# Patient Record
Sex: Male | Born: 1960 | Race: White | Hispanic: No | Smoking: Current every day smoker
Health system: Southern US, Community
[De-identification: ages and names within clinical notes are randomized; demographics above are authoritative.]

## PROBLEM LIST (undated history)

## (undated) DIAGNOSIS — L409 Psoriasis, unspecified: Secondary | ICD-10-CM

## (undated) DIAGNOSIS — F32A Depression, unspecified: Secondary | ICD-10-CM

## (undated) DIAGNOSIS — I509 Heart failure, unspecified: Secondary | ICD-10-CM

## (undated) DIAGNOSIS — Q2112 Patent foramen ovale: Secondary | ICD-10-CM

## (undated) DIAGNOSIS — Q211 Atrial septal defect: Secondary | ICD-10-CM

## (undated) DIAGNOSIS — I4892 Unspecified atrial flutter: Secondary | ICD-10-CM

## (undated) DIAGNOSIS — I499 Cardiac arrhythmia, unspecified: Secondary | ICD-10-CM

## (undated) DIAGNOSIS — J449 Chronic obstructive pulmonary disease, unspecified: Secondary | ICD-10-CM

## (undated) HISTORY — DX: Chronic obstructive pulmonary disease, unspecified: J44.9

## (undated) HISTORY — DX: Unspecified atrial flutter: I48.92

## (undated) HISTORY — DX: Depression, unspecified: F32.A

## (undated) HISTORY — DX: Atrial septal defect: Q21.1

## (undated) HISTORY — DX: Patent foramen ovale: Q21.12

## (undated) HISTORY — PX: HERNIA REPAIR: SHX51

## (undated) HISTORY — DX: Psoriasis, unspecified: L40.9

---

## 2006-03-31 ENCOUNTER — Ambulatory Visit (HOSPITAL_COMMUNITY): Admission: RE | Admit: 2006-03-31 | Discharge: 2006-03-31 | Payer: Self-pay | Admitting: General Surgery

## 2019-01-08 ENCOUNTER — Other Ambulatory Visit: Payer: Self-pay

## 2019-01-08 ENCOUNTER — Emergency Department (HOSPITAL_COMMUNITY): Payer: Self-pay

## 2019-01-08 ENCOUNTER — Ambulatory Visit
Admission: EM | Admit: 2019-01-08 | Discharge: 2019-01-08 | Disposition: A | Discharge status: Home / Self Care | Payer: Self-pay | Attending: Emergency Medicine | Admitting: Emergency Medicine

## 2019-01-08 ENCOUNTER — Observation Stay (HOSPITAL_COMMUNITY)
Admission: EM | Admit: 2019-01-08 | Discharge: 2019-01-10 | Disposition: A | Discharge status: Home / Self Care | Payer: Self-pay | Attending: Family Medicine | Admitting: Family Medicine

## 2019-01-08 ENCOUNTER — Encounter (HOSPITAL_COMMUNITY): Payer: Self-pay | Admitting: Emergency Medicine

## 2019-01-08 ENCOUNTER — Ambulatory Visit (INDEPENDENT_AMBULATORY_CARE_PROVIDER_SITE_OTHER): Payer: Self-pay

## 2019-01-08 DIAGNOSIS — J9601 Acute respiratory failure with hypoxia: Secondary | ICD-10-CM | POA: Insufficient documentation

## 2019-01-08 DIAGNOSIS — Z1159 Encounter for screening for other viral diseases: Secondary | ICD-10-CM | POA: Insufficient documentation

## 2019-01-08 DIAGNOSIS — J441 Chronic obstructive pulmonary disease with (acute) exacerbation: Secondary | ICD-10-CM | POA: Diagnosis present

## 2019-01-08 DIAGNOSIS — R05 Cough: Secondary | ICD-10-CM

## 2019-01-08 DIAGNOSIS — R0602 Shortness of breath: Secondary | ICD-10-CM

## 2019-01-08 DIAGNOSIS — F17210 Nicotine dependence, cigarettes, uncomplicated: Secondary | ICD-10-CM | POA: Insufficient documentation

## 2019-01-08 DIAGNOSIS — Z720 Tobacco use: Secondary | ICD-10-CM | POA: Diagnosis present

## 2019-01-08 DIAGNOSIS — W57XXXA Bitten or stung by nonvenomous insect and other nonvenomous arthropods, initial encounter: Secondary | ICD-10-CM

## 2019-01-08 DIAGNOSIS — R059 Cough, unspecified: Secondary | ICD-10-CM

## 2019-01-08 DIAGNOSIS — J209 Acute bronchitis, unspecified: Secondary | ICD-10-CM | POA: Diagnosis present

## 2019-01-08 DIAGNOSIS — R9389 Abnormal findings on diagnostic imaging of other specified body structures: Secondary | ICD-10-CM

## 2019-01-08 DIAGNOSIS — J9691 Respiratory failure, unspecified with hypoxia: Secondary | ICD-10-CM | POA: Diagnosis present

## 2019-01-08 DIAGNOSIS — F1010 Alcohol abuse, uncomplicated: Secondary | ICD-10-CM | POA: Diagnosis present

## 2019-01-08 DIAGNOSIS — R0902 Hypoxemia: Secondary | ICD-10-CM

## 2019-01-08 LAB — BASIC METABOLIC PANEL
Anion gap: 10 (ref 5–15)
BUN: 5 mg/dL — ABNORMAL LOW (ref 6–20)
CO2: 26 mmol/L (ref 22–32)
Calcium: 8.5 mg/dL — ABNORMAL LOW (ref 8.9–10.3)
Chloride: 94 mmol/L — ABNORMAL LOW (ref 98–111)
Creatinine, Ser: 0.47 mg/dL — ABNORMAL LOW (ref 0.61–1.24)
GFR calc Af Amer: >60 mL/min (ref >60)
GFR calc non Af Amer: >60 mL/min (ref >60)
Glucose, Bld: 111 mg/dL — ABNORMAL HIGH (ref 70–99)
Potassium: 3.6 mmol/L (ref 3.5–5.1)
Sodium: 130 mmol/L — ABNORMAL LOW (ref 135–145)

## 2019-01-08 LAB — CBC
HCT: 47.1 % (ref 39.0–52.0)
Hemoglobin: 16.5 g/dL (ref 13.0–17.0)
MCH: 36.1 pg — ABNORMAL HIGH (ref 26.0–34.0)
MCHC: 35 g/dL (ref 30.0–36.0)
MCV: 103.1 fL — ABNORMAL HIGH (ref 80.0–100.0)
Platelets: 278 10*3/uL (ref 150–400)
RBC: 4.57 MIL/uL (ref 4.22–5.81)
RDW: 12.5 % (ref 11.5–15.5)
WBC: 7.5 10*3/uL (ref 4.0–10.5)
nRBC: 0 % (ref 0.0–0.2)

## 2019-01-08 LAB — TROPONIN I: Troponin I: <0.03 ng/mL (ref <0.03)

## 2019-01-08 LAB — BRAIN NATRIURETIC PEPTIDE: B Natriuretic Peptide: 20 pg/mL (ref 0.0–100.0)

## 2019-01-08 LAB — SARS CORONAVIRUS 2 BY RT PCR (HOSPITAL ORDER, PERFORMED IN ~~LOC~~ HOSPITAL LAB): SARS Coronavirus 2: NEGATIVE

## 2019-01-08 MED ORDER — IPRATROPIUM-ALBUTEROL 0.5-2.5 (3) MG/3ML IN SOLN
3.0000 mL | Freq: Once | RESPIRATORY_TRACT | Status: AC
  Administered 2019-01-08: 3 mL via RESPIRATORY_TRACT
  Filled 2019-01-08: qty 3

## 2019-01-08 MED ORDER — IOHEXOL 350 MG/ML SOLN
100.0000 mL | Freq: Once | INTRAVENOUS | Status: AC | PRN
  Administered 2019-01-08: 100 mL via INTRAVENOUS

## 2019-01-08 MED ORDER — SODIUM CHLORIDE 0.9 % IV SOLN
500.0000 mg | Freq: Once | INTRAVENOUS | Status: AC
  Administered 2019-01-08: 23:00:00 500 mg via INTRAVENOUS
  Filled 2019-01-08: qty 500

## 2019-01-08 MED ORDER — ALBUTEROL SULFATE HFA 108 (90 BASE) MCG/ACT IN AERS
2.0000 | INHALATION_SPRAY | Freq: Once | RESPIRATORY_TRACT | Status: AC
  Administered 2019-01-08: 2 via RESPIRATORY_TRACT
  Filled 2019-01-08: qty 6.7

## 2019-01-08 MED ORDER — PREDNISONE 50 MG PO TABS
60.0000 mg | ORAL_TABLET | Freq: Once | ORAL | Status: AC
  Administered 2019-01-08: 60 mg via ORAL
  Filled 2019-01-08: qty 1

## 2019-01-08 MED ORDER — SODIUM CHLORIDE 0.9 % IV BOLUS
1000.0000 mL | Freq: Once | INTRAVENOUS | Status: AC
  Administered 2019-01-08: 1000 mL via INTRAVENOUS

## 2019-01-08 NOTE — ED Provider Notes (Signed)
[facility] Emergency Department Provider Note MRN:  [id number]  Arrival date & time: 01/08/19     Chief Complaint   Shortness of Breath   History of Present Illness   Jordan Kelly is a 58 y.o. year-old male with a history of tobacco abuse presenting to the ED with chief complaint of shortness of breath.  5 days of progressively worsening shortness of breath.  Worse at night, worse when waking up in the morning.  Worse with exertion.  Denies chest pain.  Endorsing sinus congestion and feeling like he has a sinus infection for the past few days.  Denies fever.  Mild cough, dry.  Denies abdominal pain, no leg pain or swelling.  Symptoms constant, moderate, no other exacerbating or relieving factors.  Chest x-ray performed at urgent care was abnormal, patient was reportedly hypoxic there, sent here for CT imaging.  Review of Systems  A complete 10 system review of systems was obtained and all systems are negative except as noted in the HPI and PMH.   Patient's Health History   History reviewed. No pertinent past medical history.  Past Surgical History:  Procedure Laterality Date  . HERNIA REPAIR      History reviewed. No pertinent family history.  Social History   Socioeconomic History  . Marital status: Divorced    Spouse name: Not on file  . Number of children: Not on file  . Years of education: Not on file  . Highest education level: Not on file  Occupational History  . Not on file  Social Needs  . Financial resource strain: Not on file  . Food insecurity:    Worry: Not on file    Inability: Not on file  . Transportation needs:    Medical: Not on file    Non-medical: Not on file  Tobacco Use  . Smoking status: Current Every Day Smoker    Packs/day: 1.00    Types: Cigarettes  . Smokeless tobacco: Never Used  Substance and Sexual Activity  . Alcohol use: Yes  . Drug use: Yes    Types: Marijuana  . Sexual activity: Not Currently  Lifestyle  .  Physical activity:    Days per week: Not on file    Minutes per session: Not on file  . Stress: Not on file  Relationships  . Social connections:    Talks on phone: Not on file    Gets together: Not on file    Attends religious service: Not on file    Active member of club or organization: Not on file    Attends meetings of clubs or organizations: Not on file    Relationship status: Not on file  . Intimate partner violence:    Fear of current or ex partner: Not on file    Emotionally abused: Not on file    Physically abused: Not on file    Forced sexual activity: Not on file  Other Topics Concern  . Not on file  Social History Narrative  . Not on file     Physical Exam  Vital Signs and Nursing Notes reviewed Vitals:   01/08/19 2118 01/08/19 2240  BP: 136/86 (!) 152/101  Pulse: 85 77  Resp: (!) 23 (!) 21  Temp:    SpO2: 92% 95%    CONSTITUTIONAL: Well-appearing, NAD NEURO:  Alert and oriented x 3, no focal deficits EYES:  eyes equal and reactive ENT/NECK:  no LAD, no JVD CARDIO: Regular rate, well-perfused, normal S1  and S2 PULM: Scattered wheezes, no increased work of breathing GI/GU:  normal bowel sounds, non-distended, non-tender MSK/SPINE:  No gross deformities, no edema SKIN:  no rash, atraumatic PSYCH:  Appropriate speech and behavior  Diagnostic and Interventional Summary    EKG Interpretation  Date/Time:  Friday Jan 08 2019 19:25:22 EDT Ventricular Rate:  84 PR Interval:    QRS Duration: 94 QT Interval:  372 QTC Calculation: 440 R Axis:   81 Text Interpretation:  Sinus rhythm Consider left atrial enlargement Minimal ST elevation, inferior leads Confirmed by Gerlene Fee [phone]) on 01/08/2019 9:15:03 PM      Labs Reviewed  CBC - Abnormal; Notable for the following components:      Result Value   MCV 103.1 (*)    MCH 36.1 (*)    All other components within normal limits  BASIC METABOLIC PANEL - Abnormal; Notable for the following components:    Sodium 130 (*)    Chloride 94 (*)    Glucose, Bld 111 (*)    BUN 5 (*)    Creatinine, Ser 0.47 (*)    Calcium 8.5 (*)    All other components within normal limits  SARS CORONAVIRUS 2 (HOSPITAL ORDER, [facility])  TROPONIN I  BRAIN NATRIURETIC PEPTIDE    CT ANGIO CHEST PE W OR WO CONTRAST  Final Result      Medications  ipratropium-albuterol (DUONEB) 0.5-2.5 (3) MG/3ML nebulizer solution 3 mL (has no administration in time range)  ipratropium-albuterol (DUONEB) 0.5-2.5 (3) MG/3ML nebulizer solution 3 mL (has no administration in time range)  azithromycin (ZITHROMAX) 500 mg in sodium chloride 0.9 % 250 mL IVPB (has no administration in time range)  albuterol (VENTOLIN HFA) 108 (90 Base) MCG/ACT inhaler 2 puff (2 puffs Inhalation Given 01/08/19 1936)  predniSONE (DELTASONE) tablet 60 mg (60 mg Oral Given 01/08/19 1936)  iohexol (OMNIPAQUE) 350 MG/ML injection 100 mL (100 mLs Intravenous Contrast Given 01/08/19 2132)     Procedures Critical Care Critical Care Documentation Critical care time provided by me (excluding procedures): 32 minutes  Condition necessitating critical care: COPD exacerbation with hypoxic respiratory failure  Components of critical care management: reviewing of prior records, laboratory and imaging interpretation, frequent re-examination and reassessment of vital signs, administration of DuoNeb nebulization therapy, IV azithromycin, IV fluids, supplemental oxygen, discussion with consulting services    ED Course and Medical Decision Making  I have reviewed the triage vital signs and the nursing notes.  Pertinent labs & imaging results that were available during my care of the patient were reviewed by me and considered in my medical decision making (see below for details).  Favoring COPD exacerbation in this 58 year old male with long-term smoking history, no formal diagnosis.  Also considering pneumonia, coronavirus, less likely  pulmonary embolism work-up pending.  Jordan Kelly was evaluated in Emergency Department on 01/08/2019 for the symptoms described in the history of present illness. He was evaluated in the context of the global COVID-19 pandemic, which necessitated consideration that the patient might be at risk for infection with the SARS-CoV-2 virus that causes COVID-19. Institutional protocols and algorithms that pertain to the evaluation of patients at risk for COVID-19 are in a state of rapid change based on information released by regulatory bodies including the CDC and federal and state organizations. These policies and algorithms were followed during the patient's care in the ED.  Work-up reveals hyponatremia with a sodium of 130, normal BNP, negative troponin, CTA without evidence of PE,  no clear evidence of pneumonia.  Suspect underlying and undiagnosed COPD with exacerbation.  Patient is now on 4 L nasal cannula to maintain saturations in the mid 90s.  Admitted to hospitalist service for further care.  Patient is COVID negative, can now provide DuoNeb nebulizer therapy.  Barth Kirks. [place]onia Small, [place] [email or web address]  Final Clinical Impressions(s) / ED Diagnoses     ICD-10-CM   1. COPD exacerbation ([place]) J44.1   2. SOB (shortness of breath) R06.02 CANCELED: DG Chest Port 1 View    CANCELED: DG Chest Port 1 View  3. Acute respiratory failure with hypoxia [facility]) J96.01     ED Discharge Orders    None         Maudie Flakes, MD 01/08/19 2250

## 2019-01-08 NOTE — ED Provider Notes (Signed)
[facility]   [id number] 01/08/19 Arrival Time: 1536   CC: nasal congestion, cough, SOB, and tick bite  SUBJECTIVE: History from: patient.  Jordan Kelly is a 58 y.o. male hx significant for tobacco use, 1 PPD x 20 years, who presents with nasal congestion x 2 weeks and nonproductive cough, and SOB x 3 days.  Denies sick exposure to COVID, flu or strep.  Denies recent travel.  Has tried OTC mucinex without relief.  Symptoms are made worse at night.  Reports previous symptoms in the past that improved with albuterol inhaler.   Complains of associated fatigue, rhinorrhea, sinus congestion/ pain, and wheezing.  Denies fever, chills, sore throat, chest pain, nausea, changes in bowel or bladder habits.    Removed tick from left lower extremity 2 days ago.  States it was not engorged, and was present for less than 72 hours.  Denies nausea, vomiting, dizziness, weakness, rash, or abdominal pain.    ROS: As per HPI.  History reviewed. No pertinent past medical history. Past Surgical History:  Procedure Laterality Date  . HERNIA REPAIR     No Known Allergies No current facility-administered medications on file prior to encounter.    No current outpatient medications on file prior to encounter.   Social History   Socioeconomic History  . Marital status: Divorced    Spouse name: Not on file  . Number of children: Not on file  . Years of education: Not on file  . Highest education level: Not on file  Occupational History  . Not on file  Social Needs  . Financial resource strain: Not on file  . Food insecurity:    Worry: Not on file    Inability: Not on file  . Transportation needs:    Medical: Not on file    Non-medical: Not on file  Tobacco Use  . Smoking status: Current Every Day Smoker    Packs/day: 1.00    Types: Cigarettes  . Smokeless tobacco: Never Used  Substance and Sexual Activity  . Alcohol use: Yes  . Drug use: Yes    Types: Marijuana  . Sexual  activity: Not Currently  Lifestyle  . Physical activity:    Days per week: Not on file    Minutes per session: Not on file  . Stress: Not on file  Relationships  . Social connections:    Talks on phone: Not on file    Gets together: Not on file    Attends religious service: Not on file    Active member of club or organization: Not on file    Attends meetings of clubs or organizations: Not on file    Relationship status: Not on file  . Intimate partner violence:    Fear of current or ex partner: Not on file    Emotionally abused: Not on file    Physically abused: Not on file    Forced sexual activity: Not on file  Other Topics Concern  . Not on file  Social History Narrative  . Not on file   History reviewed. No pertinent family history.  OBJECTIVE:  Vitals:   01/08/19 1553  BP: (!) 144/89  Pulse: 76  Resp: 20  Temp: 98.1 F (36.7 C)  SpO2: (!) 89%     General appearance: alert, appears older than stated age; fatigued, but nontoxic; speaking in full sentences and tolerating own secretions HEENT: NCAT; Ears: EACs clear, TMs pearly gray; Eyes: PERRL.  EOM grossly intact. Sinuses: NTTP; Nose:  nares patent with clear rhinorrhea, Throat: oropharynx clear, tonsils non erythematous or enlarged, uvula midline  Neck: supple without LAD Lungs: wheezes and rales heard throughout bilateral lungs; cough present Heart: regular rate and rhythm.  Radial pulses 2+ symmetrical bilaterally.  Cap refill <2 seconds Skin: warm and dry; small punctate lesion without erythema appreciated after removal.  Cleansed again with alcohol swab and band-aid placed. Psychological: alert and cooperative; normal mood and affect  DIAGNOSTIC STUDIES:  Dg Chest 2 View  Result Date: 01/08/2019 CLINICAL DATA:  Cough, fatigue, short of breath EXAM: CHEST - 2 VIEW COMPARISON:  None. FINDINGS: Normal cardiac silhouette. Intermediate density opacity projecting over the RIGHT costophrenic angle. Upper lungs are  clear. LEFT lung base clear. Lateral projection demonstrates no focal abnormality. Degenerate spurring of spine. IMPRESSION: RIGHT lateral lung base opacity represent scarring, pneumonia, or pleural lesion. Recommend clinical correlation and consider CT thorax. These results will be called to the ordering clinician or representative by the Radiologist Assistant, and communication documented in the PACS or zVision Dashboard. Electronically Signed   By: Suzy Bouchard M.D.   On: 01/08/2019 16:29     ASSESSMENT & PLAN:  1. Hypoxia   2. Cough   3. Shortness of breath   4. Tick bite, initial encounter   5. Abnormal chest x-ray    Recommending further evaluation and management in the ED for hypoxia and concerning findings on chest x-ray.  Radiologist is recommending CT scan.  Patient aware and in agreement with this plan.  Will go by private vehicle to [facility] ED.           Lestine Box, PA-C 01/08/19 1825

## 2019-01-08 NOTE — ED Notes (Signed)
Pt's O2 sat was 89% on RA. Placed pt on 2LPM via [place] with EDP approval at bedside. O2 sat % increased to 95%

## 2019-01-08 NOTE — ED Triage Notes (Signed)
Pt states that he has had nasal congestion for past 2 weeks and began having cough at night for past 3 days . States it is nonproductive

## 2019-01-08 NOTE — ED Notes (Signed)
Pt's O2 sat % was 88% when this RN walked into room. Increased O2 up to 4LPM, pt's O2 sat 95 %.

## 2019-01-08 NOTE — Discharge Instructions (Addendum)
Recommending further evaluation and management in the ED for hypoxia and concerning findings on chest x-ray.  Radiologist is recommending CT scan.  Patient aware and in agreement with this plan.  Will go by private vehicle to [facility] ED.

## 2019-01-08 NOTE — ED Triage Notes (Signed)
Patient was seen at the urgent care for headaches and shortness of breath. Urgent care sent patient here after a chest x-ray was done and recommended a CT scan. Urgent care stated that the patient may have possible pneumonia. Patient has low oxygen saturations at 91 percent on room air.

## 2019-01-08 NOTE — H&P (Addendum)
TRH H&P    Patient Demographics:    Jordan Kelly, is a 58 y.o. male  MRN: [id number]  DOB - August 06, 1961  Admit Date - 01/08/2019  Referring MD/NP/PA: Dr. Sedonia Small  Outpatient Primary MD for the patient is Patient, No Pcp Per  Patient coming from: Home  Chief complaint-shortness of breath   HPI:    Jordan Kelly  is a 58 y.o. male, with history of tobacco abuse, alcohol abuse came to ED with chief complaint of shortness of breath.  Patient states that for 5 days he has been having worsening shortness of breath also had nasal congestion.  Denies nausea vomiting or diarrhea.  Denies coughing up any phlegm.  Complains of mild dry cough.  Chest x-ray performed at urgent care was abnormal and was reportedly hypoxic so he was sent to the ED for CT imaging. Patient also complains of tick bite in left leg. In the ED CTA chest was negative for pulmonary embolism, showed small left pleural effusion with atelectasis and no consolidation. SARS-CoV-2 was negative Denies chest pain No previous history of stroke or seizures.    Review of systems:    In addition to the HPI above,    All other systems reviewed and are negative.    Past History of the following :    History reviewed. No pertinent past medical history.    Past Surgical History:  Procedure Laterality Date  . HERNIA REPAIR        Social History:      Social History   Tobacco Use  . Smoking status: Current Every Day Smoker    Packs/day: 1.00    Types: Cigarettes  . Smokeless tobacco: Never Used  Substance Use Topics  . Alcohol use: Yes       Family History :  Unremarkable   Home Medications:   Prior to Admission medications   Not on File     Allergies:    No Known Allergies   Physical Exam:   Vitals  Blood pressure (!) 152/101, pulse 77, temperature 98.2 F (36.8 C), temperature source Oral, resp. rate (!) 21, height 5\' 11"   (1.803 m), weight 81.6 kg, SpO2 92 %.  1.  General: Appears in no acute distress  2. Psychiatric: Alert, oriented x3, intact insight and judgment  3. Neurologic: Cranial nerves II through grossly intact, motor strength 5/5 in all extremities  4. HEENMT:  Atraumatic normocephalic, extraocular muscles are intact  5. Respiratory : Wheezing auscultated bilaterally  6. Cardiovascular : S1-S2, regular, no murmur auscultated  7. Gastrointestinal:  Abdomen is soft, nontender, no organomegaly  8. Skin:  Left lower extremity has small erythematous lesion      Data Review:    CBC Recent Labs  Lab 01/08/19 1941  WBC 7.5  HGB 16.5  HCT 47.1  PLT 278  MCV 103.1*  MCH 36.1*  MCHC 35.0  RDW 12.5   ------------------------------------------------------------------------------------------------------------------  Results for orders placed or performed during the hospital encounter of 01/08/19 (from the past 48 hour(s))  CBC  Status: Abnormal   Collection Time: 01/08/19  7:41 PM  Result Value Ref Range   WBC 7.5 4.0 - 10.5 K/uL   RBC 4.57 4.22 - 5.81 MIL/uL   Hemoglobin 16.5 13.0 - 17.0 g/dL   HCT 47.1 39.0 - 52.0 %   MCV 103.1 (H) 80.0 - 100.0 fL   MCH 36.1 (H) 26.0 - 34.0 pg   MCHC 35.0 30.0 - 36.0 g/dL   RDW 12.5 11.5 - 15.5 %   Platelets 278 150 - 400 K/uL   nRBC 0.0 0.0 - 0.2 %    Comment: Performed at [facility], [address], [place], [place] [address]  Basic metabolic panel     Status: Abnormal   Collection Time: 01/08/19  7:41 PM  Result Value Ref Range   Sodium 130 (L) 135 - 145 mmol/L   Potassium 3.6 3.5 - 5.1 mmol/L   Chloride 94 (L) 98 - 111 mmol/L   CO2 26 22 - 32 mmol/L   Glucose, Bld 111 (H) 70 - 99 mg/dL   BUN 5 (L) 6 - 20 mg/dL   Creatinine, Ser 0.47 (L) 0.61 - 1.24 mg/dL   Calcium 8.5 (L) 8.9 - 10.3 mg/dL   GFR calc non Af Amer >60 >60 mL/min   GFR calc Af Amer >60 >60 mL/min   Anion gap 10 5 - 15    Comment: Performed at [facility], [address], [place], [place] [address]  Troponin I - ONCE - STAT     Status: None   Collection Time: 01/08/19  7:41 PM  Result Value Ref Range   Troponin I <0.03 <0.03 ng/mL    Comment: Performed at [facility], [address] [place], [place] [address]  Brain natriuretic peptide     Status: None   Collection Time: 01/08/19  7:42 PM  Result Value Ref Range   B Natriuretic Peptide 20.0 0.0 - 100.0 pg/mL    Comment: Performed at [facility], [address], [place], [place] [address]  SARS Coronavirus 2 (CEPHEID- Performed in [facility] hospital lab), Hosp Order     Status: None   Collection Time: 01/08/19  7:44 PM  Result Value Ref Range   SARS Coronavirus 2 NEGATIVE NEGATIVE    Comment: (NOTE) If result is NEGATIVE SARS-CoV-2 target nucleic acids are NOT DETECTED. The SARS-CoV-2 RNA is generally detectable in upper and lower  respiratory specimens during the acute phase of infection. The lowest  concentration of SARS-CoV-2 viral copies this assay can detect is 250  copies / mL. A negative result does not preclude SARS-CoV-2 infection  and should not be used as the sole basis for treatment or other  patient management decisions.  A negative result may occur with  improper specimen collection / handling, submission of specimen other  than nasopharyngeal swab, presence of viral mutation(s) within the  areas targeted by this assay, and inadequate number of viral copies  (<250 copies / mL). A negative result must be combined with clinical  observations, patient history, and epidemiological information. If result is POSITIVE SARS-CoV-2 target nucleic acids are DETECTED. The SARS-CoV-2 RNA is generally detectable in upper and lower  respiratory specimens dur ing the acute phase of infection.  Positive  results are indicative of active infection with SARS-CoV-2.  Clinical  correlation with patient history and other diagnostic information is  necessary to determine patient  infection status.  Positive results do  not rule out bacterial infection or co-infection with other viruses. If  result is PRESUMPTIVE POSTIVE SARS-CoV-2 nucleic acids MAY BE PRESENT.   A presumptive positive result was obtained on the submitted specimen  and confirmed on repeat testing.  While 2019 novel coronavirus  (SARS-CoV-2) nucleic acids may be present in the submitted sample  additional confirmatory testing may be necessary for epidemiological  and / or clinical management purposes  to differentiate between  SARS-CoV-2 and other Sarbecovirus currently known to infect humans.  If clinically indicated additional testing with an alternate test  methodology [phone]) is advised. The SARS-CoV-2 RNA is generally  detectable in upper and lower respiratory sp ecimens during the acute  phase of infection. The expected result is Negative. Fact Sheet for Patients:  StrictlyIdeas.no Fact Sheet for Healthcare Providers: BankingDealers.co.za This test is not yet approved or cleared by the [place] FDA and has been authorized for detection and/or diagnosis of SARS-CoV-2 by FDA under an Emergency Use Authorization (EUA).  This EUA will remain in effect (meaning this test can be used) for the duration of the COVID-19 declaration under Section 564(b)(1) of the Act, 21 U.S.C. section 360bbb-3(b)(1), unless the authorization is terminated or revoked sooner. Performed at [facility], [address] [place], [place] [address]     Chemistries  Recent Labs  Lab 01/08/19 1941  NA 130*  K 3.6  CL 94*  CO2 26  GLUCOSE 111*  BUN 5*  CREATININE 0.47*  CALCIUM 8.5*   ------------------------------------------------------------------------------------------------------------------  ------------------------------------------------------------------------------------------------------------------ GFR: Estimated Creatinine Clearance: 111.1  mL/min (A) (by C-G formula based on SCr of 0.47 mg/dL (L)). Liver Function Tests: No results for input(s): AST, ALT, ALKPHOS, BILITOT, PROT, ALBUMIN in the last 168 hours. No results for input(s): LIPASE, AMYLASE in the last 168 hours. No results for input(s): AMMONIA in the last 168 hours. Coagulation Profile: No results for input(s): INR, PROTIME in the last 168 hours. Cardiac Enzymes: Recent Labs  Lab 01/08/19 1941  TROPONINI <0.03      Imaging Results:    Dg Chest 2 View  Result Date: 01/08/2019 CLINICAL DATA:  Cough, fatigue, short of breath EXAM: CHEST - 2 VIEW COMPARISON:  None. FINDINGS: Normal cardiac silhouette. Intermediate density opacity projecting over the RIGHT costophrenic angle. Upper lungs are clear. LEFT lung base clear. Lateral projection demonstrates no focal abnormality. Degenerate spurring of spine. IMPRESSION: RIGHT lateral lung base opacity represent scarring, pneumonia, or pleural lesion. Recommend clinical correlation and consider CT thorax. These results will be called to the ordering clinician or representative by the Radiologist Assistant, and communication documented in the PACS or zVision Dashboard. Electronically Signed   By: Suzy Bouchard M.D.   On: 01/08/2019 16:29   Ct Angio Chest Pe W Or Wo Contrast  Result Date: 01/08/2019 CLINICAL DATA:  Headaches and shortness of breath with hypoxia EXAM: CT ANGIOGRAPHY CHEST WITH CONTRAST TECHNIQUE: Multidetector CT imaging of the chest was performed using the standard protocol during bolus administration of intravenous contrast. Multiplanar CT image reconstructions and MIPs were obtained to evaluate the vascular anatomy. CONTRAST:  163mL OMNIPAQUE IOHEXOL 350 MG/ML SOLN COMPARISON:  None. FINDINGS: Cardiovascular: --Pulmonary arteries: Contrast injection is sufficient to demonstrate satisfactory opacification of the pulmonary arteries to the segmental level, with attenuation of 325 HU at the main pulmonary artery.  There is no pulmonary embolus. The main pulmonary artery is within normal limits for size. --Aorta: Satisfactory opacification of the thoracic aorta. No aortic dissection or other acute aortic syndrome. Conventional 3 vessel aortic branching pattern. There is no aortic atherosclerosis. --Heart: Normal size. No  pericardial effusion. Mediastinum/Nodes: No mediastinal, hilar or axillary lymphadenopathy. The visualized thyroid and thoracic esophageal course are unremarkable. Lungs/Pleura: Small right basilar pleural effusion. No focal consolidation. No pneumothorax. 4 mm left lung base nodule. Upper Abdomen: Contrast bolus timing is not optimized for evaluation of the abdominal organs. The visualized portions of the organs of the upper abdomen are normal. Musculoskeletal: No chest wall abnormality. No bony spinal canal stenosis. Review of the MIP images confirms the above findings. IMPRESSION: 1. No pulmonary embolus or acute aortic syndrome. 2. Small right pleural effusion with basilar atelectasis. 3. 3 mm left lung base pulmonary nodule. No follow-up needed if patient is low-risk. Non-contrast chest CT can be considered in 12 months if patient is high-risk. This recommendation follows the consensus statement: Guidelines for Management of Incidental Pulmonary Nodules Detected on CT Images: From the Fleischner Society 2017; Radiology 2017; 284:228-243. Electronically Signed   By: Ulyses Jarred M.D.   On: 01/08/2019 22:42    My personal review of EKG: Rhythm NSR, nonspecific ST changes in leads II, III and aVF   Assessment & Plan:    Active Problems:   Acute bronchitis   1. Acute bronchitis-patient presenting with symptoms of acute bronchitis, nasal congestion.  May have underlying COPD.  He has longstanding history of tobacco abuse. Will check respiratory virus panel. Will start Solumedrol 40 mg IV Q 6 hr, Duoneb q 6 hrs, will start Doxycycline 100 mg po bid.  2. Tick bite- has small erythematous lesion in  left leg.No signs and symptoms of RMSF, started on Doxycycline.  3. Alcohol abuse- will start CIWA protocol   DVT Prophylaxis-   Lovenox   AM Labs Ordered, also please review Full Orders  Family Communication: Admission, patients condition and plan of care including tests being ordered have been discussed with the patient  who indicate understanding and agree with the plan and Code Status.  Code Status: Full code  Admission status: Observation: Based on patients clinical presentation and evaluation of above clinical data, I have made determination that patient meets Inpatient criteria at this time.  Time spent in minutes : 60 minutes   Oswald Hillock M.D on 01/08/2019 at 11:32 PM         ;

## 2019-01-09 ENCOUNTER — Encounter (HOSPITAL_COMMUNITY): Payer: Self-pay | Admitting: *Deleted

## 2019-01-09 ENCOUNTER — Other Ambulatory Visit: Payer: Self-pay

## 2019-01-09 DIAGNOSIS — J441 Chronic obstructive pulmonary disease with (acute) exacerbation: Secondary | ICD-10-CM | POA: Diagnosis present

## 2019-01-09 DIAGNOSIS — F1010 Alcohol abuse, uncomplicated: Secondary | ICD-10-CM | POA: Diagnosis present

## 2019-01-09 DIAGNOSIS — J440 Chronic obstructive pulmonary disease with acute lower respiratory infection: Secondary | ICD-10-CM

## 2019-01-09 DIAGNOSIS — J209 Acute bronchitis, unspecified: Secondary | ICD-10-CM | POA: Diagnosis present

## 2019-01-09 DIAGNOSIS — J9691 Respiratory failure, unspecified with hypoxia: Secondary | ICD-10-CM | POA: Diagnosis present

## 2019-01-09 DIAGNOSIS — Z720 Tobacco use: Secondary | ICD-10-CM | POA: Diagnosis present

## 2019-01-09 LAB — RESPIRATORY PANEL BY PCR

## 2019-01-09 LAB — COMPREHENSIVE METABOLIC PANEL
ALT: 29 U/L (ref 0–44)
AST: 23 U/L (ref 15–41)
Albumin: 3.6 g/dL (ref 3.5–5.0)
Alkaline Phosphatase: 108 U/L (ref 38–126)
Anion gap: 14 (ref 5–15)
BUN: 6 mg/dL (ref 6–20)
CO2: 25 mmol/L (ref 22–32)
Calcium: 8.7 mg/dL — ABNORMAL LOW (ref 8.9–10.3)
Chloride: 100 mmol/L (ref 98–111)
Creatinine, Ser: 0.5 mg/dL — ABNORMAL LOW (ref 0.61–1.24)
GFR calc Af Amer: >60 mL/min (ref >60)
GFR calc non Af Amer: >60 mL/min (ref >60)
Glucose, Bld: 146 mg/dL — ABNORMAL HIGH (ref 70–99)
Potassium: 4.3 mmol/L (ref 3.5–5.1)
Sodium: 139 mmol/L (ref 135–145)
Total Bilirubin: 0.7 mg/dL (ref 0.3–1.2)
Total Protein: 7.4 g/dL (ref 6.5–8.1)

## 2019-01-09 LAB — CBC
HCT: 51 % (ref 39.0–52.0)
Hemoglobin: 16.8 g/dL (ref 13.0–17.0)
MCH: 34.3 pg — ABNORMAL HIGH (ref 26.0–34.0)
MCHC: 32.9 g/dL (ref 30.0–36.0)
MCV: 104.1 fL — ABNORMAL HIGH (ref 80.0–100.0)
Platelets: 269 10*3/uL (ref 150–400)
RBC: 4.9 MIL/uL (ref 4.22–5.81)
RDW: 12.6 % (ref 11.5–15.5)
WBC: 4.5 10*3/uL (ref 4.0–10.5)
nRBC: 0 % (ref 0.0–0.2)

## 2019-01-09 MED ORDER — LORAZEPAM 1 MG PO TABS: 1.0000 mg | ORAL_TABLET | Freq: Four times a day (QID) | ORAL | Status: DC | PRN

## 2019-01-09 MED ORDER — ALBUTEROL SULFATE (2.5 MG/3ML) 0.083% IN NEBU: 2.5000 mg | INHALATION_SOLUTION | RESPIRATORY_TRACT | Status: DC | PRN

## 2019-01-09 MED ORDER — LORAZEPAM 1 MG PO TABS
1.0000 mg | ORAL_TABLET | Freq: Once | ORAL | Status: AC
  Administered 2019-01-09: 1 mg via ORAL
  Filled 2019-01-09: qty 1

## 2019-01-09 MED ORDER — THIAMINE HCL 100 MG/ML IJ SOLN: 100.0000 mg | Freq: Every day | INTRAMUSCULAR | Status: DC

## 2019-01-09 MED ORDER — METHYLPREDNISOLONE SODIUM SUCC 40 MG IJ SOLR
40.0000 mg | Freq: Two times a day (BID) | INTRAMUSCULAR | Status: DC
  Administered 2019-01-09: 40 mg via INTRAVENOUS
  Filled 2019-01-09: qty 1

## 2019-01-09 MED ORDER — DOXYCYCLINE HYCLATE 100 MG PO TABS
100.0000 mg | ORAL_TABLET | Freq: Two times a day (BID) | ORAL | Status: DC
  Administered 2019-01-09 – 2019-01-10 (×4): 100 mg via ORAL
  Filled 2019-01-09 (×4): qty 1

## 2019-01-09 MED ORDER — METHYLPREDNISOLONE SODIUM SUCC 40 MG IJ SOLR
40.0000 mg | Freq: Four times a day (QID) | INTRAMUSCULAR | Status: DC
  Administered 2019-01-09 (×3): 40 mg via INTRAVENOUS
  Filled 2019-01-09 (×3): qty 1

## 2019-01-09 MED ORDER — FOLIC ACID 1 MG PO TABS
1.0000 mg | ORAL_TABLET | Freq: Every day | ORAL | Status: DC
  Administered 2019-01-09 – 2019-01-10 (×2): 1 mg via ORAL
  Filled 2019-01-09 (×2): qty 1

## 2019-01-09 MED ORDER — VITAMIN B-1 100 MG PO TABS
100.0000 mg | ORAL_TABLET | Freq: Every day | ORAL | Status: DC
  Administered 2019-01-09 – 2019-01-10 (×2): 100 mg via ORAL
  Filled 2019-01-09 (×2): qty 1

## 2019-01-09 MED ORDER — IPRATROPIUM BROMIDE 0.02 % IN SOLN
0.5000 mg | Freq: Four times a day (QID) | RESPIRATORY_TRACT | Status: DC
  Administered 2019-01-09: 0.5 mg via RESPIRATORY_TRACT
  Filled 2019-01-09: qty 2.5

## 2019-01-09 MED ORDER — IPRATROPIUM-ALBUTEROL 0.5-2.5 (3) MG/3ML IN SOLN
3.0000 mL | Freq: Four times a day (QID) | RESPIRATORY_TRACT | Status: DC
  Administered 2019-01-09 – 2019-01-10 (×5): 3 mL via RESPIRATORY_TRACT
  Filled 2019-01-09 (×5): qty 3

## 2019-01-09 MED ORDER — SODIUM CHLORIDE 0.9 % IV SOLN
INTRAVENOUS | Status: DC
  Administered 2019-01-09: 01:00:00 via INTRAVENOUS

## 2019-01-09 MED ORDER — ENOXAPARIN SODIUM 40 MG/0.4ML ~~LOC~~ SOLN
40.0000 mg | SUBCUTANEOUS | Status: DC
  Administered 2019-01-09 – 2019-01-10 (×2): 40 mg via SUBCUTANEOUS
  Filled 2019-01-09 (×3): qty 0.4

## 2019-01-09 MED ORDER — ADULT MULTIVITAMIN W/MINERALS CH
1.0000 | ORAL_TABLET | Freq: Every day | ORAL | Status: DC
  Administered 2019-01-09 – 2019-01-10 (×2): 1 via ORAL
  Filled 2019-01-09 (×2): qty 1

## 2019-01-09 MED ORDER — GUAIFENESIN ER 600 MG PO TB12
600.0000 mg | ORAL_TABLET | Freq: Two times a day (BID) | ORAL | Status: DC
  Administered 2019-01-09 – 2019-01-10 (×4): 600 mg via ORAL
  Filled 2019-01-09 (×4): qty 1

## 2019-01-09 MED ORDER — ALBUTEROL SULFATE (2.5 MG/3ML) 0.083% IN NEBU
2.5000 mg | INHALATION_SOLUTION | Freq: Four times a day (QID) | RESPIRATORY_TRACT | Status: DC
  Administered 2019-01-09: 2.5 mg via RESPIRATORY_TRACT
  Filled 2019-01-09: qty 3

## 2019-01-09 MED ORDER — LORAZEPAM 2 MG/ML IJ SOLN: 1.0000 mg | Freq: Four times a day (QID) | INTRAMUSCULAR | Status: DC | PRN

## 2019-01-09 NOTE — Progress Notes (Addendum)
MD took off patient's O2 and wanted nursing staff to check his sat's. Initially it was 87% and then came up to 90% on room air just at rest. MD notified and stated not to try and ambulate patient. O2 re-applied.

## 2019-01-09 NOTE — Progress Notes (Signed)
Sputum cup placed in room

## 2019-01-09 NOTE — Progress Notes (Signed)
Patient Demographics:    Jordan Kelly, is a 58 y.o. male, DOB - [birth year], UTM:[id number]  Admit date - 01/08/2019   Admitting Physician Oswald Hillock, MD  Outpatient Primary MD for the patient is Patient, No Pcp Per  LOS - 0   Chief Complaint  Patient presents with  . Shortness of Breath        Subjective:    Jordan Kelly today has no fevers, no emesis,  No chest pain, cough, congestion and shortness of breath persist, unable to wean patient off oxygen,  Assessment  & Plan :    Principal Problem:   COPD with acute bronchitis/rhinovirus Active Problems:   COPD with acute exacerbation (HCC)   Tobacco abuse   Alcohol abuse   Acute Respiratory failure with hypoxia [facility])  Brief Summary- 58 y.o. male, with history of tobacco abuse, alcohol abuse admitted with respiratory symptoms and hypoxia on 01/08/2019  A/p 1)Acute COPD Exacerbation- no definite pneumonia,  treat empirically with IV Solu-Medrol   give mucinex, c/n doxycycline and bronchodilators as ordered, supplemental oxygen as ordered.   2)Acute Hypoxic Resp Failure--- unable to wean off oxygen at this time, hypoxia secondary to #1 above  3)Alcohol abuse--- lorazepam per CIWA protocol, continue folic acid and multivitamin  4)Tobacco and THC abuse--- patient with almost 40 pack years, is not ready to quit smoking  Disposition/Need for in-Hospital Stay- patient unable to be discharged at this time due to persistent respiratory symptoms and hypoxia  Code Status : FULL  Family Communication:   na   Disposition Plan  : home  Consults  :  na   DVT Prophylaxis  :  Lovenox   Lab Results  Component Value Date   PLT 269 01/09/2019    Inpatient Medications  Scheduled Meds: . doxycycline  100 mg Oral Q12H  . enoxaparin (LOVENOX) injection  40 mg Subcutaneous Q24H  . folic acid  1 mg Oral Daily  . guaiFENesin  600 mg Oral BID  .  ipratropium-albuterol  3 mL Nebulization Q6H  . [START ON 01/10/2019] methylPREDNISolone (SOLU-MEDROL) injection  40 mg Intravenous Q12H  . multivitamin with minerals  1 tablet Oral Daily  . thiamine  100 mg Oral Daily   Or  . thiamine  100 mg Intravenous Daily   Continuous Infusions: . sodium chloride 10 mL/hr at 01/09/19 0300   PRN Meds:.albuterol, LORazepam **OR** LORazepam    Anti-infectives (From admission, onward)   Start     Dose/Rate Route Frequency Ordered Stop   01/09/19 0015  doxycycline (VIBRA-TABS) tablet 100 mg     100 mg Oral Every 12 hours 01/09/19 0008     01/08/19 2300  azithromycin (ZITHROMAX) 500 mg in sodium chloride 0.9 % 250 mL IVPB     500 mg 250 mL/hr over 60 Minutes Intravenous  Once 01/08/19 2246 01/09/19 0002        Objective:   Vitals:   01/09/19 1022 01/09/19 1200 01/09/19 1238 01/09/19 1402  BP:  127/68 121/69   Pulse:  (!) 102 (!) 102   Resp:  20 20   Temp:  98 F (36.7 C) 98 F (36.7 C)   TempSrc:  Oral Oral   SpO2: (!) 87% 90% (!) 87% 90%  Weight:  Height:        Wt Readings from Last 3 Encounters:  01/09/19 78.3 kg     Intake/Output Summary (Last 24 hours) at 01/09/2019 1516 Last data filed at 01/09/2019 1300 Gross per 24 hour  Intake 859.65 ml  Output 2500 ml  Net -1640.35 ml     Physical Exam Patient is examined daily including today on 01/09/19 , exams remain the same as of yesterday except that has changed   Gen:- Awake Alert,  In no apparent distress  HEENT:- [place].AT, No sclera icterus Nose- [place] 2 L/min Neck-Supple Neck,No JVD,.  Lungs-diminished breath sounds with scattered wheezes  CV- S1, S2 normal, regular  Abd-  +ve B.Sounds, Abd Soft, No tenderness,    Extremity/Skin:- No  edema, pedal pulses present  Psych-affect is appropriate, oriented x3 Neuro-no new focal deficits, no tremors   Data Review:   Micro Results Recent Results (from the past 240 hour(s))  SARS Coronavirus 2 (CEPHEID- Performed in [place] hospital lab), Hosp Order     Status: None   Collection Time: 01/08/19  7:44 PM  Result Value Ref Range Status   SARS Coronavirus 2 NEGATIVE NEGATIVE Final    Comment: (NOTE) If result is NEGATIVE SARS-CoV-2 target nucleic acids are NOT DETECTED. The SARS-CoV-2 RNA is generally detectable in upper and lower  respiratory specimens during the acute phase of infection. The lowest  concentration of SARS-CoV-2 viral copies this assay can detect is 250  copies / mL. A negative result does not preclude SARS-CoV-2 infection  and should not be used as the sole basis for treatment or other  patient management decisions.  A negative result may occur with  improper specimen collection / handling, submission of specimen other  than nasopharyngeal swab, presence of viral mutation(s) within the  areas targeted by this assay, and inadequate number of viral copies  (<250 copies / mL). A negative result must be combined with clinical  observations, patient history, and epidemiological information. If result is POSITIVE SARS-CoV-2 target nucleic acids are DETECTED. The SARS-CoV-2 RNA is generally detectable in upper and lower  respiratory specimens dur ing the acute phase of infection.  Positive  results are indicative of active infection with SARS-CoV-2.  Clinical  correlation with patient history and other diagnostic information is  necessary to determine patient infection status.  Positive results do  not rule out bacterial infection or co-infection with other viruses. If result is PRESUMPTIVE POSTIVE SARS-CoV-2 nucleic acids MAY BE PRESENT.   A presumptive positive result was obtained on the submitted specimen  and confirmed on repeat testing.  While 2019 novel coronavirus  (SARS-CoV-2) nucleic acids may be present in the submitted sample  additional confirmatory testing may be necessary for epidemiological  and / or clinical management purposes  to differentiate between  SARS-CoV-2 and  other Sarbecovirus currently known to infect humans.  If clinically indicated additional testing with an alternate test  methodology [phone]) is advised. The SARS-CoV-2 RNA is generally  detectable in upper and lower respiratory sp ecimens during the acute  phase of infection. The expected result is Negative. Fact Sheet for Patients:  StrictlyIdeas.no Fact Sheet for Healthcare Providers: BankingDealers.co.za This test is not yet approved or cleared by the [place] FDA and has been authorized for detection and/or diagnosis of SARS-CoV-2 by FDA under an Emergency Use Authorization (EUA).  This EUA will remain in effect (meaning this test can be used) for the duration of the COVID-19 declaration under Section 564(b)(1) of the  Act, 21 U.S.C. section 360bbb-3(b)(1), unless the authorization is terminated or revoked sooner. Performed at [facility], [address] [place], [place] [address]   Respiratory Panel by PCR     Status: Abnormal   Collection Time: 01/09/19 12:54 AM  Result Value Ref Range Status   Adenovirus NOT DETECTED NOT DETECTED Final   Coronavirus 229E NOT DETECTED NOT DETECTED Final    Comment: (NOTE) The Coronavirus on the Respiratory Panel, DOES NOT test for the novel  Coronavirus (2019 nCoV)    Coronavirus HKU1 NOT DETECTED NOT DETECTED Final   Coronavirus NL63 NOT DETECTED NOT DETECTED Final   Coronavirus OC43 NOT DETECTED NOT DETECTED Final   Metapneumovirus NOT DETECTED NOT DETECTED Final   Rhinovirus / Enterovirus DETECTED (A) NOT DETECTED Final   Influenza A NOT DETECTED NOT DETECTED Final   Influenza B NOT DETECTED NOT DETECTED Final   Parainfluenza Virus 1 NOT DETECTED NOT DETECTED Final   Parainfluenza Virus 2 NOT DETECTED NOT DETECTED Final   Parainfluenza Virus 3 NOT DETECTED NOT DETECTED Final   Parainfluenza Virus 4 NOT DETECTED NOT DETECTED Final   Respiratory Syncytial Virus NOT DETECTED NOT  DETECTED Final   Bordetella pertussis NOT DETECTED NOT DETECTED Final   Chlamydophila pneumoniae NOT DETECTED NOT DETECTED Final   Mycoplasma pneumoniae NOT DETECTED NOT DETECTED Final    Comment: Performed at [facility], [place]. [address] [place], [place] [address]    Radiology Reports Dg Chest 2 View  Result Date: 01/08/2019 CLINICAL DATA:  Cough, fatigue, short of breath EXAM: CHEST - 2 VIEW COMPARISON:  None. FINDINGS: Normal cardiac silhouette. Intermediate density opacity projecting over the RIGHT costophrenic angle. Upper lungs are clear. LEFT lung base clear. Lateral projection demonstrates no focal abnormality. Degenerate spurring of spine. IMPRESSION: RIGHT lateral lung base opacity represent scarring, pneumonia, or pleural lesion. Recommend clinical correlation and consider CT thorax. These results will be called to the ordering clinician or representative by the Radiologist Assistant, and communication documented in the PACS or zVision Dashboard. Electronically Signed   By: Suzy Bouchard M.D.   On: 01/08/2019 16:29   Ct Angio Chest Pe W Or Wo Contrast  Result Date: 01/08/2019 CLINICAL DATA:  Headaches and shortness of breath with hypoxia EXAM: CT ANGIOGRAPHY CHEST WITH CONTRAST TECHNIQUE: Multidetector CT imaging of the chest was performed using the standard protocol during bolus administration of intravenous contrast. Multiplanar CT image reconstructions and MIPs were obtained to evaluate the vascular anatomy. CONTRAST:  188mL OMNIPAQUE IOHEXOL 350 MG/ML SOLN COMPARISON:  None. FINDINGS: Cardiovascular: --Pulmonary arteries: Contrast injection is sufficient to demonstrate satisfactory opacification of the pulmonary arteries to the segmental level, with attenuation of 325 HU at the main pulmonary artery. There is no pulmonary embolus. The main pulmonary artery is within normal limits for size. --Aorta: Satisfactory opacification of the thoracic aorta. No aortic dissection or other  acute aortic syndrome. Conventional 3 vessel aortic branching pattern. There is no aortic atherosclerosis. --Heart: Normal size. No pericardial effusion. Mediastinum/Nodes: No mediastinal, hilar or axillary lymphadenopathy. The visualized thyroid and thoracic esophageal course are unremarkable. Lungs/Pleura: Small right basilar pleural effusion. No focal consolidation. No pneumothorax. 4 mm left lung base nodule. Upper Abdomen: Contrast bolus timing is not optimized for evaluation of the abdominal organs. The visualized portions of the organs of the upper abdomen are normal. Musculoskeletal: No chest wall abnormality. No bony spinal canal stenosis. Review of the MIP images confirms the above findings. IMPRESSION: 1. No pulmonary embolus or acute aortic  syndrome. 2. Small right pleural effusion with basilar atelectasis. 3. 3 mm left lung base pulmonary nodule. No follow-up needed if patient is low-risk. Non-contrast chest CT can be considered in 12 months if patient is high-risk. This recommendation follows the consensus statement: Guidelines for Management of Incidental Pulmonary Nodules Detected on CT Images: From the Fleischner Society 2017; Radiology 2017; 284:228-243. Electronically Signed   By: Ulyses Jarred M.D.   On: 01/08/2019 22:42     CBC Recent Labs  Lab 01/08/19 1941 01/09/19 0538  WBC 7.5 4.5  HGB 16.5 16.8  HCT 47.1 51.0  PLT 278 269  MCV 103.1* 104.1*  MCH 36.1* 34.3*  MCHC 35.0 32.9  RDW 12.5 12.6    Chemistries  Recent Labs  Lab 01/08/19 1941 01/09/19 0538  NA 130* 139  K 3.6 4.3  CL 94* 100  CO2 26 25  GLUCOSE 111* 146*  BUN 5* 6  CREATININE 0.47* 0.50*  CALCIUM 8.5* 8.7*  AST  --  23  ALT  --  29  ALKPHOS  --  108  BILITOT  --  0.7   ------------------------------------------------------------------------------------------------------------------ No results for input(s): CHOL, HDL, LDLCALC, TRIG, CHOLHDL, LDLDIRECT in the last 72 hours.  No results found for:  HGBA1C ------------------------------------------------------------------------------------------------------------------ No results for input(s): TSH, T4TOTAL, T3FREE, THYROIDAB in the last 72 hours.  Invalid input(s): FREET3 ------------------------------------------------------------------------------------------------------------------ No results for input(s): VITAMINB12, FOLATE, FERRITIN, TIBC, IRON, RETICCTPCT in the last 72 hours.  Coagulation profile No results for input(s): INR, PROTIME in the last 168 hours.  No results for input(s): DDIMER in the last 72 hours.  Cardiac Enzymes Recent Labs  Lab 01/08/19 1941  TROPONINI <0.03   ------------------------------------------------------------------------------------------------------------------    Component Value Date/Time   BNP 20.0 01/08/2019 1942     Jordan Kelly M.D on 01/09/2019 at 3:16 PM  Go to [email or web address] - for contact info  [facility] - Office  [phone]

## 2019-01-10 LAB — HIV ANTIBODY (ROUTINE TESTING W REFLEX): HIV Screen 4th Generation wRfx: NONREACTIVE

## 2019-01-10 MED ORDER — FOLIC ACID 1 MG PO TABS: 1.0000 mg | ORAL_TABLET | Freq: Every day | ORAL | 3 refills | Status: DC

## 2019-01-10 MED ORDER — GUAIFENESIN ER 600 MG PO TB12: 600.0000 mg | ORAL_TABLET | Freq: Two times a day (BID) | ORAL | 0 refills | Status: DC

## 2019-01-10 MED ORDER — PREDNISONE 20 MG PO TABS: 40.0000 mg | ORAL_TABLET | Freq: Every day | ORAL | 0 refills | Status: DC

## 2019-01-10 MED ORDER — PREDNISONE 20 MG PO TABS
50.0000 mg | ORAL_TABLET | Freq: Once | ORAL | Status: AC
  Administered 2019-01-10: 50 mg via ORAL
  Filled 2019-01-10: qty 2

## 2019-01-10 MED ORDER — TIOTROPIUM BROMIDE MONOHYDRATE 18 MCG IN CAPS: 18.0000 ug | ORAL_CAPSULE | Freq: Every day | RESPIRATORY_TRACT | 2 refills | Status: DC

## 2019-01-10 MED ORDER — ALBUTEROL SULFATE HFA 108 (90 BASE) MCG/ACT IN AERS: 2.0000 | INHALATION_SPRAY | Freq: Four times a day (QID) | RESPIRATORY_TRACT | 2 refills | Status: DC | PRN

## 2019-01-10 MED ORDER — ADULT MULTIVITAMIN W/MINERALS CH: 1.0000 | ORAL_TABLET | Freq: Every day | ORAL | 3 refills | Status: AC

## 2019-01-10 NOTE — Discharge Instructions (Signed)
1) smoking cessation strongly advised 2) significant reduction of alcohol intake advised 3) use inhalers as prescribed--- for COPD (chronic obstructive pulmonary disease) and breathing problems 4) please follow-up with your primary care physician in a week for recheck

## 2019-01-10 NOTE — Discharge Summary (Signed)
Jordan Kelly, is a 58 y.o. male  DOB [birth year]  MRN [id number].  Admission date:  01/08/2019  Admitting Physician  Oswald Hillock, MD  Discharge Date:  01/10/2019   Primary MD  Patient, No Pcp Per  Recommendations for primary care physician for things to follow:   1) smoking cessation strongly advised 2) significant reduction of alcohol intake advised 3) use inhalers as prescribed--- for COPD (chronic obstructive pulmonary disease) and breathing problems 4) please follow-up with your primary care physician in a week for recheck   Admission Diagnosis  SOB (shortness of breath) [R06.02] COPD exacerbation ([place]) [J44.1] Acute respiratory failure with hypoxia ([place]) [J96.01]   Discharge Diagnosis  SOB (shortness of breath) [R06.02] COPD exacerbation ([facility]) [J44.1] Acute respiratory failure with hypoxia ([place]) [J96.01]    Principal Problem:   COPD with acute bronchitis/rhinovirus Active Problems:   COPD with acute exacerbation ([place])   Tobacco abuse   Alcohol abuse   Acute Respiratory failure with hypoxia ([place])      History reviewed. No pertinent past medical history.  Past Surgical History:  Procedure Laterality Date   HERNIA REPAIR         HPI  from the history and physical done on the day of admission:    Jordan Kelly  is a 58 y.o. male, with history of tobacco abuse, alcohol abuse came to ED with chief complaint of shortness of breath.  Patient states that for 5 days he has been having worsening shortness of breath also had nasal congestion.  Denies nausea vomiting or diarrhea.  Denies coughing up any phlegm.  Complains of mild dry cough.  Chest x-ray performed at urgent care was abnormal and was reportedly hypoxic so he was sent to the ED for CT imaging. Patient also complains of tick bite in left leg. In the ED CTA chest was negative for pulmonary embolism, showed small left pleural effusion  with atelectasis and no consolidation. SARS-CoV-2 was negative Denies chest pain No previous history of stroke or seizures.    Hospital Course:       Brief Summary- 58 y.o.male,with history of tobacco abuse, alcohol abuse admitted with respiratory symptoms and hypoxia on 01/08/2019  A/p 1)Acute COPD Exacerbation- no definite pneumonia,   patient was treated with IV Solu-Medrol    mucinex,  doxycycline and bronchodilators as ordered, supplemental oxygen as ordered.  Hypoxia has resolved... O2 sats on room air at rest is 93 to 94%, O2 sats post ambulation this morning is 92 to 93% on room air.  Respiratory panel with rhinovirus.  Okay to discharge home on prednisone and bronchodilators  2)Acute Hypoxic Resp Failure---  hypoxia secondary to #1 above--- resolved hypoxia, see #1 above  3)Alcohol abuse--- no evidence of frank delirium tremens at this time ,continue folic acid and multivitamin, significant reduction in alcohol intake advised  4)Tobacco and THC abuse--- patient with almost 40 pack years, is not ready to quit smoking--- smoking cessation advised and encouraged  Discharge Condition: stable  Follow UP--- PCP within the  week  Diet and Activity recommendation:  As advised  Discharge Instructions    Discharge Instructions    Call MD for:  difficulty breathing, headache or visual disturbances   Complete by:  As directed    Call MD for:  persistant dizziness or light-headedness   Complete by:  As directed    Call MD for:  persistant nausea and vomiting   Complete by:  As directed    Call MD for:  severe uncontrolled pain   Complete by:  As directed    Call MD for:  temperature >100.4   Complete by:  As directed    Diet general   Complete by:  As directed    Discharge instructions   Complete by:  As directed    1) smoking cessation strongly advised 2) significant reduction of alcohol intake advised 3) use inhalers as prescribed--- for COPD (chronic obstructive  pulmonary disease) and breathing problems 4) please follow-up with your primary care physician in a week for recheck   Increase activity slowly   Complete by:  As directed        Discharge Medications     Allergies as of 01/10/2019   No Known Allergies     Medication List    TAKE these medications   albuterol 108 (90 Base) MCG/ACT inhaler Commonly known as:  VENTOLIN HFA Inhale 2 puffs into the lungs every 6 (six) hours as needed for wheezing or shortness of breath. Dx J44.1--- COPD   diazepam 5 MG tablet Commonly known as:  VALIUM Take 5 mg by mouth daily.   folic acid 1 MG tablet Commonly known as:  FOLVITE Take 1 tablet (1 mg total) by mouth daily. Start taking on:  January 11, 2019   guaiFENesin 600 MG 12 hr tablet Commonly known as:  MUCINEX Take 1 tablet (600 mg total) by mouth 2 (two) times daily. What changed:  how much to take   multivitamin with minerals Tabs tablet Take 1 tablet by mouth daily.   predniSONE 20 MG tablet Commonly known as:  Deltasone Take 2 tablets (40 mg total) by mouth daily with breakfast.   tiotropium 18 MCG inhalation capsule Commonly known as:  Spiriva HandiHaler Place 1 capsule (18 mcg total) into inhaler and inhale daily. Dx J44.1--- COPD       Major procedures and Radiology Reports - PLEASE review detailed and final reports for all details, in brief -   Dg Chest 2 View  Result Date: 01/08/2019 CLINICAL DATA:  Cough, fatigue, short of breath EXAM: CHEST - 2 VIEW COMPARISON:  None. FINDINGS: Normal cardiac silhouette. Intermediate density opacity projecting over the RIGHT costophrenic angle. Upper lungs are clear. LEFT lung base clear. Lateral projection demonstrates no focal abnormality. Degenerate spurring of spine. IMPRESSION: RIGHT lateral lung base opacity represent scarring, pneumonia, or pleural lesion. Recommend clinical correlation and consider CT thorax. These results will be called to the ordering clinician or representative  by the Radiologist Assistant, and communication documented in the PACS or zVision Dashboard. Electronically Signed   By: Suzy Bouchard M.D.   On: 01/08/2019 16:29   Ct Angio Chest Pe W Or Wo Contrast  Result Date: 01/08/2019 CLINICAL DATA:  Headaches and shortness of breath with hypoxia EXAM: CT ANGIOGRAPHY CHEST WITH CONTRAST TECHNIQUE: Multidetector CT imaging of the chest was performed using the standard protocol during bolus administration of intravenous contrast. Multiplanar CT image reconstructions and MIPs were obtained to evaluate the vascular anatomy. CONTRAST:  126mL OMNIPAQUE IOHEXOL 350  MG/ML SOLN COMPARISON:  None. FINDINGS: Cardiovascular: --Pulmonary arteries: Contrast injection is sufficient to demonstrate satisfactory opacification of the pulmonary arteries to the segmental level, with attenuation of 325 HU at the main pulmonary artery. There is no pulmonary embolus. The main pulmonary artery is within normal limits for size. --Aorta: Satisfactory opacification of the thoracic aorta. No aortic dissection or other acute aortic syndrome. Conventional 3 vessel aortic branching pattern. There is no aortic atherosclerosis. --Heart: Normal size. No pericardial effusion. Mediastinum/Nodes: No mediastinal, hilar or axillary lymphadenopathy. The visualized thyroid and thoracic esophageal course are unremarkable. Lungs/Pleura: Small right basilar pleural effusion. No focal consolidation. No pneumothorax. 4 mm left lung base nodule. Upper Abdomen: Contrast bolus timing is not optimized for evaluation of the abdominal organs. The visualized portions of the organs of the upper abdomen are normal. Musculoskeletal: No chest wall abnormality. No bony spinal canal stenosis. Review of the MIP images confirms the above findings. IMPRESSION: 1. No pulmonary embolus or acute aortic syndrome. 2. Small right pleural effusion with basilar atelectasis. 3. 3 mm left lung base pulmonary nodule. No follow-up needed if  patient is low-risk. Non-contrast chest CT can be considered in 12 months if patient is high-risk. This recommendation follows the consensus statement: Guidelines for Management of Incidental Pulmonary Nodules Detected on CT Images: From the Fleischner Society 2017; Radiology 2017; 284:228-243. Electronically Signed   By: Ulyses Jarred M.D.   On: 01/08/2019 22:42    Micro Results    Recent Results (from the past 240 hour(s))  SARS Coronavirus 2 (CEPHEID- Performed in [place] [facility] hospital lab), Hosp Order     Status: None   Collection Time: 01/08/19  7:44 PM  Result Value Ref Range Status   SARS Coronavirus 2 NEGATIVE NEGATIVE Final    Comment: (NOTE) If result is NEGATIVE SARS-CoV-2 target nucleic acids are NOT DETECTED. The SARS-CoV-2 RNA is generally detectable in upper and lower  respiratory specimens during the acute phase of infection. The lowest  concentration of SARS-CoV-2 viral copies this assay can detect is 250  copies / mL. A negative result does not preclude SARS-CoV-2 infection  and should not be used as the sole basis for treatment or other  patient management decisions.  A negative result may occur with  improper specimen collection / handling, submission of specimen other  than nasopharyngeal swab, presence of viral mutation(s) within the  areas targeted by this assay, and inadequate number of viral copies  (<250 copies / mL). A negative result must be combined with clinical  observations, patient history, and epidemiological information. If result is POSITIVE SARS-CoV-2 target nucleic acids are DETECTED. The SARS-CoV-2 RNA is generally detectable in upper and lower  respiratory specimens dur ing the acute phase of infection.  Positive  results are indicative of active infection with SARS-CoV-2.  Clinical  correlation with patient history and other diagnostic information is  necessary to determine patient infection status.  Positive results do  not rule out bacterial  infection or co-infection with other viruses. If result is PRESUMPTIVE POSTIVE SARS-CoV-2 nucleic acids MAY BE PRESENT.   A presumptive positive result was obtained on the submitted specimen  and confirmed on repeat testing.  While 2019 novel coronavirus  (SARS-CoV-2) nucleic acids may be present in the submitted sample  additional confirmatory testing may be necessary for epidemiological  and / or clinical management purposes  to differentiate between  SARS-CoV-2 and other Sarbecovirus currently known to infect humans.  If clinically indicated additional testing with an alternate test  methodology [phone]) is advised. The SARS-CoV-2 RNA is generally  detectable in upper and lower respiratory sp ecimens during the acute  phase of infection. The expected result is Negative. Fact Sheet for Patients:  StrictlyIdeas.no Fact Sheet for Healthcare Providers: BankingDealers.co.za This test is not yet approved or cleared by the [place] FDA and has been authorized for detection and/or diagnosis of SARS-CoV-2 by FDA under an Emergency Use Authorization (EUA).  This EUA will remain in effect (meaning this test can be used) for the duration of the COVID-19 declaration under Section 564(b)(1) of the Act, 21 U.S.C. section 360bbb-3(b)(1), unless the authorization is terminated or revoked sooner. Performed at [facility], [address], [place], [place] [address]   Respiratory Panel by PCR     Status: Abnormal   Collection Time: 01/09/19 12:54 AM  Result Value Ref Range Status   Adenovirus NOT DETECTED NOT DETECTED Final   Coronavirus 229E NOT DETECTED NOT DETECTED Final    Comment: (NOTE) The Coronavirus on the Respiratory Panel, DOES NOT test for the novel  Coronavirus (2019 nCoV)    Coronavirus HKU1 NOT DETECTED NOT DETECTED Final   Coronavirus NL63 NOT DETECTED NOT DETECTED Final   Coronavirus OC43 NOT DETECTED NOT DETECTED Final    Metapneumovirus NOT DETECTED NOT DETECTED Final   Rhinovirus / Enterovirus DETECTED (A) NOT DETECTED Final   Influenza A NOT DETECTED NOT DETECTED Final   Influenza B NOT DETECTED NOT DETECTED Final   Parainfluenza Virus 1 NOT DETECTED NOT DETECTED Final   Parainfluenza Virus 2 NOT DETECTED NOT DETECTED Final   Parainfluenza Virus 3 NOT DETECTED NOT DETECTED Final   Parainfluenza Virus 4 NOT DETECTED NOT DETECTED Final   Respiratory Syncytial Virus NOT DETECTED NOT DETECTED Final   Bordetella pertussis NOT DETECTED NOT DETECTED Final   Chlamydophila pneumoniae NOT DETECTED NOT DETECTED Final   Mycoplasma pneumoniae NOT DETECTED NOT DETECTED Final    Comment: Performed at [facility], [place]. [address], [place], [place] [address]       Today   Subjective    Jordan Kelly today has no NEW concerns, no fevers no chills, cough and shortness of breath improved, no wheezing,         O2 sats on room air at rest is 93 to 94%, O2 sats post ambulation this morning is 92 to 93% on room air.       Patient has been seen and examined prior to discharge   Objective   Blood pressure 129/80, pulse 98, temperature 98.6 F (37 C), temperature source Oral, resp. rate 18, height 5\' 11"  (1.803 m), weight 78.3 kg, SpO2 92 %.   Intake/Output Summary (Last 24 hours) at 01/10/2019 1038 Last data filed at 01/10/2019 0854 Gross per 24 hour  Intake 840 ml  Output 2300 ml  Net -1460 ml    Exam Gen:- Awake Alert, no acute distress , able to speak in complete sentences HEENT:- [place].AT, No sclera icterus Neck-Supple Neck,No JVD,.  Lungs-mostly clear bilaterally, fair air movement bilaterally  CV- S1, S2 normal, regular Abd-  +ve B.Sounds, Abd Soft, No tenderness,    Extremity/Skin:- No  edema,   good pulses Psych-affect is appropriate, oriented x3 Neuro-no new focal deficits, no tremors    Data Review   CBC w Diff:  Lab Results  Component Value Date   WBC 4.5 01/09/2019   HGB 16.8 01/09/2019    HCT 51.0 01/09/2019   PLT 269 01/09/2019    CMP:  Lab  Results  Component Value Date   NA 139 01/09/2019   K 4.3 01/09/2019   CL 100 01/09/2019   CO2 25 01/09/2019   BUN 6 01/09/2019   CREATININE 0.50 (L) 01/09/2019   PROT 7.4 01/09/2019   ALBUMIN 3.6 01/09/2019   BILITOT 0.7 01/09/2019   ALKPHOS 108 01/09/2019   AST 23 01/09/2019   ALT 29 01/09/2019  .   Total Discharge time is about 33 minutes  Roxan Hockey M.D on 01/10/2019 at 10:38 AM  Go to [email or web address] -  for contact info  [facility] - Office  [phone]

## 2019-01-10 NOTE — Progress Notes (Signed)
IV and tele removed, no complaints. Patient ambulated from unit to ER parking lot.

## 2020-10-30 ENCOUNTER — Other Ambulatory Visit: Payer: Self-pay

## 2020-10-30 ENCOUNTER — Ambulatory Visit (INDEPENDENT_AMBULATORY_CARE_PROVIDER_SITE_OTHER): Payer: 59 | Admitting: Nurse Practitioner

## 2020-10-30 ENCOUNTER — Encounter: Payer: Self-pay | Admitting: Nurse Practitioner

## 2020-10-30 VITALS — BP 135/92 | HR 72 | Temp 97.9°F | Resp 18 | Ht 70.0 in | Wt 192.0 lb

## 2020-10-30 DIAGNOSIS — J4489 Other specified chronic obstructive pulmonary disease: Secondary | ICD-10-CM

## 2020-10-30 DIAGNOSIS — M7989 Other specified soft tissue disorders: Secondary | ICD-10-CM | POA: Diagnosis not present

## 2020-10-30 DIAGNOSIS — R110 Nausea: Secondary | ICD-10-CM

## 2020-10-30 DIAGNOSIS — Z139 Encounter for screening, unspecified: Secondary | ICD-10-CM | POA: Diagnosis not present

## 2020-10-30 DIAGNOSIS — Z720 Tobacco use: Secondary | ICD-10-CM

## 2020-10-30 DIAGNOSIS — K5900 Constipation, unspecified: Secondary | ICD-10-CM | POA: Insufficient documentation

## 2020-10-30 DIAGNOSIS — J449 Chronic obstructive pulmonary disease, unspecified: Secondary | ICD-10-CM | POA: Diagnosis not present

## 2020-10-30 DIAGNOSIS — F1010 Alcohol abuse, uncomplicated: Secondary | ICD-10-CM

## 2020-10-30 DIAGNOSIS — Z7689 Persons encountering health services in other specified circumstances: Secondary | ICD-10-CM | POA: Diagnosis not present

## 2020-10-30 DIAGNOSIS — J439 Emphysema, unspecified: Secondary | ICD-10-CM | POA: Insufficient documentation

## 2020-10-30 MED ORDER — FUROSEMIDE 40 MG PO TABS: 40.0000 mg | ORAL_TABLET | Freq: Every day | ORAL | 0 refills | Status: DC

## 2020-10-30 MED ORDER — FLEET ENEMA 7-19 GM/118ML RE ENEM: 1.0000 | ENEMA | Freq: Every day | RECTAL | 0 refills | Status: DC | PRN

## 2020-10-30 MED ORDER — ONDANSETRON 4 MG PO TBDP: 4.0000 mg | ORAL_TABLET | Freq: Three times a day (TID) | ORAL | 0 refills | Status: DC | PRN

## 2020-10-30 MED ORDER — BISACODYL 10 MG RE SUPP: 10.0000 mg | RECTAL | 0 refills | Status: DC | PRN

## 2020-10-30 NOTE — Assessment & Plan Note (Addendum)
-  Rx. Lasix x 1 week -check labs prior to next visit

## 2020-10-30 NOTE — Assessment & Plan Note (Signed)
-  had exacerbation with hospitalization May 2021 -uses albuterol inhaler

## 2020-10-30 NOTE — Patient Instructions (Addendum)
I called in dulcolax suppository for you as well as fleets enema.  Try 1 of the dulcolax suppositories. If you have no BM in 4 hours, thyr the fleets enema.  If you have no BM, or if abdominal pain gets worse, go to the ED and they will be able to perform immediate imaging to determine if you have a bowel obstruction.  We will meet back up in 1 week for a physical. Please have fasting labs drawn 2-3 days prior to your appointment so we can review them during your office visit.

## 2020-10-30 NOTE — Assessment & Plan Note (Signed)
-  LBM was 5 days ago -will get x-ray today -has hx of hernia repair, so higher risk for obstruction -will try dulcolax suppository, if that fails fleets enema -if enema's fail, would have him go to ED for stat imaging

## 2020-10-30 NOTE — Assessment & Plan Note (Signed)
-  started with constipation -Rx. zofran -will treat constipation

## 2020-10-30 NOTE — Progress Notes (Signed)
New Patient Office Visit  Subjective:  Patient ID: Jordan Kelly, male    DOB: 1960/12/09  Age: 61 y.o. MRN: [id number]  CC:  Chief Complaint  Patient presents with  . New Patient (Initial Visit)    Pt complains of constipation x 5 days, having some swelling in feet x 5 days, and eyes.     HPI Jordan Kelly presents for new patient visit. Transferring care from Dr. Damien Fusi Last physical and labs were years ago with PCP. Last labs May 2021 in hospital; any more recent?  He states that he has not had a BM in 5 days. He has tried prunes and exlax, but that hasn't worked. He is having some right abdominal pain.  He states that he has some swelling in his feet.  He states that he has been getting valium from his sister, but he has been taking it for depression.      Past Medical History:  Diagnosis Date  . COPD (chronic obstructive pulmonary disease) ([place])   . Depression     Past Surgical History:  Procedure Laterality Date  . HERNIA REPAIR     right inguinal hernia    History reviewed. No pertinent family history.  Social History   Socioeconomic History  . Marital status: Divorced    Spouse name: Not on file  . Number of children: Not on file  . Years of education: Not on file  . Highest education level: Not on file  Occupational History  . 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 Smoking status: Current Every Day Smoker    Packs/day: 1.00    Types: Cigarettes  . Smokeless tobacco: Never Used  Vaping Use  . Vaping Use: Never used  Substance and Sexual Activity  . Alcohol use: Yes    Alcohol/week: 12.0 standard drinks    Types: 12 Cans of beer per week    Comment: 6 beers per day  . Drug use: Yes    Types: Marijuana, Benzodiazepines  . Sexual activity: Not Currently  Other Topics Concern  . Not on file  Social History Narrative  . Not on file   Social Determinants of Health   Financial Resource Strain: Not on file  Food  Insecurity: Not on file  Transportation Needs: Not on file  Physical Activity: Not on file  Stress: Not on file  Social Connections: Not on file  Intimate Partner Violence: Not on file    ROS Review of Systems  Constitutional: Negative.   Respiratory: Negative.   Cardiovascular: Positive for leg swelling. Negative for chest pain and palpitations.  Gastrointestinal: Positive for constipation and nausea.  Psychiatric/Behavioral: Negative for self-injury and suicidal ideas.       Depression    Objective:   Today's Vitals: BP (!) 135/92   Pulse 72   Temp 97.9 F (36.6 C)   Resp 18   Ht _0  (1.778 m)   Wt 192 lb (87.1 kg)   SpO2 92%   BMI 27.55 kg/m   Physical Exam Constitutional:      Appearance: Normal appearance.  Cardiovascular:     Rate and Rhythm: Normal rate and regular rhythm.     Pulses: Normal pulses.     Heart sounds: Normal heart sounds.     Comments: Bilateral 2+ pitting edema to lower legs; hemosiderin staining to lower legs with R>L Pulmonary:     Effort: Pulmonary effort is normal.  Breath sounds: Normal breath sounds.  Abdominal:     General: Bowel sounds are normal. There is distension.     Tenderness: There is abdominal tenderness.     Hernia: A hernia is present.  Musculoskeletal:        General: Normal range of motion.  Neurological:     Mental Status: He is alert.  Psychiatric:        Behavior: Behavior normal.        Thought Content: Thought content normal.        Judgment: Judgment normal.     Comments: Depressed mood     Assessment & Plan:   Problem List Items Addressed This Visit      Respiratory   Obstructive chronic bronchitis without exacerbation ([place])    -had exacerbation with hospitalization May 2021 -uses albuterol inhaler        Other   Tobacco abuse    Smokes 1 ppd; not interested in quitting today      Relevant Orders   CBC with Differential/Platelet   Alcohol abuse    -states he drinks 6 pack of beer per  day -will check B12 and folate with next set of labs -took folic acid in the past      Relevant Orders   Lipid Panel With LDL/HDL Ratio   B12 and Folate Panel   Encounter to establish care    -obtain records       Relevant Orders   Ambulatory referral to Cardiology   DG Abd 2 Views   CBC with Differential/Platelet   CMP14+EGFR   Lipid Panel With LDL/HDL Ratio   HCV Ab w/Rflx to Verification   B12 and Folate Panel   Leg swelling    -Rx. Lasix x 1 week -check labs prior to next visit      Relevant Orders   Ambulatory referral to Cardiology   CMP14+EGFR   Lipid Panel With LDL/HDL Ratio   Constipation    -LBM was 5 days ago -will get x-ray today -has hx of hernia repair, so higher risk for obstruction -will try dulcolax suppository, if that fails fleets enema -if enema's fail, would have him go to ED for stat imaging       Relevant Orders   DG Abd 2 Views   Nausea    -started with constipation -Rx. zofran -will treat constipation       Other Visit Diagnoses    Screening due    -  Primary   Relevant Orders   HCV Ab w/Rflx to Verification      Outpatient Encounter Medications as of 10/30/2020  Medication Sig  . albuterol (VENTOLIN HFA) 108 (90 Base) MCG/ACT inhaler Inhale 2 puffs into the lungs every 6 (six) hours as needed for wheezing or shortness of breath. Dx J44.1--- COPD  . bisacodyl (DULCOLAX) 10 MG suppository Place 1 suppository (10 mg total) rectally as needed for moderate constipation.  . furosemide (LASIX) 40 MG tablet Take 1 tablet (40 mg total) by mouth daily.  Marland Kitchen guaiFENesin (MUCINEX) 600 MG 12 hr tablet Take 1 tablet (600 mg total) by mouth 2 (two) times daily.  . Multiple Vitamin (MULTIVITAMIN WITH MINERALS) TABS tablet Take 1 tablet by mouth daily.  . ondansetron (ZOFRAN-ODT) 4 MG disintegrating tablet Take 1 tablet (4 mg total) by mouth every 8 (eight) hours as needed for nausea or vomiting.  . sodium phosphate (FLEET) 7-19 GM/118ML ENEM Place  133 mLs (1 enema total) rectally daily as needed for severe  constipation.  . [DISCONTINUED] diazepam (VALIUM) 5 MG tablet Take 5 mg by mouth daily.  . [DISCONTINUED] folic acid (FOLVITE) 1 MG tablet Take 1 tablet (1 mg total) by mouth daily. (Patient not taking: Reported on 10/30/2020)  . [DISCONTINUED] predniSONE (DELTASONE) 20 MG tablet Take 2 tablets (40 mg total) by mouth daily with breakfast.  . [DISCONTINUED] tiotropium (SPIRIVA HANDIHALER) 18 MCG inhalation capsule Place 1 capsule (18 mcg total) into inhaler and inhale daily. Dx J44.1--- COPD   No facility-administered encounter medications on file as of 10/30/2020.    Follow-up: Return in about 1 week (around 11/06/2020) for Physical Exam.   Noreene Larsson, NP

## 2020-10-30 NOTE — Assessment & Plan Note (Signed)
Smokes 1 ppd; not interested in quitting today

## 2020-10-30 NOTE — Assessment & Plan Note (Signed)
-  states he drinks 6 pack of beer per day -will check B12 and folate with next set of labs -took folic acid in the past

## 2020-10-30 NOTE — Assessment & Plan Note (Signed)
-  obtain records 

## 2020-11-03 NOTE — Progress Notes (Deleted)
Established Patient Office Visit  Subjective:  Patient ID: Jordan Kelly, male    DOB: [birth year]  Age: 60 y.o. MRN: [id number]  CC: No chief complaint on file.   HPI EZIAH NEGRO presents for ***   Right ear- needs derm consult if mass present  Past Medical History:  Diagnosis Date  . COPD (chronic obstructive pulmonary disease) ([place])   . Depression     Past Surgical History:  Procedure Laterality Date  . HERNIA REPAIR     right inguinal hernia    No family history on file.  Social History   Socioeconomic History  . Marital status: Divorced    Spouse name: Not on file  . Number of children: Not on file  . Years of education: Not on file  . Highest education level: Not on file  Occupational History  . 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 Smoking status: Current Every Day Smoker    Packs/day: 1.00    Types: Cigarettes  . Smokeless tobacco: Never Used  Vaping Use  . Vaping Use: Never used  Substance and Sexual Activity  . Alcohol use: Yes    Alcohol/week: 12.0 standard drinks    Types: 12 Cans of beer per week    Comment: 6 beers per day  . Drug use: Yes    Types: Marijuana, Benzodiazepines  . Sexual activity: Not Currently  Other Topics Concern  . Not on file  Social History Narrative  . Not on file   Social Determinants of Health   Financial Resource Strain: Not on file  Food Insecurity: Not on file  Transportation Needs: Not on file  Physical Activity: Not on file  Stress: Not on file  Social Connections: Not on file  Intimate Partner Violence: Not on file    Outpatient Medications Prior to Visit  Medication Sig Dispense Refill  . albuterol (VENTOLIN HFA) 108 (90 Base) MCG/ACT inhaler Inhale 2 puffs into the lungs every 6 (six) hours as needed for wheezing or shortness of breath. Dx J44.1--- COPD 1 Inhaler 2  . bisacodyl (DULCOLAX) 10 MG suppository Place 1 suppository (10 mg total) rectally as needed for  moderate constipation. 12 suppository 0  . furosemide (LASIX) 40 MG tablet Take 1 tablet (40 mg total) by mouth daily. 7 tablet 0  . guaiFENesin (MUCINEX) 600 MG 12 hr tablet Take 1 tablet (600 mg total) by mouth 2 (two) times daily. 20 tablet 0  . Multiple Vitamin (MULTIVITAMIN WITH MINERALS) TABS tablet Take 1 tablet by mouth daily. 30 tablet 3  . ondansetron (ZOFRAN-ODT) 4 MG disintegrating tablet Take 1 tablet (4 mg total) by mouth every 8 (eight) hours as needed for nausea or vomiting. 20 tablet 0  . sodium phosphate (FLEET) 7-19 GM/118ML ENEM Place 133 mLs (1 enema total) rectally daily as needed for severe constipation. 133 mL 0   No facility-administered medications prior to visit.    No Known Allergies  ROS Review of Systems    Objective:    Physical Exam  There were no vitals taken for this visit. Wt Readings from Last 3 Encounters:  10/30/20 192 lb (87.1 kg)  01/09/19 172 lb 9.9 oz (78.3 kg)     Health Maintenance Due  Topic Date Due  . Hepatitis C Screening  Never done  . COVID-19 Vaccine (1) Never done  . TETANUS/TDAP  Never done  . COLONOSCOPY (Pts 45-27yrs Insurance coverage will need to be  confirmed)  Never done    There are no preventive care reminders to display for this patient.  No results found for: TSH Lab Results  Component Value Date   WBC 4.5 01/09/2019   HGB 16.8 01/09/2019   HCT 51.0 01/09/2019   MCV 104.1 (H) 01/09/2019   PLT 269 01/09/2019   Lab Results  Component Value Date   NA 139 01/09/2019   K 4.3 01/09/2019   CO2 25 01/09/2019   GLUCOSE 146 (H) 01/09/2019   BUN 6 01/09/2019   CREATININE 0.50 (L) 01/09/2019   BILITOT 0.7 01/09/2019   ALKPHOS 108 01/09/2019   AST 23 01/09/2019   ALT 29 01/09/2019   PROT 7.4 01/09/2019   ALBUMIN 3.6 01/09/2019   CALCIUM 8.7 (L) 01/09/2019   ANIONGAP 14 01/09/2019   No results found for: CHOL No results found for: HDL No results found for: LDLCALC No results found for: TRIG No results  found for: CHOLHDL No results found for: HGBA1C    Assessment & Plan:   Problem List Items Addressed This Visit   None     No orders of the defined types were placed in this encounter.   Follow-up: No follow-ups on file.    Noreene Larsson, NP

## 2020-11-04 ENCOUNTER — Emergency Department (HOSPITAL_COMMUNITY): Payer: 59

## 2020-11-04 ENCOUNTER — Inpatient Hospital Stay (HOSPITAL_COMMUNITY)
Admission: EM | Admit: 2020-11-04 | Discharge: 2020-11-14 | DRG: 308 | Disposition: A | Discharge status: Home / Self Care | Payer: 59 | Attending: Internal Medicine | Admitting: Internal Medicine

## 2020-11-04 ENCOUNTER — Encounter (HOSPITAL_COMMUNITY): Payer: Self-pay | Admitting: Emergency Medicine

## 2020-11-04 ENCOUNTER — Other Ambulatory Visit: Payer: Self-pay

## 2020-11-04 DIAGNOSIS — E876 Hypokalemia: Secondary | ICD-10-CM | POA: Diagnosis not present

## 2020-11-04 DIAGNOSIS — R0902 Hypoxemia: Secondary | ICD-10-CM

## 2020-11-04 DIAGNOSIS — F1020 Alcohol dependence, uncomplicated: Secondary | ICD-10-CM | POA: Diagnosis present

## 2020-11-04 DIAGNOSIS — I5082 Biventricular heart failure: Secondary | ICD-10-CM | POA: Diagnosis not present

## 2020-11-04 DIAGNOSIS — Z720 Tobacco use: Secondary | ICD-10-CM | POA: Diagnosis present

## 2020-11-04 DIAGNOSIS — L259 Unspecified contact dermatitis, unspecified cause: Secondary | ICD-10-CM | POA: Diagnosis present

## 2020-11-04 DIAGNOSIS — K7040 Alcoholic hepatic failure without coma: Secondary | ICD-10-CM | POA: Diagnosis present

## 2020-11-04 DIAGNOSIS — I4892 Unspecified atrial flutter: Secondary | ICD-10-CM | POA: Diagnosis not present

## 2020-11-04 DIAGNOSIS — I959 Hypotension, unspecified: Secondary | ICD-10-CM | POA: Diagnosis not present

## 2020-11-04 DIAGNOSIS — J9601 Acute respiratory failure with hypoxia: Secondary | ICD-10-CM

## 2020-11-04 DIAGNOSIS — K7460 Unspecified cirrhosis of liver: Secondary | ICD-10-CM

## 2020-11-04 DIAGNOSIS — J441 Chronic obstructive pulmonary disease with (acute) exacerbation: Secondary | ICD-10-CM | POA: Diagnosis not present

## 2020-11-04 DIAGNOSIS — I519 Heart disease, unspecified: Secondary | ICD-10-CM | POA: Diagnosis not present

## 2020-11-04 DIAGNOSIS — J449 Chronic obstructive pulmonary disease, unspecified: Secondary | ICD-10-CM | POA: Diagnosis present

## 2020-11-04 DIAGNOSIS — I50810 Right heart failure, unspecified: Secondary | ICD-10-CM | POA: Diagnosis not present

## 2020-11-04 DIAGNOSIS — F32A Depression, unspecified: Secondary | ICD-10-CM | POA: Diagnosis present

## 2020-11-04 DIAGNOSIS — I5021 Acute systolic (congestive) heart failure: Secondary | ICD-10-CM | POA: Diagnosis not present

## 2020-11-04 DIAGNOSIS — I2729 Other secondary pulmonary hypertension: Secondary | ICD-10-CM | POA: Diagnosis present

## 2020-11-04 DIAGNOSIS — Q211 Atrial septal defect: Secondary | ICD-10-CM | POA: Diagnosis not present

## 2020-11-04 DIAGNOSIS — S0990XA Unspecified injury of head, initial encounter: Secondary | ICD-10-CM

## 2020-11-04 DIAGNOSIS — N179 Acute kidney failure, unspecified: Secondary | ICD-10-CM | POA: Diagnosis present

## 2020-11-04 DIAGNOSIS — K7030 Alcoholic cirrhosis of liver without ascites: Secondary | ICD-10-CM | POA: Diagnosis present

## 2020-11-04 DIAGNOSIS — F419 Anxiety disorder, unspecified: Secondary | ICD-10-CM | POA: Diagnosis not present

## 2020-11-04 DIAGNOSIS — I2609 Other pulmonary embolism with acute cor pulmonale: Secondary | ICD-10-CM

## 2020-11-04 DIAGNOSIS — F17210 Nicotine dependence, cigarettes, uncomplicated: Secondary | ICD-10-CM | POA: Diagnosis present

## 2020-11-04 DIAGNOSIS — Z79899 Other long term (current) drug therapy: Secondary | ICD-10-CM

## 2020-11-04 DIAGNOSIS — K429 Umbilical hernia without obstruction or gangrene: Secondary | ICD-10-CM | POA: Diagnosis present

## 2020-11-04 DIAGNOSIS — I484 Atypical atrial flutter: Secondary | ICD-10-CM

## 2020-11-04 DIAGNOSIS — F1010 Alcohol abuse, uncomplicated: Secondary | ICD-10-CM | POA: Diagnosis not present

## 2020-11-04 DIAGNOSIS — I50813 Acute on chronic right heart failure: Secondary | ICD-10-CM | POA: Diagnosis not present

## 2020-11-04 DIAGNOSIS — I483 Typical atrial flutter: Principal | ICD-10-CM | POA: Diagnosis present

## 2020-11-04 DIAGNOSIS — E7220 Disorder of urea cycle metabolism, unspecified: Secondary | ICD-10-CM | POA: Diagnosis not present

## 2020-11-04 DIAGNOSIS — L409 Psoriasis, unspecified: Secondary | ICD-10-CM | POA: Diagnosis present

## 2020-11-04 DIAGNOSIS — B372 Candidiasis of skin and nail: Secondary | ICD-10-CM | POA: Diagnosis present

## 2020-11-04 DIAGNOSIS — W07XXXA Fall from chair, initial encounter: Secondary | ICD-10-CM | POA: Diagnosis not present

## 2020-11-04 DIAGNOSIS — Z20822 Contact with and (suspected) exposure to covid-19: Secondary | ICD-10-CM | POA: Diagnosis present

## 2020-11-04 DIAGNOSIS — I509 Heart failure, unspecified: Secondary | ICD-10-CM

## 2020-11-04 DIAGNOSIS — Q2112 Patent foramen ovale: Secondary | ICD-10-CM

## 2020-11-04 DIAGNOSIS — R21 Rash and other nonspecific skin eruption: Secondary | ICD-10-CM | POA: Diagnosis not present

## 2020-11-04 DIAGNOSIS — J439 Emphysema, unspecified: Secondary | ICD-10-CM | POA: Diagnosis present

## 2020-11-04 DIAGNOSIS — K5900 Constipation, unspecified: Secondary | ICD-10-CM | POA: Diagnosis present

## 2020-11-04 DIAGNOSIS — E46 Unspecified protein-calorie malnutrition: Secondary | ICD-10-CM | POA: Diagnosis not present

## 2020-11-04 LAB — COMPREHENSIVE METABOLIC PANEL
ALT: 99 U/L — ABNORMAL HIGH (ref 0–44)
AST: 70 U/L — ABNORMAL HIGH (ref 15–41)
Albumin: 2.9 g/dL — ABNORMAL LOW (ref 3.5–5.0)
Alkaline Phosphatase: 108 U/L (ref 38–126)
Anion gap: 10 (ref 5–15)
BUN: 46 mg/dL — ABNORMAL HIGH (ref 6–20)
CO2: 30 mmol/L (ref 22–32)
Calcium: 8.5 mg/dL — ABNORMAL LOW (ref 8.9–10.3)
Chloride: 97 mmol/L — ABNORMAL LOW (ref 98–111)
Creatinine, Ser: 1.28 mg/dL — ABNORMAL HIGH (ref 0.61–1.24)
GFR, Estimated: >60 mL/min (ref >60)
Glucose, Bld: 111 mg/dL — ABNORMAL HIGH (ref 70–99)
Potassium: 4.3 mmol/L (ref 3.5–5.1)
Sodium: 137 mmol/L (ref 135–145)
Total Bilirubin: 0.9 mg/dL (ref 0.3–1.2)
Total Protein: 7 g/dL (ref 6.5–8.1)

## 2020-11-04 LAB — I-STAT CHEM 8, ED
BUN: 59 mg/dL — ABNORMAL HIGH (ref 6–20)
Calcium, Ion: 0.77 mmol/L — CL (ref 1.15–1.40)
Chloride: 102 mmol/L (ref 98–111)
Creatinine, Ser: 1.2 mg/dL (ref 0.61–1.24)
Glucose, Bld: 106 mg/dL — ABNORMAL HIGH (ref 70–99)
HCT: 49 % (ref 39.0–52.0)
Hemoglobin: 16.7 g/dL (ref 13.0–17.0)
Potassium: 5.2 mmol/L — ABNORMAL HIGH (ref 3.5–5.1)
Sodium: 133 mmol/L — ABNORMAL LOW (ref 135–145)
TCO2: 27 mmol/L (ref 22–32)

## 2020-11-04 LAB — URINALYSIS, ROUTINE W REFLEX MICROSCOPIC
Bilirubin Urine: NEGATIVE
Glucose, UA: NEGATIVE mg/dL
Hgb urine dipstick: NEGATIVE
Ketones, ur: NEGATIVE mg/dL
Leukocytes,Ua: NEGATIVE
Nitrite: NEGATIVE
Protein, ur: NEGATIVE mg/dL
Specific Gravity, Urine: 1.009 (ref 1.005–1.030)
pH: 5 (ref 5.0–8.0)

## 2020-11-04 LAB — CBC WITH DIFFERENTIAL/PLATELET
Abs Immature Granulocytes: 0.05 10*3/uL (ref 0.00–0.07)
Basophils Absolute: 0.1 10*3/uL (ref 0.0–0.1)
Basophils Relative: 1 %
Eosinophils Absolute: 0 10*3/uL (ref 0.0–0.5)
Eosinophils Relative: 1 %
HCT: 49.2 % (ref 39.0–52.0)
Hemoglobin: 15.3 g/dL (ref 13.0–17.0)
Immature Granulocytes: 1 %
Lymphocytes Relative: 17 %
Lymphs Abs: 1.5 10*3/uL (ref 0.7–4.0)
MCH: 32.8 pg (ref 26.0–34.0)
MCHC: 31.1 g/dL (ref 30.0–36.0)
MCV: 105.6 fL — ABNORMAL HIGH (ref 80.0–100.0)
Monocytes Absolute: 1.3 10*3/uL — ABNORMAL HIGH (ref 0.1–1.0)
Monocytes Relative: 15 %
Neutro Abs: 5.6 10*3/uL (ref 1.7–7.7)
Neutrophils Relative %: 65 %
Platelets: 306 10*3/uL (ref 150–400)
RBC: 4.66 MIL/uL (ref 4.22–5.81)
RDW: 13.4 % (ref 11.5–15.5)
WBC: 8.5 10*3/uL (ref 4.0–10.5)
nRBC: 1.1 % — ABNORMAL HIGH (ref 0.0–0.2)

## 2020-11-04 LAB — LACTIC ACID, PLASMA: Lactic Acid, Venous: 1.2 mmol/L (ref 0.5–1.9)

## 2020-11-04 LAB — AMMONIA: Ammonia: 35 umol/L (ref 9–35)

## 2020-11-04 LAB — BRAIN NATRIURETIC PEPTIDE: B Natriuretic Peptide: 1118 pg/mL — ABNORMAL HIGH (ref 0.0–100.0)

## 2020-11-04 MED ORDER — INFLUENZA VAC SPLIT QUAD 0.5 ML IM SUSY: 0.5000 mL | PREFILLED_SYRINGE | INTRAMUSCULAR | Status: DC

## 2020-11-04 MED ORDER — CHLORHEXIDINE GLUCONATE CLOTH 2 % EX PADS: 6.0000 | MEDICATED_PAD | Freq: Every day | CUTANEOUS | Status: DC

## 2020-11-04 MED ORDER — CHLORHEXIDINE GLUCONATE CLOTH 2 % EX PADS
6.0000 | MEDICATED_PAD | Freq: Every day | CUTANEOUS | Status: DC
  Administered 2020-11-04 – 2020-11-11 (×6): 6 via TOPICAL

## 2020-11-04 MED ORDER — ORAL CARE MOUTH RINSE
15.0000 mL | Freq: Two times a day (BID) | OROMUCOSAL | Status: DC
  Administered 2020-11-05 – 2020-11-14 (×19): 15 mL via OROMUCOSAL

## 2020-11-04 MED ORDER — FUROSEMIDE 10 MG/ML IJ SOLN
40.0000 mg | Freq: Once | INTRAMUSCULAR | Status: AC
  Administered 2020-11-04: 40 mg via INTRAVENOUS
  Filled 2020-11-04: qty 4

## 2020-11-04 MED ORDER — DILTIAZEM HCL 25 MG/5ML IV SOLN: 20.0000 mg | Freq: Once | INTRAVENOUS | Status: DC

## 2020-11-04 MED ORDER — PNEUMOCOCCAL VAC POLYVALENT 25 MCG/0.5ML IJ INJ: 0.5000 mL | INJECTION | INTRAMUSCULAR | Status: DC

## 2020-11-04 MED ORDER — DILTIAZEM HCL 25 MG/5ML IV SOLN
20.0000 mg | Freq: Once | INTRAVENOUS | Status: AC
  Administered 2020-11-04: 20 mg via INTRAVENOUS
  Filled 2020-11-04: qty 5

## 2020-11-04 MED ORDER — LORAZEPAM 2 MG/ML IJ SOLN
2.0000 mg | Freq: Once | INTRAMUSCULAR | Status: AC
  Administered 2020-11-04: 2 mg via INTRAVENOUS
  Filled 2020-11-04: qty 1

## 2020-11-04 NOTE — ED Triage Notes (Addendum)
Pt to the ED complaining of constipation x 1 week. Pt states he has had some mild confusion, trouble with balance, and severe fatigue.  Pt's oxygen saturation in Triage was 77 percent, nailbeds slightly blue.  Pt place on 2 liters of O2NC and rose to 88. Pt's sats rose to 95 on 4 L.

## 2020-11-04 NOTE — H&P (Signed)
CC: Atrial Flutter with CHF  HPI: 60 yr old gentleman was transferred as a case of atrial flutter with CHF.  Patient is an alcoholic and smoker and has taken alcohol as late as last afternoon.  Patient states that for the last 1 week he has felt tiredness and decrease in his urine output.  In the ER, he did not have any residual urine in bladder and after admission here, he passed 300 mL of urine.  His EGFR is more than 60 at admission.  Patient denies any palpitations or chest pain.  He has swelling of his legs.  Denies orthopnea.  He is a chronic smoker and has COPD.  His albumin is low and his liver enzymes are high and his edema and fluid overload may be secondary to liver problems.  His ammonia is at the higher limit of normal but his BNP is elevated as well.    Review of Systems:     Cardiac Review of Systems: {Y] = yes '[ ]'  = no  Chest Pain [    ]  Resting SOB [  y ] Exertional SOB  y[  ]  Orthopnea [  ]   Pedal Edema Blue.Reese   ]    Palpitations [  ] Syncope  [  ]   Presyncope [   ]  General Review of Systems: [Y] = yes [  ]=no Constitional: recent weight change [  ]; anorexia [  ]; fatigue [ y ]; nausea Blue.Reese  ]; night sweats [  ]; fever [  ]; or chills [  ];                                                                     Dental: poor dentition[  ];   Eye : blurred vision [  ]; diplopia [   ]; vision changes [  ];  Amaurosis fugax[  ]; Resp: cough [  ];  wheezing[y  ];  hemoptysis[  ]; shortness of breath[ y ]; paroxysmal nocturnal dyspnea[  ]; dyspnea on exertion[  ]; or orthopnea[  ];  GI:  gallstones[  ], vomiting[  ];  dysphagia[  ]; melena[  ];  hematochezia [  ]; heartburn[  ];   GU: kidney stones [  ]; hematuria[  ];   dysuria [  ];  nocturia[  ];               Skin: rash [  ], swelling[  ];, hair loss[  ];  peripheral edema[ y ];  or itching[  ]; Musculosketetal: myalgias[  ];  joint swelling[  ];  joint erythema[  ];  joint pain[  ];  back pain[  ];  Heme/Lymph: bruising[  ];   bleeding[  ];  anemia[  ];  Neuro: TIA[  ];  headaches[  ];  stroke[  ];  vertigo[  ];  seizures[  ];   paresthesias[  ];  difficulty walking[  ];  Psych:depression[ y ]; [place]  ];  Endocrine: diabetes[  ];  thyroid dysfunction[  ];  Other: Decreased urine output and no bowel movement.  Past Medical History:  Diagnosis Date  . COPD (chronic obstructive pulmonary disease) ([place])   . Depression  No current facility-administered medications on file prior to encounter.   Current Outpatient Medications on File Prior to Encounter  Medication Sig Dispense Refill  . albuterol (VENTOLIN HFA) 108 (90 Base) MCG/ACT inhaler Inhale 2 puffs into the lungs every 6 (six) hours as needed for wheezing or shortness of breath. Dx J44.1--- COPD 1 Inhaler 2  . bisacodyl (DULCOLAX) 10 MG suppository Place 1 suppository (10 mg total) rectally as needed for moderate constipation. 12 suppository 0  . furosemide (LASIX) 40 MG tablet Take 1 tablet (40 mg total) by mouth daily. 7 tablet 0  . guaiFENesin (MUCINEX) 600 MG 12 hr tablet Take 1 tablet (600 mg total) by mouth 2 (two) times daily. 20 tablet 0  . Multiple Vitamin (MULTIVITAMIN WITH MINERALS) TABS tablet Take 1 tablet by mouth daily. 30 tablet 3  . ondansetron (ZOFRAN-ODT) 4 MG disintegrating tablet Take 1 tablet (4 mg total) by mouth every 8 (eight) hours as needed for nausea or vomiting. 20 tablet 0  . sodium phosphate (FLEET) 7-19 GM/118ML ENEM Place 133 mLs (1 enema total) rectally daily as needed for severe constipation. 133 mL 0     No Known Allergies  Social History   Socioeconomic History  . Marital status: Divorced    Spouse name: Not on file  . Number of children: Not on file  . Years of education: Not on file  . Highest education level: Not on file  Occupational History  . 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 Smoking status: Current Every Day Smoker    Packs/day: 1.00    Types: Cigarettes  .  Smokeless tobacco: Never Used  Vaping Use  . Vaping Use: Never used  Substance and Sexual Activity  . Alcohol use: Yes    Alcohol/week: 42.0 standard drinks    Types: 42 Cans of beer per week    Comment: 6 beers per day  . Drug use: Yes    Frequency: 5.0 times per week    Types: Marijuana, Benzodiazepines    Comment: last smoked 11/03/20  . Sexual activity: Not Currently  Other Topics Concern  . Not on file  Social History Narrative  . Not on file   Social Determinants of Health   Financial Resource Strain: Not on file  Food Insecurity: Not on file  Transportation Needs: Not on file  Physical Activity: Not on file  Stress: Not on file  Social Connections: Not on file  Intimate Partner Violence: Not on file    History reviewed. No pertinent family history.  Lives alone.  Scheduled Meds: . Chlorhexidine Gluconate Cloth  6 each Topical Q0600  . influenza vac split quadrivalent PF  0.5 mL Intramuscular Tomorrow-1000  . mouth rinse  15 mL Mouth Rinse BID  . pneumococcal 23 valent vaccine  0.5 mL Intramuscular Tomorrow-1000   Continuous Infusions: PRN Meds:.  PHYSICAL EXAM: Vitals:   11/04/20 2200 11/04/20 2300  BP: 119/78 105/77  Pulse:  79  Resp: 20 19  Temp: 98.1 F (36.7 C)   SpO2: 92% 94%   General:  Well appearing. No respiratory difficulty HEENT: normal Neck: supple. no JVD. Carotids 2+ bilat; no bruits. No lymphadenopathy or thryomegaly appreciated. Cor: PMI nondisplaced. Irregular rate & rhythm. No rubs, gallops or murmurs. Lungs: Bilateral wheezes heard. Abdomen: soft, nontender, nondistended. No hepatosplenomegaly. No bruits or masses. Good bowel sounds. Extremities: 3+ edema Neuro: alert & oriented x 3, cranial nerves grossly intact. moves all 4 extremities  w/o difficulty.   ECG: Atrial flutter with variable block and rapid ventricular rate  Results for orders placed or performed during the hospital encounter of 11/04/20 (from the past 24 hour(s))   CBC with Differential/Platelet     Status: Abnormal   Collection Time: 11/04/20  3:14 PM  Result Value Ref Range   WBC 8.5 4.0 - 10.5 K/uL   RBC 4.66 4.22 - 5.81 MIL/uL   Hemoglobin 15.3 13.0 - 17.0 g/dL   HCT 49.2 39.0 - 52.0 %   MCV 105.6 (H) 80.0 - 100.0 fL   MCH 32.8 26.0 - 34.0 pg   MCHC 31.1 30.0 - 36.0 g/dL   RDW 13.4 11.5 - 15.5 %   Platelets 306 150 - 400 K/uL   nRBC 1.1 (H) 0.0 - 0.2 %   Neutrophils Relative % 65 %   Neutro Abs 5.6 1.7 - 7.7 K/uL   Lymphocytes Relative 17 %   Lymphs Abs 1.5 0.7 - 4.0 K/uL   Monocytes Relative 15 %   Monocytes Absolute 1.3 (H) 0.1 - 1.0 K/uL   Eosinophils Relative 1 %   Eosinophils Absolute 0.0 0.0 - 0.5 K/uL   Basophils Relative 1 %   Basophils Absolute 0.1 0.0 - 0.1 K/uL   Immature Granulocytes 1 %   Abs Immature Granulocytes 0.05 0.00 - 0.07 K/uL  Comprehensive metabolic panel     Status: Abnormal   Collection Time: 11/04/20  3:14 PM  Result Value Ref Range   Sodium 137 135 - 145 mmol/L   Potassium 4.3 3.5 - 5.1 mmol/L   Chloride 97 (L) 98 - 111 mmol/L   CO2 30 22 - 32 mmol/L   Glucose, Bld 111 (H) 70 - 99 mg/dL   BUN 46 (H) 6 - 20 mg/dL   Creatinine, Ser 1.28 (H) 0.61 - 1.24 mg/dL   Calcium 8.5 (L) 8.9 - 10.3 mg/dL   Total Protein 7.0 6.5 - 8.1 g/dL   Albumin 2.9 (L) 3.5 - 5.0 g/dL   AST 70 (H) 15 - 41 U/L   ALT 99 (H) 0 - 44 U/L   Alkaline Phosphatase 108 38 - 126 U/L   Total Bilirubin 0.9 0.3 - 1.2 mg/dL   GFR, Estimated >60 >60 mL/min   Anion gap 10 5 - 15  Brain natriuretic peptide     Status: Abnormal   Collection Time: 11/04/20  3:14 PM  Result Value Ref Range   B Natriuretic Peptide 1,118.0 (H) 0.0 - 100.0 pg/mL  Lactic acid, plasma     Status: None   Collection Time: 11/04/20  3:14 PM  Result Value Ref Range   Lactic Acid, Venous 1.2 0.5 - 1.9 mmol/L  Ammonia     Status: None   Collection Time: 11/04/20  3:38 PM  Result Value Ref Range   Ammonia 35 9 - 35 umol/L  I-stat chem 8, ED (not at [facility] or [facility])      Status: Abnormal   Collection Time: 11/04/20  3:40 PM  Result Value Ref Range   Sodium 133 (L) 135 - 145 mmol/L   Potassium 5.2 (H) 3.5 - 5.1 mmol/L   Chloride 102 98 - 111 mmol/L   BUN 59 (H) 6 - 20 mg/dL   Creatinine, Ser 1.20 0.61 - 1.24 mg/dL   Glucose, Bld 106 (H) 70 - 99 mg/dL   Calcium, Ion 0.77 (LL) 1.15 - 1.40 mmol/L   TCO2 27 22 - 32 mmol/L   Hemoglobin 16.7 13.0 - 17.0 g/dL  HCT 49.0 39.0 - 52.0 %  Blood culture (routine x 2)     Status: None (Preliminary result)   Collection Time: 11/04/20  3:44 PM   Specimen: Left Antecubital; Blood  Result Value Ref Range   Specimen Description      LEFT ANTECUBITAL BOTTLES DRAWN AEROBIC AND ANAEROBIC   Special Requests      Blood Culture adequate volume Performed at [facility], [address], [place], [place] [address]    Culture PENDING    Report Status PENDING   Blood culture (routine x 2)     Status: None (Preliminary result)   Collection Time: 11/04/20  3:46 PM   Specimen: BLOOD RIGHT ARM  Result Value Ref Range   Specimen Description      BLOOD RIGHT ARM BOTTLES DRAWN AEROBIC AND ANAEROBIC   Special Requests      Blood Culture adequate volume Performed at [facility], [address] [place], [place] [address]    Culture PENDING    Report Status PENDING   Urinalysis, Routine w reflex microscopic Urine, Random     Status: None   Collection Time: 11/04/20  6:00 PM  Result Value Ref Range   Color, Urine YELLOW YELLOW   APPearance CLEAR CLEAR   Specific Gravity, Urine 1.009 1.005 - 1.030   pH 5.0 5.0 - 8.0   Glucose, UA NEGATIVE NEGATIVE mg/dL   Hgb urine dipstick NEGATIVE NEGATIVE   Bilirubin Urine NEGATIVE NEGATIVE   Ketones, ur NEGATIVE NEGATIVE mg/dL   Protein, ur NEGATIVE NEGATIVE mg/dL   Nitrite NEGATIVE NEGATIVE   Leukocytes,Ua NEGATIVE NEGATIVE     ASSESSMENT and PLAN/DISCUSSION: Atrial Flutter with variable block: Patient has atrial flutter with variable block.  We will start the patient on  anticoagulation and initiate diltiazem drip.  He may need a TEE cardioversion if rate is not well controlled.  His blood pressure is low normal and we may be restricted with the amount of AV nodal blockers that we can give to control the heart rate.  Edema and elevated liver enzymes: Given the fact that he is an alcoholic, he needs to be further evaluated for liver disease.  Need to watch signs of alcohol withdrawal.  COPD: Patient has bilateral wheezes and will continue the home medications.  Discussed the findings and plan with the patient and he agrees.

## 2020-11-04 NOTE — ED Notes (Signed)
Pt verbalized last drink was today at noon.

## 2020-11-04 NOTE — ED Notes (Signed)
Pt is sleeping at this time. No signs of distress. Will continue to monitor.

## 2020-11-04 NOTE — ED Notes (Signed)
Pt being transported by [facility] at this time.

## 2020-11-04 NOTE — ED Notes (Signed)
Patient is resting comfortably. 

## 2020-11-04 NOTE — ED Provider Notes (Signed)
Medical screening examination/treatment/procedure(s) were conducted as a shared visit with non-physician practitioner(s) and myself.  I personally evaluated the patient during the encounter.  Clinical Impression:   Final diagnoses:  Acute congestive heart failure, unspecified heart failure type (HCC)  Hypoxic  Atrial flutter with rapid ventricular response ([place])   This patient is a critically ill-appearing 60 year old male, he states that he has a history of fairly heavy alcohol use currently drinking about 6 beers per day, reports that he has no prior history of cardiac disease, over the last couple of weeks he has felt some increasing confusion and possibly some weakness and presents today feeling like he cannot have a bowel movement or have any urinary flow in fact he states the last time he urinated was last night before going to bed and it was just a dribble.  He has not reduced his fluid intake despite that.  The patient denies any significant abdominal pain and has not noticed that he was particularly short of breath however when he showed up he was at least 77% on room air and a bit cyanotic appearing.  He required oxygen by nasal cannula the upright position and on exam it is clear that he has JVD in the upright position bilateral lower extremity edema he is tachypneic, his lung sounds show subtle rales at the bases however the right side has abnormal lung sounds compared to the left.  There is decreased movement through the right midlung.  Cardiac exam is remarkable for tachycardia which appears to be a regular narrow complex tachycardia with a rate of about 155 suggestive of atrial flutter with rapid ventricular rate.  This could be a complication of multiple things including alcohol use, kidney failure, heart failure, sepsis, we have reviewed his prior imaging studies and he had a slight effusion seen on his last x-ray from a couple years ago, no cancer was seen at that time.  Patient will  need to be admitted, he will need to have some rate control, a source of the patient's apparent CHF will need to be sought including renal liver and heart sources.  Will need CT scans of the chest abdomen and pelvis to make sure he does not have an obstructive uropathy, formal ultrasounds not available at this facility today.  Anticipate the need for admission to the hospital.  We will also obtain cultures given his hypotension and tachycardia despite the fact that he has not had fever or particular infectious symptoms  .Critical Care Performed by: Noemi Chapel, MD Authorized by: Noemi Chapel, MD   Critical care provider statement:    Critical care time (minutes):  35   Critical care time was exclusive of:  Separately billable procedures and treating other patients and teaching time   Critical care was necessary to treat or prevent imminent or life-threatening deterioration of the following conditions:  Respiratory failure and cardiac failure   Critical care was time spent personally by me on the following activities:  Blood draw for specimens, development of treatment plan with patient or surrogate, discussions with consultants, evaluation of patient's response to treatment, examination of patient, obtaining history from patient or surrogate, ordering and performing treatments and interventions, ordering and review of laboratory studies, ordering and review of radiographic studies, pulse oximetry, re-evaluation of patient's condition and review of old charts Comments:         The patient was given Cardizem, he had improvement in his heart rate, he is now in a variable block at 4-1 rate  controlled, blood pressure seems to be slightly better.  Thankfully renal and liver function appears relatively unaffected, he most certainly will need to be admitted and cardiology has graciously accepted this patient onto their service at [facility].  I have performed a bedside ultrasound under which  the patient's right-sided pleural effusion was confirmed, there is no ascites in the abdomen of any appreciable amount, very little urine in the bladder and his visual cardiac output seems to be extremely depressed with what appears to be global hypokinesis on my brief bedside cardiac ultrasound.    Noemi Chapel, MD 11/04/20 [phone]

## 2020-11-04 NOTE — ED Notes (Addendum)
Report given to Rebecca,RN at [facility].

## 2020-11-04 NOTE — ED Provider Notes (Addendum)
[place] Provider Note   CSN: [id number] Arrival date & time: 11/04/20  1412     History Chief Complaint  Patient presents with  . Constipation    STAFFORD RIVIERA is a 60 y.o. male with past medical history significant for COPD who presents to the ED with complaints of constipation.  While in triage, patient was noted to be hypoxic to 77% on room air with bluish discoloration and nailbeds.  He was subsequently placed on supplemental oxygen.  On my examination, patient states that he has been feeling particularly weak for the past couple weeks and has also been feeling very fatigued and having disequilibrium.  Patient also notes that he is becoming increasingly confused.  He states that he was seen by his primary care provider a little over a week ago and was prescribed suppositories and laxative medications.  He states that while he is in here for constipation today, he also has been experiencing urinary disturbances and has not been able to void well.  He has noticed a 15 pound weight gain in recent weeks and is endorsing significant edema involving his face and lower extremities.  Patient endorses heavy alcohol use and smokes tobacco.    He feels mildly short of breath, but denies any overt orthopnea.  No prior oxygen requirements.  He also denies any recent fevers or chills, hemoptysis, history of clot or clotting disorder, chest pain, pleuritic symptoms, abdominal pain, nausea or vomiting, or other symptoms.  HPI     Past Medical History:  Diagnosis Date  . COPD (chronic obstructive pulmonary disease) ([place])   . Depression     Patient Active Problem List   Diagnosis Date Noted  . Congestive heart failure (CHF) ([place]) 11/04/2020  . Encounter to establish care 10/30/2020  . Obstructive chronic bronchitis without exacerbation ([place]) 10/30/2020  . Leg swelling 10/30/2020  . Constipation 10/30/2020  . Nausea 10/30/2020  . Tobacco abuse 01/09/2019  . Alcohol  abuse 01/09/2019    Past Surgical History:  Procedure Laterality Date  . HERNIA REPAIR     right inguinal hernia       History reviewed. No pertinent family history.  Social History   Tobacco Use  . Smoking status: Current Every Day Smoker    Packs/day: 1.00    Types: Cigarettes  . Smokeless tobacco: Never Used  Vaping Use  . Vaping Use: Never used  Substance Use Topics  . Alcohol use: Yes    Alcohol/week: 42.0 standard drinks    Types: 42 Cans of beer per week    Comment: 6 beers per day  . Drug use: Yes    Frequency: 5.0 times per week    Types: Marijuana, Benzodiazepines    Comment: last smoked 11/03/20    Home Medications Prior to Admission medications   Medication Sig Start Date End Date Taking? Authorizing Provider  albuterol (VENTOLIN HFA) 108 (90 Base) MCG/ACT inhaler Inhale 2 puffs into the lungs every 6 (six) hours as needed for wheezing or shortness of breath. Dx J44.1--- COPD 01/10/19  Yes Emokpae, Courage, MD  bisacodyl (DULCOLAX) 10 MG suppository Place 1 suppository (10 mg total) rectally as needed for moderate constipation. 10/30/20  Yes Noreene Larsson, NP  furosemide (LASIX) 40 MG tablet Take 1 tablet (40 mg total) by mouth daily. 10/30/20  Yes Noreene Larsson, NP  guaiFENesin (MUCINEX) 600 MG 12 hr tablet Take 1 tablet (600 mg total) by mouth 2 (two) times daily. 01/10/19  Yes Emokpae,  Courage, MD  Multiple Vitamin (MULTIVITAMIN WITH MINERALS) TABS tablet Take 1 tablet by mouth daily. 01/10/19  Yes Emokpae, Courage, MD  ondansetron (ZOFRAN-ODT) 4 MG disintegrating tablet Take 1 tablet (4 mg total) by mouth every 8 (eight) hours as needed for nausea or vomiting. 10/30/20  Yes Noreene Larsson, NP  sodium phosphate (FLEET) 7-19 GM/118ML ENEM Place 133 mLs (1 enema total) rectally daily as needed for severe constipation. 10/30/20  Yes Noreene Larsson, NP    Allergies    Patient has no known allergies.  Review of Systems   Review of Systems  Physical Exam Updated  Vital Signs BP 91/75   Pulse 78   Temp 97.7 F (36.5 C) (Oral)   Resp 17   Ht 5\' 10"  (1.778 m)   Wt 87.1 kg   SpO2 91%   BMI 27.55 kg/m   Physical Exam Vitals and nursing note reviewed. Exam conducted with a chaperone present.  Constitutional:      General: He is in acute distress.     Appearance: He is not toxic-appearing.  HENT:     Head: Normocephalic and atraumatic.  Eyes:     General: No scleral icterus.    Conjunctiva/sclera: Conjunctivae normal.  Cardiovascular:     Rate and Rhythm: Tachycardia present. Rhythm irregular.     Pulses: Normal pulses.  Pulmonary:     Effort: Pulmonary effort is normal.     Breath sounds: Wheezing present.     Comments: Mild expiratory wheezing bilaterally, but no significant increased work of breathing.  Requiring supplemental oxygen to maintain SPO2 90s. Abdominal:     General: Abdomen is flat.     Palpations: Abdomen is soft. There is no mass.     Tenderness: There is no abdominal tenderness. There is no guarding.     Comments: Soft, distended, but no tenderness or guarding.  Musculoskeletal:     Right lower leg: Edema present.     Left lower leg: Edema present.     Comments: Significant 3+ pitting edema bilaterally.  Right-sided venous stasis dermatitis.  No cellulitic changes or asymmetries.  Skin:    General: Skin is dry.  Neurological:     General: No focal deficit present.     Mental Status: He is alert.     GCS: GCS eye subscore is 4. GCS verbal subscore is 5. GCS motor subscore is 6.  Psychiatric:        Mood and Affect: Mood normal.        Behavior: Behavior normal.     Comments: Mildly confused.     ED Results / Procedures / Treatments   Labs (all labs ordered are listed, but only abnormal results are displayed) Labs Reviewed  CBC WITH DIFFERENTIAL/PLATELET - Abnormal; Notable for the following components:      Result Value   MCV 105.6 (*)    nRBC 1.1 (*)    Monocytes Absolute 1.3 (*)    All other components  within normal limits  COMPREHENSIVE METABOLIC PANEL - Abnormal; Notable for the following components:   Chloride 97 (*)    Glucose, Bld 111 (*)    BUN 46 (*)    Creatinine, Ser 1.28 (*)    Calcium 8.5 (*)    Albumin 2.9 (*)    AST 70 (*)    ALT 99 (*)    All other components within normal limits  BRAIN NATRIURETIC PEPTIDE - Abnormal; Notable for the following components:   B Natriuretic Peptide 1,118.0 (*)  All other components within normal limits  I-STAT CHEM 8, ED - Abnormal; Notable for the following components:   Sodium 133 (*)    Potassium 5.2 (*)    BUN 59 (*)    Glucose, Bld 106 (*)    Calcium, Ion 0.77 (*)    All other components within normal limits  CULTURE, BLOOD (ROUTINE X 2)  CULTURE, BLOOD (ROUTINE X 2)  SARS CORONAVIRUS 2 (TAT 6-24 HRS)  LACTIC ACID, PLASMA  AMMONIA  URINALYSIS, ROUTINE W REFLEX MICROSCOPIC    EKG EKG Interpretation  Date/Time:  Saturday November 04 2020 15:07:15 EDT Ventricular Rate:  158 PR Interval:    QRS Duration: 102 QT Interval:  321 QTC Calculation: 521 R Axis:   100 Text Interpretation: Ectopic atrial tachycardia, unifocal Right axis deviation Nonspecific T abnormalities, inferior leads Minimal ST elevation, inferior leads Prolonged QT interval Since last tracing rate faster c/w aflutter wiht 2:1 block likely Confirmed by Noemi Chapel [phone]) on 11/04/2020 3:18:19 PM   EKG Interpretation  Date/Time:  Saturday November 04 2020 16:32:15 EDT Ventricular Rate:  78 PR Interval:    QRS Duration: 158 QT Interval:  428 QTC Calculation: 488 R Axis:   97 Text Interpretation: Atrial flutter with predominant 4:1 AV block RBBB and LPFB Confirmed by Noemi Chapel [phone]) on 11/04/2020 4:51:59 PM        Radiology DG Chest Port 1 View  Result Date: 11/04/2020 CLINICAL DATA:  Shortness of breath. EXAM: PORTABLE CHEST 1 VIEW COMPARISON:  Radiograph and CT 01/08/2019 FINDINGS: Progressive right pleural effusion and pleuroparenchymal opacity  at the right lung base from prior exam. The heart is mildly enlarged. There is mild left paratracheal soft tissue thickening. Bronchial thickening appears chronic. Possible scattered pulmonary nodules in both lungs. No pneumothorax. Remote appearing right rib fractures. IMPRESSION: 1. Progressive right pleural effusion and pleuroparenchymal opacity at the right lung base from 2020 exam. 2. Mild left paratracheal soft tissue thickening, may be vascular structures or adenopathy. 3. Possible scattered pulmonary nodules in both lungs. 4. Chest CT (with IV contrast) is recommended for further evaluation. Electronically Signed   By: Keith Rake M.D.   On: 11/04/2020 15:19    Procedures .Critical Care Performed by: Corena Herter, PA-C Authorized by: Corena Herter, PA-C   Critical care provider statement:    Critical care time (minutes):  60   Critical care was necessary to treat or prevent imminent or life-threatening deterioration of the following conditions:  Cardiac failure and respiratory failure   Critical care was time spent personally by me on the following activities:  Discussions with consultants, evaluation of patient's response to treatment, examination of patient, ordering and performing treatments and interventions, ordering and review of laboratory studies, ordering and review of radiographic studies, pulse oximetry, re-evaluation of patient's condition, obtaining history from patient or surrogate and review of old charts Comments:     Hypoxia, new onset CHF, concern for cardiogenic shock given RVR to 150s and hypotension     Medications Ordered in ED Medications  diltiazem (CARDIZEM) injection 20 mg (20 mg Intravenous Given 11/04/20 1549)  furosemide (LASIX) injection 40 mg (40 mg Intravenous Given 11/04/20 1551)    ED Course  I have reviewed the triage vital signs and the nursing notes.  Pertinent labs & imaging results that were available during my care of the patient  were reviewed by me and considered in my medical decision making (see chart for details).    MDM Rules/Calculators/A&P  NYZIER BOIVIN was evaluated in Emergency Department on 11/04/2020 for the symptoms described in the history of present illness. He was evaluated in the context of the global COVID-19 pandemic, which necessitated consideration that the patient might be at risk for infection with the SARS-CoV-2 virus that causes COVID-19. Institutional protocols and algorithms that pertain to the evaluation of patients at risk for COVID-19 are in a state of rapid change based on information released by regulatory bodies including the CDC and federal and state organizations. These policies and algorithms were followed during the patient's care in the ED.  I personally reviewed patient's medical chart and all notes from triage and staff during today's encounter. I have also ordered and reviewed all labs and imaging that I felt to be medically necessary in the evaluation of this patient's complaints and with consideration of their physical exam. If needed, translation services were available and utilized.   While patient was initially in here for ongoing constipation issues, he noted that he had been feeling significantly weak and fatigued that has been progressive x2 weeks.  He is significantly fluid overloaded on exam and endorses weight gain and fatigue.  He has a tachyarrhythmia with heart rate elevated 150s.  Denies any associated chest pain.  Chest x-ray with progressive right-sided pleural effusion and pleural parenchymal opacity at the right lung base when compared to prior imaging in 2020.  There are also possible scattered pulmonary nodules in both lungs.    Patient with BNP elevated to 1118 here in the ED.  Mild AKI with creatinine elevated 1.28, up from normal limits and prior labs.  Blood cultures were obtained given patient's tachycardia and hypertension, however low  suspicion for infection as etiology for his vital sign abnormalities.    After Cardizem and Lasix was initiated, patient's tachyarrhythmia improved and he is now rate controlled.  Will obtain repeat EKG for better clarification of rhythm.  His pressures are persistently around 100/70.  He is still requiring 3 L supplemental oxygen via [place] to maintain saturation at 90%.  Spoke with Dr. Stanford Breed who will admit patient at [facility] to CCU bed.   Final Clinical Impression(s) / ED Diagnoses Final diagnoses:  Acute congestive heart failure, unspecified heart failure type [facility])  Hypoxic    Rx / DC Orders ED Discharge Orders    None       Corena Herter, PA-C 11/04/20 1619    Corena Herter, PA-C 11/04/20 1653    Noemi Chapel, MD 11/04/20 [phone]

## 2020-11-04 NOTE — Progress Notes (Signed)
Pt arrived to [facility] via [place]. Received by primary RN Wells Guiles.

## 2020-11-05 ENCOUNTER — Inpatient Hospital Stay (HOSPITAL_COMMUNITY): Payer: 59

## 2020-11-05 DIAGNOSIS — J449 Chronic obstructive pulmonary disease, unspecified: Secondary | ICD-10-CM

## 2020-11-05 DIAGNOSIS — I483 Typical atrial flutter: Secondary | ICD-10-CM | POA: Diagnosis not present

## 2020-11-05 DIAGNOSIS — F1010 Alcohol abuse, uncomplicated: Secondary | ICD-10-CM

## 2020-11-05 DIAGNOSIS — I5082 Biventricular heart failure: Secondary | ICD-10-CM

## 2020-11-05 DIAGNOSIS — I4892 Unspecified atrial flutter: Secondary | ICD-10-CM | POA: Diagnosis not present

## 2020-11-05 DIAGNOSIS — I509 Heart failure, unspecified: Secondary | ICD-10-CM

## 2020-11-05 DIAGNOSIS — Z720 Tobacco use: Secondary | ICD-10-CM

## 2020-11-05 LAB — BASIC METABOLIC PANEL
Anion gap: 8 (ref 5–15)
BUN: 36 mg/dL — ABNORMAL HIGH (ref 6–20)
CO2: 33 mmol/L — ABNORMAL HIGH (ref 22–32)
Calcium: 8.5 mg/dL — ABNORMAL LOW (ref 8.9–10.3)
Chloride: 96 mmol/L — ABNORMAL LOW (ref 98–111)
Creatinine, Ser: 1.01 mg/dL (ref 0.61–1.24)
GFR, Estimated: >60 mL/min (ref >60)
Glucose, Bld: 133 mg/dL — ABNORMAL HIGH (ref 70–99)
Potassium: 4.5 mmol/L (ref 3.5–5.1)
Sodium: 137 mmol/L (ref 135–145)

## 2020-11-05 LAB — CBC
HCT: 50 % (ref 39.0–52.0)
Hemoglobin: 15.7 g/dL (ref 13.0–17.0)
MCH: 32.9 pg (ref 26.0–34.0)
MCHC: 31.4 g/dL (ref 30.0–36.0)
MCV: 104.8 fL — ABNORMAL HIGH (ref 80.0–100.0)
Platelets: 276 10*3/uL (ref 150–400)
RBC: 4.77 MIL/uL (ref 4.22–5.81)
RDW: 13.5 % (ref 11.5–15.5)
WBC: 9.3 10*3/uL (ref 4.0–10.5)
nRBC: 1 % — ABNORMAL HIGH (ref 0.0–0.2)

## 2020-11-05 LAB — TSH: TSH: 1.843 u[IU]/mL (ref 0.350–4.500)

## 2020-11-05 LAB — MRSA PCR SCREENING: MRSA by PCR: NEGATIVE

## 2020-11-05 LAB — ECHOCARDIOGRAM COMPLETE BUBBLE STUDY
Height: 70 in
S' Lateral: 4.4 cm
Weight: 2952.4 oz

## 2020-11-05 LAB — PROTIME-INR
INR: 1.8 — ABNORMAL HIGH (ref 0.8–1.2)
Prothrombin Time: 19.8 seconds — ABNORMAL HIGH (ref 11.4–15.2)

## 2020-11-05 LAB — HIV ANTIBODY (ROUTINE TESTING W REFLEX): HIV Screen 4th Generation wRfx: NONREACTIVE

## 2020-11-05 LAB — SARS CORONAVIRUS 2 (TAT 6-24 HRS): SARS Coronavirus 2: NEGATIVE

## 2020-11-05 MED ORDER — ONDANSETRON HCL 4 MG/2ML IJ SOLN: 4.0000 mg | Freq: Four times a day (QID) | INTRAMUSCULAR | Status: DC | PRN

## 2020-11-05 MED ORDER — SODIUM CHLORIDE 0.9% FLUSH
10.0000 mL | Freq: Once | INTRAVENOUS | Status: AC
  Administered 2020-11-05: 10 mL via INTRAVENOUS

## 2020-11-05 MED ORDER — MAGNESIUM HYDROXIDE 400 MG/5ML PO SUSP
30.0000 mL | Freq: Every day | ORAL | Status: DC | PRN
  Administered 2020-11-05: 30 mL via ORAL
  Filled 2020-11-05: qty 30

## 2020-11-05 MED ORDER — ACETAMINOPHEN 325 MG PO TABS
650.0000 mg | ORAL_TABLET | ORAL | Status: DC | PRN
  Administered 2020-11-05 – 2020-11-07 (×3): 650 mg via ORAL
  Filled 2020-11-05 (×3): qty 2

## 2020-11-05 MED ORDER — DILTIAZEM HCL-DEXTROSE 125-5 MG/125ML-% IV SOLN (PREMIX)
5.0000 mg/h | INTRAVENOUS | Status: DC
  Administered 2020-11-05: 5 mg/h via INTRAVENOUS
  Filled 2020-11-05: qty 125

## 2020-11-05 MED ORDER — LORAZEPAM 2 MG/ML IJ SOLN
1.0000 mg | INTRAMUSCULAR | Status: DC | PRN
Start: 2020-11-05 — End: 2020-11-07
  Administered 2020-11-05 – 2020-11-06 (×4): 1 mg via INTRAVENOUS
  Filled 2020-11-05 (×4): qty 1

## 2020-11-05 MED ORDER — BISOPROLOL FUMARATE 5 MG PO TABS
5.0000 mg | ORAL_TABLET | Freq: Every day | ORAL | Status: DC
  Administered 2020-11-05 – 2020-11-14 (×10): 5 mg via ORAL
  Filled 2020-11-05 (×10): qty 1

## 2020-11-05 MED ORDER — APIXABAN 5 MG PO TABS
5.0000 mg | ORAL_TABLET | Freq: Two times a day (BID) | ORAL | Status: DC
  Administered 2020-11-05 – 2020-11-14 (×19): 5 mg via ORAL
  Filled 2020-11-05 (×19): qty 1

## 2020-11-05 MED ORDER — NICOTINE 21 MG/24HR TD PT24
21.0000 mg | MEDICATED_PATCH | Freq: Every day | TRANSDERMAL | Status: DC
  Administered 2020-11-05 – 2020-11-13 (×9): 21 mg via TRANSDERMAL
  Filled 2020-11-05 (×9): qty 1

## 2020-11-05 NOTE — Progress Notes (Addendum)
Progress Note  Patient Name: Jordan Kelly Date of Encounter: 11/05/2020  [facility] Cardiologist: No primary care provider on file. new  Subjective   Calm. Relaxed. O2 sat normal while awake on O2, drops with O2 off or when mouth breathing while asleep. No snoring while I observed him sleep. Denies angina. Not aware of palpitations. Fatigue was dominant complaint, rather than dyspnea. History of treatment for varicose veins, but no known DVT.  Inpatient Medications    Scheduled Meds: . apixaban  5 mg Oral BID  . Chlorhexidine Gluconate Cloth  6 each Topical Q0600  . influenza vac split quadrivalent PF  0.5 mL Intramuscular Tomorrow-1000  . mouth rinse  15 mL Mouth Rinse BID  . pneumococcal 23 valent vaccine  0.5 mL Intramuscular Tomorrow-1000   Continuous Infusions: . diltiazem (CARDIZEM) infusion 5 mg/hr (11/05/20 0800)   PRN Meds: acetaminophen, LORazepam, ondansetron (ZOFRAN) IV   Vital Signs    Vitals:   11/05/20 0600 11/05/20 0700 11/05/20 0745 11/05/20 0800  BP: 102/75 102/71  100/73  Pulse: 76 76  76  Resp: 19 18  20   Temp:   97.9 F (36.6 C)   TempSrc:      SpO2: 96% 96%  96%  Weight:      Height:        Intake/Output Summary (Last 24 hours) at 11/05/2020 0848 Last data filed at 11/05/2020 0800 Gross per 24 hour  Intake 33.69 ml  Output 900 ml  Net -866.31 ml   Last 3 Weights 11/04/2020 11/04/2020 10/30/2020  Weight (lbs) 184 lb 8.4 oz 192 lb 192 lb  Weight (kg) 83.7 kg 87.091 kg 87.091 kg      Telemetry    AFlutter, rate controlled - Personally Reviewed  ECG    AFlutter w variable AV block, mostly 4:1 - Personally Reviewed  Physical Exam   GEN: No acute distress.   Neck: No JVD Cardiac: irregular, no murmurs, rubs, or gallops.  Respiratory: Clear to auscultation bilaterally. GI: Soft, nontender, non-distended  MS: 2-3+ soft pitting pretibial edema; No deformity. Neuro:  Nonfocal  Psych: Normal affect   Labs    High Sensitivity  Troponin:  No results for input(s): TROPONINIHS in the last 720 hours.    Chemistry Recent Labs  Lab 11/04/20 1514 11/04/20 1540  NA 137 133*  K 4.3 5.2*  CL 97* 102  CO2 30  --   GLUCOSE 111* 106*  BUN 46* 59*  CREATININE 1.28* 1.20  CALCIUM 8.5*  --   PROT 7.0  --   ALBUMIN 2.9*  --   AST 70*  --   ALT 99*  --   ALKPHOS 108  --   BILITOT 0.9  --   GFRNONAA >60  --   ANIONGAP 10  --      Hematology Recent Labs  Lab 11/04/20 1514 11/04/20 1540  WBC 8.5  --   RBC 4.66  --   HGB 15.3 16.7  HCT 49.2 49.0  MCV 105.6*  --   MCH 32.8  --   MCHC 31.1  --   RDW 13.4  --   PLT 306  --     BNP Recent Labs  Lab 11/04/20 1514  BNP 1,118.0*     DDimer No results for input(s): DDIMER in the last 168 hours.   Radiology    DG Chest Port 1 View  Result Date: 11/04/2020 CLINICAL DATA:  Shortness of breath. EXAM: PORTABLE CHEST 1 VIEW COMPARISON:  Radiograph  and CT 01/08/2019 FINDINGS: Progressive right pleural effusion and pleuroparenchymal opacity at the right lung base from prior exam. The heart is mildly enlarged. There is mild left paratracheal soft tissue thickening. Bronchial thickening appears chronic. Possible scattered pulmonary nodules in both lungs. No pneumothorax. Remote appearing right rib fractures. IMPRESSION: 1. Progressive right pleural effusion and pleuroparenchymal opacity at the right lung base from 2020 exam. 2. Mild left paratracheal soft tissue thickening, may be vascular structures or adenopathy. 3. Possible scattered pulmonary nodules in both lungs. 4. Chest CT (with IV contrast) is recommended for further evaluation. Electronically Signed   By: Keith Rake M.D.   On: 11/04/2020 15:19    Cardiac Studies   Echo pending  Patient Profile     60 y.o. male smoker with a history of heavy alcohol use presents with edema, fatigue, found to have atrial flutter, R pleural effusion, cardiomegaly, prerenal azotemia, elevated BNP and mildly elevated  LFTs  Assessment & Plan    1. Suspected CHF: consider tachycardia cardiomyopathy or alcoholic cardiomyopathy. Note absence of angina and absence or aortic atherosclerosis on CT chest 2020 (although on my review I believe there is some LAD artery calcified plaque). Echo pending, expect will see biventricular global hypokinesis. Diuretics, rate control for now. Further assessment and therapy based on echo. 2. AFlutter: well rate controlled now on diltiazem, surprisingly low dose. Check TSH. Transition to oral beta blockers. Try to avoid amio with history of heavy alcohol use and elevated LFTs. Consider early TEE-DCCV. Apixaban started early this AM. 3. Alcoholism: watch carefully for withdrawal. Last ETOH intake now >24 hours. Keep in ICU. 4. Abnormal LFTs:  Due to alcohol versus passive congestion from CHF. 5. AKI: prerenal due to low CO versus liver disease. 6. COPD: heavy smoker. No wheezing currently. Bronchodilators. Use beta-1-selective beta blockers.  For questions or updates, please contact [place] Please consult [email or web address] for contact info under        Signed, Sanda Klein, MD  11/05/2020, 8:48 AM     ADDENDUM: bedside review of the echo shows disproportionate enlargement of the right heart chambers, more significant RV dysfunction (moderate) than LV dysfunction (mild) and RV volume/pressure overload. There is saline contrast evidence of an atrial septal defect with bidirectional shunting. Small-medium size ASD may be the initial abnormality leading to right atrial dilation and secondary atrial flutter, with subsequent mild tachycardia-related LV cardiomyopathy. Will need a TEE for full evaluation. Will make NPO after midnight in case there is a cancellation on TEE schedule tomorrow (appears to be currently full).

## 2020-11-05 NOTE — Progress Notes (Signed)
  Echocardiogram 2D Echocardiogram with bubble study and 3D has been performed.  Darlina Sicilian M 11/05/2020, 10:25 AM

## 2020-11-06 DIAGNOSIS — Z720 Tobacco use: Secondary | ICD-10-CM | POA: Diagnosis not present

## 2020-11-06 DIAGNOSIS — F1010 Alcohol abuse, uncomplicated: Secondary | ICD-10-CM | POA: Diagnosis not present

## 2020-11-06 DIAGNOSIS — I483 Typical atrial flutter: Secondary | ICD-10-CM | POA: Diagnosis not present

## 2020-11-06 DIAGNOSIS — L259 Unspecified contact dermatitis, unspecified cause: Secondary | ICD-10-CM

## 2020-11-06 DIAGNOSIS — I5082 Biventricular heart failure: Secondary | ICD-10-CM | POA: Diagnosis not present

## 2020-11-06 DIAGNOSIS — I519 Heart disease, unspecified: Secondary | ICD-10-CM | POA: Diagnosis not present

## 2020-11-06 DIAGNOSIS — E46 Unspecified protein-calorie malnutrition: Secondary | ICD-10-CM

## 2020-11-06 DIAGNOSIS — J449 Chronic obstructive pulmonary disease, unspecified: Secondary | ICD-10-CM | POA: Diagnosis not present

## 2020-11-06 DIAGNOSIS — E7220 Disorder of urea cycle metabolism, unspecified: Secondary | ICD-10-CM

## 2020-11-06 LAB — BASIC METABOLIC PANEL
Anion gap: 5 (ref 5–15)
BUN: 40 mg/dL — ABNORMAL HIGH (ref 6–20)
CO2: 34 mmol/L — ABNORMAL HIGH (ref 22–32)
Calcium: 8.4 mg/dL — ABNORMAL LOW (ref 8.9–10.3)
Chloride: 97 mmol/L — ABNORMAL LOW (ref 98–111)
Creatinine, Ser: 1.26 mg/dL — ABNORMAL HIGH (ref 0.61–1.24)
GFR, Estimated: >60 mL/min (ref >60)
Glucose, Bld: 121 mg/dL — ABNORMAL HIGH (ref 70–99)
Potassium: 4.5 mmol/L (ref 3.5–5.1)
Sodium: 136 mmol/L (ref 135–145)

## 2020-11-06 MED ORDER — FUROSEMIDE 10 MG/ML IJ SOLN: 20.0000 mg | Freq: Every day | INTRAMUSCULAR | Status: DC

## 2020-11-06 MED ORDER — INFLUENZA VAC SPLIT QUAD 0.5 ML IM SUSY: 0.5000 mL | PREFILLED_SYRINGE | INTRAMUSCULAR | Status: DC

## 2020-11-06 MED ORDER — SENNOSIDES-DOCUSATE SODIUM 8.6-50 MG PO TABS
1.0000 | ORAL_TABLET | Freq: Two times a day (BID) | ORAL | Status: DC
  Administered 2020-11-06: 1 via ORAL
  Filled 2020-11-06: qty 1

## 2020-11-06 MED ORDER — HYDROCORTISONE 1 % EX CREA
1.0000 "application " | TOPICAL_CREAM | Freq: Two times a day (BID) | CUTANEOUS | Status: DC
  Administered 2020-11-06 – 2020-11-12 (×13): 1 via TOPICAL
  Filled 2020-11-06 (×3): qty 28

## 2020-11-06 MED ORDER — PNEUMOCOCCAL VAC POLYVALENT 25 MCG/0.5ML IJ INJ: 0.5000 mL | INJECTION | INTRAMUSCULAR | Status: DC

## 2020-11-06 MED ORDER — ALBUTEROL SULFATE (2.5 MG/3ML) 0.083% IN NEBU: 2.5000 mg | INHALATION_SOLUTION | Freq: Four times a day (QID) | RESPIRATORY_TRACT | Status: DC | PRN

## 2020-11-06 MED ORDER — LORAZEPAM 1 MG PO TABS: 0.0000 mg | ORAL_TABLET | Freq: Two times a day (BID) | ORAL | Status: DC

## 2020-11-06 MED ORDER — LACTULOSE 10 GM/15ML PO SOLN
10.0000 g | Freq: Once | ORAL | Status: AC
  Administered 2020-11-06: 10 g via ORAL
  Filled 2020-11-06: qty 15

## 2020-11-06 MED ORDER — KETOCONAZOLE 2 % EX CREA
TOPICAL_CREAM | Freq: Every day | CUTANEOUS | Status: DC
  Filled 2020-11-06: qty 15

## 2020-11-06 MED ORDER — SODIUM CHLORIDE 0.9 % IV SOLN: INTRAVENOUS | Status: DC

## 2020-11-06 MED ORDER — LORAZEPAM 1 MG PO TABS
0.0000 mg | ORAL_TABLET | Freq: Four times a day (QID) | ORAL | Status: DC
  Administered 2020-11-06 – 2020-11-07 (×2): 2 mg via ORAL
  Filled 2020-11-06 (×2): qty 2

## 2020-11-06 MED ORDER — NYSTATIN 100000 UNIT/GM EX CREA
TOPICAL_CREAM | Freq: Two times a day (BID) | CUTANEOUS | Status: DC
  Administered 2020-11-06 – 2020-11-12 (×4): 1 via TOPICAL
  Filled 2020-11-06 (×2): qty 15

## 2020-11-06 MED FILL — Diltiazem HCl 100 MG/100ML in Dextrose 5% IV Soln (1 MG/ML): INTRAVENOUS | Qty: 100 | Status: AC

## 2020-11-06 NOTE — Consult Note (Addendum)
[facility] Medical Consultation  Jordan Kelly DXI:[id number] DOB: 1960/11/23 DOA: 11/04/2020 PCP: Noreene Larsson, NP   Requesting physician: Renella Cunas, MD Date of consultation: 11/06/2020 Reason for consultation: Medical management rash and  Impression/Recommendations   1. Atrial flutter: Patient appears relatively rate controlled on bisprolol.  He was started on anticoagulation of Eliquis with plans for TEE tomorrow a.m. -Per cardiology  2. Systolic CHF: Patient still with some lower extremity edema.  BNP was initially 1119 echocardiogram revealed EF of 45 to 50%. Currently being diuresed with IV Lasix. -Per cardiology  3. COPD exacerbation  tobacco abuse -Continue nicotine patch -Continue albuterol nebs as needed for shortness of breath/wheezing  -Consider short course of prednisone if patient appreciated to wean  4. Contact dermatitis cutaneous candidiasis: Patient complains of having itchy rash for quite some time.  Rash of the inguinal region is more concerning for cutaneous candidiasis.  Rash of the hip and back appears to possibly more so a contact dermatitis.  Unclear the possible trigger.  No central clearing appreciated which would give more concerned for tenia corporis -Continue nystatin cream for the inguinal region -Try hydrocortisone  cream for left hip and back if rash seems to worsen consider yeast or fungal cause  5. Alcohol abuse transaminitis: Patient reports drinking a sixpack of beer per day on average.  On admission AST was 70 and ALT 99.  Symptoms thought possibly related with alcohol abuse and/or passive hepatic congestion.  CIWA scores have been elevated. -Continue CIWA protocols and added on scheduled oral Ativan in addition to as needed IV  6. Acute kidney injury: Creatinine 1.26 with BUN 40.  Symptoms appear to be prerenal in nature, but suspect secondary to hypoperfusion from heart failure. -Continue to monitor kidney function with  diarrhea  7. Constipation: Patient was given lactulose 10 g orally and subsequently had a bowel movement today.  Suspect functional component patient's symptoms. -Senokot-S daily        I will followup again tomorrow. Please contact me if I can be of assistance in the meanwhile. Thank you for this consultation.  Chief Complaint: Constipation  HPI:  Jordan Kelly is a 60 y.o. male with medical history significant of tobacco and alcohol abuse who initially presented with complaints of constipation, fatigue, and edema on 3/26.  Patient was initially found to be in atrial flutter.  Labs were significant for BUN 46, creatinine 1.28, AST 70, ALT 99, and BNP 1119.  Patient was initially started on Cardizem drip, but was able to be switched over to oral fistful.  Patient admits to drinking a sixpack of beer on average per day.  Patient still reportedly complaints of constipation.  Also reports having an itchy rash that comes and goes and has been present for quite some time of his side, back, and inguinal region.  Rash gets worse with heat.  Currently denies any chest pain and was able to have a bowel movement after being given lactulose.  Review of Systems: Review of Systems  Cardiovascular: Negative for palpitations.  Gastrointestinal: Negative for abdominal pain.  Skin: Positive for itching and rash.  Psychiatric/Behavioral: Positive for substance abuse.   Past Medical History:  Diagnosis Date  . COPD (chronic obstructive pulmonary disease) ([place])   . Depression    Past Surgical History:  Procedure Laterality Date  . HERNIA REPAIR     right inguinal hernia   Social History:  reports that he has been smoking cigarettes. He has been smoking about 1.00  pack per day. He has never used smokeless tobacco. He reports current alcohol use of about 42.0 standard drinks of alcohol per week. He reports current drug use. Frequency: 5.00 times per week. Drugs: Marijuana and Benzodiazepines.  No Known  Allergies History reviewed. No pertinent family history.  Prior to Admission medications   Medication Sig Start Date End Date Taking? Authorizing Provider  albuterol (VENTOLIN HFA) 108 (90 Base) MCG/ACT inhaler Inhale 2 puffs into the lungs every 6 (six) hours as needed for wheezing or shortness of breath. Dx J44.1--- COPD 01/10/19  Yes Emokpae, Courage, MD  bisacodyl (DULCOLAX) 10 MG suppository Place 1 suppository (10 mg total) rectally as needed for moderate constipation. 10/30/20  Yes Noreene Larsson, NP  furosemide (LASIX) 40 MG tablet Take 1 tablet (40 mg total) by mouth daily. 10/30/20  Yes Noreene Larsson, NP  guaiFENesin (MUCINEX) 600 MG 12 hr tablet Take 1 tablet (600 mg total) by mouth 2 (two) times daily. 01/10/19  Yes Emokpae, Courage, MD  Multiple Vitamin (MULTIVITAMIN WITH MINERALS) TABS tablet Take 1 tablet by mouth daily. 01/10/19  Yes Emokpae, Courage, MD  ondansetron (ZOFRAN-ODT) 4 MG disintegrating tablet Take 1 tablet (4 mg total) by mouth every 8 (eight) hours as needed for nausea or vomiting. 10/30/20  Yes Noreene Larsson, NP  sodium phosphate (FLEET) 7-19 GM/118ML ENEM Place 133 mLs (1 enema total) rectally daily as needed for severe constipation. 10/30/20  Yes Noreene Larsson, NP   Physical Exam:  Constitutional: Middle-age male who appears to be in no acute distress Vitals:   11/06/20 0754 11/06/20 0800 11/06/20 0900 11/06/20 1129  BP:  103/82 103/82   Pulse:  73 90   Resp:  20 19   Temp: 98.3 F (36.8 C)   98.2 F (36.8 C)  TempSrc: Oral     SpO2:  96% 92%   Weight:      Height:       Eyes: PERRL, lids and conjunctivae normal ENMT: Mucous membranes are moist. Posterior pharynx clear of any exudate or lesions. .  Neck: normal, supple, no masses, no thyromegaly Respiratory: Normal respiratory effort no expiratory wheezes appreciated at this time. Cardiovascular: Irregularly irregular, no murmurs / rubs / gallops. 1-2 pitting lower extremity edema. 2+ pedal pulses. No  carotid bruits.  Abdomen: no tenderness, no masses palpated. No hepatosplenomegaly. Bowel sounds positive.  Musculoskeletal: no clubbing / cyanosis. No joint deformity upper and lower extremities. Good ROM, no contractures. Normal muscle tone.  Skin: Erythematous and moist appearing rash noted of the inguinal region.  Rash appears to wrap around to the right thigh and back as seen below.    Neurologic: CN 2-12 grossly intact. Sensation intact, DTR normal. Strength 5/5 in all 4.  Psychiatric: Normal judgment and insight. Alert and oriented x 3. Normal mood.   Labs on Admission:  Basic Metabolic Panel: Recent Labs  Lab 11/04/20 1514 11/04/20 1540 11/05/20 1041 11/06/20 0046  NA 137 133* 137 136  K 4.3 5.2* 4.5 4.5  CL 97* 102 96* 97*  CO2 30  --  33* 34*  GLUCOSE 111* 106* 133* 121*  BUN 46* 59* 36* 40*  CREATININE 1.28* 1.20 1.01 1.26*  CALCIUM 8.5*  --  8.5* 8.4*   Liver Function Tests: Recent Labs  Lab 11/04/20 1514  AST 70*  ALT 99*  ALKPHOS 108  BILITOT 0.9  PROT 7.0  ALBUMIN 2.9*   No results for input(s): LIPASE, AMYLASE in the last 168 hours. Recent  Labs  Lab 11/04/20 1538  AMMONIA 35   CBC: Recent Labs  Lab 11/04/20 1514 11/04/20 1540 11/05/20 1041  WBC 8.5  --  9.3  NEUTROABS 5.6  --   --   HGB 15.3 16.7 15.7  HCT 49.2 49.0 50.0  MCV 105.6*  --  104.8*  PLT 306  --  276   Cardiac Enzymes: No results for input(s): CKTOTAL, CKMB, CKMBINDEX, TROPONINI in the last 168 hours. BNP: Invalid input(s): POCBNP CBG: No results for input(s): GLUCAP in the last 168 hours.  Radiological Exams on Admission: DG Chest Port 1 View  Result Date: 11/04/2020 CLINICAL DATA:  Shortness of breath. EXAM: PORTABLE CHEST 1 VIEW COMPARISON:  Radiograph and CT 01/08/2019 FINDINGS: Progressive right pleural effusion and pleuroparenchymal opacity at the right lung base from prior exam. The heart is mildly enlarged. There is mild left paratracheal soft tissue thickening.  Bronchial thickening appears chronic. Possible scattered pulmonary nodules in both lungs. No pneumothorax. Remote appearing right rib fractures. IMPRESSION: 1. Progressive right pleural effusion and pleuroparenchymal opacity at the right lung base from 2020 exam. 2. Mild left paratracheal soft tissue thickening, may be vascular structures or adenopathy. 3. Possible scattered pulmonary nodules in both lungs. 4. Chest CT (with IV contrast) is recommended for further evaluation. Electronically Signed   By: Keith Rake M.D.   On: 11/04/2020 15:19   ECHOCARDIOGRAM COMPLETE BUBBLE STUDY  Result Date: 11/05/2020    ECHOCARDIOGRAM REPORT   Patient Name:   Jordan Kelly Date of Exam: 11/05/2020 Medical Rec #:  [id number]     Height:       70.0 in Accession #:    [id number]    Weight:       184.5 lb Date of Birth:  03-09-1961      BSA:          2.017 m Patient Age:    24 years      BP:           105/80 mmHg Patient Gender: M             HR:           76 bpm. Exam Location:  Inpatient Procedure: 2D Echo, Cardiac Doppler, Color Doppler, 3D Echo and Saline Contrast            Bubble Study Indications:    Atrial Flutter I48.92  History:        Patient has no prior history of Echocardiogram examinations.                 COPD; Risk Factors:Current Smoker.  Sonographer:    Darlina Sicilian RDCS Referring Phys: [phone] MIHAI CROITORU  Sonographer Comments: Global longitudinal strain was attempted. IMPRESSIONS  1. Left ventricular ejection fraction, by estimation, is 45 to 50%. The left ventricle has mildly decreased function. The left ventricle demonstrates global hypokinesis. Left ventricular diastolic function could not be evaluated. There is the interventricular septum is flattened in diastole ('D' shaped left ventricle), consistent with right ventricular volume overload. The average left ventricular global longitudinal strain is -12.0 %. The global longitudinal strain is abnormal.  2. Right ventricular systolic function is  moderately reduced. The right ventricular size is moderately enlarged. There is mildly elevated pulmonary artery systolic pressure.  3. Right atrial size was severely dilated.  4. The mitral valve is normal in structure. No evidence of mitral valve regurgitation.  5. The aortic valve is tricuspid. Aortic valve regurgitation is not visualized. No aortic  stenosis is present.  6. The inferior vena cava is dilated in size with <50% respiratory variability, suggesting right atrial pressure of 15 mmHg.  7. Evidence of atrial level shunting detected by color flow Doppler. There is a small secundum atrial septal defect with bidirectional shunting across the atrial septum. Comparison(s): No prior Echocardiogram. Conclusion(s)/Recommendation(s): Transesophageal echocardiogram to assess the atrial septal defect. FINDINGS  Left Ventricle: Left ventricular ejection fraction, by estimation, is 45 to 50%. The left ventricle has mildly decreased function. The left ventricle demonstrates global hypokinesis. The average left ventricular global longitudinal strain is -12.0 %. The global longitudinal strain is abnormal. The left ventricular internal cavity size was normal in size. There is no left ventricular hypertrophy. The interventricular septum is flattened in diastole ('D' shaped left ventricle), consistent with right ventricular volume overload. Left ventricular diastolic function could not be evaluated due to atrial fibrillation. Left ventricular diastolic function could not be evaluated. Right Ventricle: The right ventricular size is moderately enlarged. No increase in right ventricular wall thickness. Right ventricular systolic function is moderately reduced. There is mildly elevated pulmonary artery systolic pressure. The tricuspid regurgitant velocity is 2.58 m/s, and with an assumed right atrial pressure of 15 mmHg, the estimated right ventricular systolic pressure is 18.8 mmHg. Left Atrium: Left atrial size was normal in  size. Right Atrium: Right atrial size was severely dilated. Pericardium: There is no evidence of pericardial effusion. Mitral Valve: The mitral valve is normal in structure. No evidence of mitral valve regurgitation. Tricuspid Valve: The tricuspid valve is normal in structure. Tricuspid valve regurgitation is mild. Aortic Valve: The aortic valve is tricuspid. Aortic valve regurgitation is not visualized. No aortic stenosis is present. Pulmonic Valve: The pulmonic valve was normal in structure. Pulmonic valve regurgitation is trivial. Aorta: The aortic root and ascending aorta are structurally normal, with no evidence of dilitation. Venous: The inferior vena cava is dilated in size with less than 50% respiratory variability, suggesting right atrial pressure of 15 mmHg. IAS/Shunts: Evidence of atrial level shunting detected by color flow Doppler. Agitated saline contrast was given intravenously to evaluate for intracardiac shunting. There is a small secundum atrial septal defect with bidirectional shunting across the atrial septum.  LEFT VENTRICLE PLAX 2D LVIDd:         5.30 cm LVIDs:         4.40 cm  2D Longitudinal Strain LV PW:         0.70 cm  2D Strain GLS Avg:     -12.0 % LV IVS:        0.70 cm LVOT diam:     2.00 cm LV SV:         47 LV SV Index:   24 LVOT Area:     3.14 cm  RIGHT VENTRICLE RV S prime:     7.40 cm/s RVOT diam:      2.54 cm TAPSE (M-mode): 1.1 cm LEFT ATRIUM             Index       RIGHT ATRIUM           Index LA diam:        3.60 cm 1.78 cm/m  RA Area:     28.56 cm LA Vol (A2C):   38.1 ml 18.89 ml/m RA Volume:   52.60 ml  26.08 ml/m LA Vol (A4C):   46.4 ml 23.00 ml/m LA Biplane Vol: 43.3 ml 21.47 ml/m  AORTIC VALVE  PULMONIC VALVE LVOT Vmax:   97.90 cm/s  RVOT Peak grad: 3 mmHg LVOT Vmean:  67.000 cm/s LVOT VTI:    0.151 m  AORTA Ao Root diam: 3.70 cm Ao Asc diam:  3.20 cm TRICUSPID VALVE TV VTI:         0.19 msec TR Peak grad:   26.6 mmHg TR Vmax:        258.00 cm/s  SHUNTS  Systemic VTI:  0.15 m Systemic Diam: 2.00 cm Pulmonic VTI:  0.132 m Pulmonic Diam: 2.54 cm Qp/Qs:         1.41 Mihai Croitoru MD Electronically signed by Sanda Klein MD Signature Date/Time: 11/05/2020/11:57:24 AM    Final     EKG: Independently reviewed.  Atrial flutter 4:1 at 78 bpm  Time spent: >45 minutes  [facility]   If 7PM-7AM, please contact night-coverage

## 2020-11-06 NOTE — Progress Notes (Signed)
Progress Note  Patient Name: Jordan Kelly Date of Encounter: 11/06/2020  Primary Cardiologist: No primary care provider on file.   Subjective   Felt fine overnight.  Notes constipation that was present on admission.  Notes a skin rash, worse with heat, that has lasted for years.  No SOB.  No palpitaitons.  Inpatient Medications    Scheduled Meds: . apixaban  5 mg Oral BID  . bisoprolol  5 mg Oral Daily  . Chlorhexidine Gluconate Cloth  6 each Topical Q0600  . influenza vac split quadrivalent PF  0.5 mL Intramuscular Tomorrow-1000  . lactulose  10 g Oral Once  . mouth rinse  15 mL Mouth Rinse BID  . nicotine  21 mg Transdermal Daily  . pneumococcal 23 valent vaccine  0.5 mL Intramuscular Tomorrow-1000   Continuous Infusions: . sodium chloride 20 mL/hr at 11/06/20 0700  . diltiazem (CARDIZEM) infusion Stopped (11/05/20 1456)   PRN Meds: acetaminophen, LORazepam, magnesium hydroxide, ondansetron (ZOFRAN) IV   Vital Signs    Vitals:   11/06/20 0550 11/06/20 0600 11/06/20 0700 11/06/20 0754  BP:  113/75 100/74   Pulse:   72   Resp:  (!) 21 19   Temp:    98.3 F (36.8 C)  TempSrc:    Oral  SpO2:  (!) 88% 95%   Weight: 83.5 kg     Height:        Intake/Output Summary (Last 24 hours) at 11/06/2020 0810 Last data filed at 11/06/2020 0700 Gross per 24 hour  Intake 256.16 ml  Output 250 ml  Net 6.16 ml   Filed Weights   11/04/20 1437 11/04/20 2330 11/06/20 0550  Weight: 87.1 kg 83.7 kg 83.5 kg    Telemetry    Atrial Flutter with spikes in rate but < 110 bpm - Personally Reviewed  ECG    No new - Personally Reviewed  Physical Exam   GEN: No acute distress.   Neck: No JVD Cardiac: Irregularly irregular rhythm, no murmurs, rubs, or gallops.  Respiratory: Expiratory wheezes with no crackles; good air movement GI: Soft, with slight non-tender distention, umbilical hernia noted MS: +2 edema; No deformity. Neuro:  Nonfocal, no asterixis Psych: Normal affect   Skin:  Red macular rash in intertriginous zone that wraps over right thigh   Labs    Chemistry Recent Labs  Lab 11/04/20 1514 11/04/20 1540 11/05/20 1041 11/06/20 0046  NA 137 133* 137 136  K 4.3 5.2* 4.5 4.5  CL 97* 102 96* 97*  CO2 30  --  33* 34*  GLUCOSE 111* 106* 133* 121*  BUN 46* 59* 36* 40*  CREATININE 1.28* 1.20 1.01 1.26*  CALCIUM 8.5*  --  8.5* 8.4*  PROT 7.0  --   --   --   ALBUMIN 2.9*  --   --   --   AST 70*  --   --   --   ALT 99*  --   --   --   ALKPHOS 108  --   --   --   BILITOT 0.9  --   --   --   GFRNONAA >60  --  >60 >60  ANIONGAP 10  --  8 5     Hematology Recent Labs  Lab 11/04/20 1514 11/04/20 1540 11/05/20 1041  WBC 8.5  --  9.3  RBC 4.66  --  4.77  HGB 15.3 16.7 15.7  HCT 49.2 49.0 50.0  MCV 105.6*  --  104.8*  MCH 32.8  --  32.9  MCHC 31.1  --  31.4  RDW 13.4  --  13.5  PLT 306  --  276    Cardiac EnzymesNo results for input(s): TROPONINI in the last 168 hours. No results for input(s): TROPIPOC in the last 168 hours.   BNP Recent Labs  Lab 11/04/20 1514  BNP 1,118.0*     DDimer No results for input(s): DDIMER in the last 168 hours.   Radiology    DG Chest Port 1 View  Result Date: 11/04/2020 CLINICAL DATA:  Shortness of breath. EXAM: PORTABLE CHEST 1 VIEW COMPARISON:  Radiograph and CT 01/08/2019 FINDINGS: Progressive right pleural effusion and pleuroparenchymal opacity at the right lung base from prior exam. The heart is mildly enlarged. There is mild left paratracheal soft tissue thickening. Bronchial thickening appears chronic. Possible scattered pulmonary nodules in both lungs. No pneumothorax. Remote appearing right rib fractures. IMPRESSION: 1. Progressive right pleural effusion and pleuroparenchymal opacity at the right lung base from 2020 exam. 2. Mild left paratracheal soft tissue thickening, may be vascular structures or adenopathy. 3. Possible scattered pulmonary nodules in both lungs. 4. Chest CT (with IV contrast)  is recommended for further evaluation. Electronically Signed   By: Keith Rake M.D.   On: 11/04/2020 15:19   ECHOCARDIOGRAM COMPLETE BUBBLE STUDY  Result Date: 11/05/2020    ECHOCARDIOGRAM REPORT   Patient Name:   VINOD MIKESELL Date of Exam: 11/05/2020 Medical Rec #:  [id number]     Height:       70.0 in Accession #:    [id number]    Weight:       184.5 lb Date of Birth:  1960-10-18      BSA:          2.017 m Patient Age:    78 years      BP:           105/80 mmHg Patient Gender: M             HR:           76 bpm. Exam Location:  Inpatient Procedure: 2D Echo, Cardiac Doppler, Color Doppler, 3D Echo and Saline Contrast            Bubble Study Indications:    Atrial Flutter I48.92  History:        Patient has no prior history of Echocardiogram examinations.                 COPD; Risk Factors:Current Smoker.  Sonographer:    Darlina Sicilian RDCS Referring Phys: [id number] MIHAI CROITORU  Sonographer Comments: Global longitudinal strain was attempted. IMPRESSIONS  1. Left ventricular ejection fraction, by estimation, is 45 to 50%. The left ventricle has mildly decreased function. The left ventricle demonstrates global hypokinesis. Left ventricular diastolic function could not be evaluated. There is the interventricular septum is flattened in diastole ('D' shaped left ventricle), consistent with right ventricular volume overload. The average left ventricular global longitudinal strain is -12.0 %. The global longitudinal strain is abnormal.  2. Right ventricular systolic function is moderately reduced. The right ventricular size is moderately enlarged. There is mildly elevated pulmonary artery systolic pressure.  3. Right atrial size was severely dilated.  4. The mitral valve is normal in structure. No evidence of mitral valve regurgitation.  5. The aortic valve is tricuspid. Aortic valve regurgitation is not visualized. No aortic stenosis is present.  6. The inferior vena cava is dilated in size with <50%  respiratory  variability, suggesting right atrial pressure of 15 mmHg.  7. Evidence of atrial level shunting detected by color flow Doppler. There is a small secundum atrial septal defect with bidirectional shunting across the atrial septum. Comparison(s): No prior Echocardiogram. Conclusion(s)/Recommendation(s): Transesophageal echocardiogram to assess the atrial septal defect. FINDINGS  Left Ventricle: Left ventricular ejection fraction, by estimation, is 45 to 50%. The left ventricle has mildly decreased function. The left ventricle demonstrates global hypokinesis. The average left ventricular global longitudinal strain is -12.0 %. The global longitudinal strain is abnormal. The left ventricular internal cavity size was normal in size. There is no left ventricular hypertrophy. The interventricular septum is flattened in diastole ('D' shaped left ventricle), consistent with right ventricular volume overload. Left ventricular diastolic function could not be evaluated due to atrial fibrillation. Left ventricular diastolic function could not be evaluated. Right Ventricle: The right ventricular size is moderately enlarged. No increase in right ventricular wall thickness. Right ventricular systolic function is moderately reduced. There is mildly elevated pulmonary artery systolic pressure. The tricuspid regurgitant velocity is 2.58 m/s, and with an assumed right atrial pressure of 15 mmHg, the estimated right ventricular systolic pressure is 06.3 mmHg. Left Atrium: Left atrial size was normal in size. Right Atrium: Right atrial size was severely dilated. Pericardium: There is no evidence of pericardial effusion. Mitral Valve: The mitral valve is normal in structure. No evidence of mitral valve regurgitation. Tricuspid Valve: The tricuspid valve is normal in structure. Tricuspid valve regurgitation is mild. Aortic Valve: The aortic valve is tricuspid. Aortic valve regurgitation is not visualized. No aortic stenosis is present.  Pulmonic Valve: The pulmonic valve was normal in structure. Pulmonic valve regurgitation is trivial. Aorta: The aortic root and ascending aorta are structurally normal, with no evidence of dilitation. Venous: The inferior vena cava is dilated in size with less than 50% respiratory variability, suggesting right atrial pressure of 15 mmHg. IAS/Shunts: Evidence of atrial level shunting detected by color flow Doppler. Agitated saline contrast was given intravenously to evaluate for intracardiac shunting. There is a small secundum atrial septal defect with bidirectional shunting across the atrial septum.  LEFT VENTRICLE PLAX 2D LVIDd:         5.30 cm LVIDs:         4.40 cm  2D Longitudinal Strain LV PW:         0.70 cm  2D Strain GLS Avg:     -12.0 % LV IVS:        0.70 cm LVOT diam:     2.00 cm LV SV:         47 LV SV Index:   24 LVOT Area:     3.14 cm  RIGHT VENTRICLE RV S prime:     7.40 cm/s RVOT diam:      2.54 cm TAPSE (M-mode): 1.1 cm LEFT ATRIUM             Index       RIGHT ATRIUM           Index LA diam:        3.60 cm 1.78 cm/m  RA Area:     28.56 cm LA Vol (A2C):   38.1 ml 18.89 ml/m RA Volume:   52.60 ml  26.08 ml/m LA Vol (A4C):   46.4 ml 23.00 ml/m LA Biplane Vol: 43.3 ml 21.47 ml/m  AORTIC VALVE             PULMONIC VALVE LVOT Vmax:   97.90 cm/s  RVOT Peak grad: 3 mmHg LVOT Vmean:  67.000 cm/s LVOT VTI:    0.151 m  AORTA Ao Root diam: 3.70 cm Ao Asc diam:  3.20 cm TRICUSPID VALVE TV VTI:         0.19 msec TR Peak grad:   26.6 mmHg TR Vmax:        258.00 cm/s  SHUNTS Systemic VTI:  0.15 m Systemic Diam: 2.00 cm Pulmonic VTI:  0.132 m Pulmonic Diam: 2.54 cm Qp/Qs:         1.41 Mihai Croitoru MD Electronically signed by Sanda Klein MD Signature Date/Time: 11/05/2020/11:57:24 AM    Final     Cardiac Studies   Bidirectional shunting noted (subconstal Color)  Patient Profile     60 y.o. 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 Concern for ASD with associated RV dysfunction - without frank RV failure; if having a secundum defect would be appropriate for RHC and consideration of closure if other comorbidity could be safely addressed - planned for TEE (see below)  Typical Atrial Flutter - planned for TEE/DCCV awaiting schedule on 3/28; if unable to add will be on 3/29 schedule instead - continue new start DOAC - rate controlled on bisoprolol) - After careful review of history and examination, the risks and benefits of transesophageal echocardiogram have been explained including risks of esophageal damage, perforation (1:10,000 risk), bleeding, pharyngeal hematoma as well as other potential complications associated with conscious sedation including aspiration, arrhythmia, respiratory failure, stroke and death. Alternatives to treatment were discussed, questions were answered. Patient is willing to proceed.   COPD Tobacco Abuse - has Nicotine Patch - restarting his home albuterol PRN SOB - weaned O2 to 3 L through course  Skin Rash - will start nystatin cream  Alcohol abuse with prior withdrawal symptoms Tranaminitis Constipation Umbilical hernia with prior repair Hyperammonemia without asterixis Malnutrition with albumin 2.9 - NPO except medications for now - starting lactulose - BNP for 11/07/20 - low dose IV lasix ordered for 3/29 (20 IV- his home regimen is 40 mg PO) - may add spironolactone through course - lactulose started X1; may end up starting as long term medication -  Continue CIWA - will follow CMP, CBC with new DOAC  AKI vs new baseline creatinine - will follow  Low threshold to consult [facility] for significant non-cardiac sequalae  DVT Proph- DOAC Labs ordered Full Code Diet- NPO- > cardiac with protein supplements once procedure is finished Activity:  Still fall risk;  discussed with nursing about UOB to chair when low CIWA  CRITICAL CARE Performed by: Mahesh A Chandrasekhar  Total critical care time: 60 minutes. Critical care time was exclusive of separately billable procedures and treating other patients. Critical care was necessary to treat or prevent imminent or life-threatening deterioration. Critical care was time spent personally by me on the following activities: development of treatment plan with patient and/or surrogate as well as nursing, discussions with consultants, evaluation of patient's response to treatment, examination of patient, obtaining history from patient or surrogate, ordering and performing treatments and interventions, ordering and review of laboratory studies, ordering and review of radiographic studies, pulse oximetry and re-evaluation of patient's condition.   For questions or updates, please contact [place] Please consult [email or web address] for contact info under Cardiology/STEMI.      Signed, Mahesh  Ernst Spell, MD  11/06/2020, 8:10 AM

## 2020-11-06 NOTE — Plan of Care (Signed)
  Problem: Education: Goal: Knowledge of General Education information will improve Description: Including pain rating scale, medication(s)/side effects and non-pharmacologic comfort measures Outcome: Progressing   Problem: Health Behavior/Discharge Planning: Goal: Ability to manage health-related needs will improve Outcome: Progressing   Problem: Clinical Measurements: Goal: Ability to maintain clinical measurements within normal limits will improve Outcome: Progressing Goal: Will remain free from infection Outcome: Progressing Goal: Diagnostic test results will improve Outcome: Progressing Goal: Respiratory complications will improve Outcome: Progressing Goal: Cardiovascular complication will be avoided Outcome: Progressing   Problem: Activity: Goal: Risk for activity intolerance will decrease Outcome: Progressing   Problem: Nutrition: Goal: Adequate nutrition will be maintained Outcome: Progressing   Problem: Coping: Goal: Level of anxiety will decrease Outcome: Progressing   Problem: Elimination: Goal: Will not experience complications related to bowel motility Outcome: Progressing Goal: Will not experience complications related to urinary retention Outcome: Progressing   Problem: Pain Managment: Goal: General experience of comfort will improve Outcome: Progressing   Problem: Safety: Goal: Ability to remain free from injury will improve Outcome: Progressing   Problem: Skin Integrity: Goal: Risk for impaired skin integrity will decrease Outcome: Progressing   Problem: Activity: Goal: Capacity to carry out activities will improve Outcome: Progressing   Problem: Cardiac: Goal: Ability to achieve and maintain adequate cardiopulmonary perfusion will improve Outcome: Progressing   Problem: Activity: Goal: Ability to tolerate increased activity will improve Outcome: Progressing   Problem: Cardiac: Goal: Ability to achieve and maintain adequate  cardiopulmonary perfusion will improve Outcome: Progressing   Problem: Health Behavior/Discharge Planning: Goal: Ability to safely manage health-related needs after discharge will improve Outcome: Progressing

## 2020-11-06 NOTE — TOC Benefit Eligibility Note (Signed)
Patient Teacher, English as a foreign language completed.    The patient is currently admitted and upon discharge could be taking Eliquis 5 mg.  The current 30 day co-pay is, $513.21.   The patient is insured through [employer] [place] Patient Advocate Specialist [place] Team Direct Number: [phone]  Fax: [phone]

## 2020-11-07 ENCOUNTER — Encounter: Payer: 59 | Admitting: Nurse Practitioner

## 2020-11-07 ENCOUNTER — Inpatient Hospital Stay (HOSPITAL_COMMUNITY): Payer: 59

## 2020-11-07 DIAGNOSIS — I483 Typical atrial flutter: Secondary | ICD-10-CM | POA: Diagnosis not present

## 2020-11-07 DIAGNOSIS — I5082 Biventricular heart failure: Secondary | ICD-10-CM | POA: Diagnosis not present

## 2020-11-07 DIAGNOSIS — F1010 Alcohol abuse, uncomplicated: Secondary | ICD-10-CM | POA: Diagnosis not present

## 2020-11-07 DIAGNOSIS — Z720 Tobacco use: Secondary | ICD-10-CM | POA: Diagnosis not present

## 2020-11-07 DIAGNOSIS — S0990XA Unspecified injury of head, initial encounter: Secondary | ICD-10-CM

## 2020-11-07 LAB — CBC
HCT: 49.5 % (ref 39.0–52.0)
Hemoglobin: 15.1 g/dL (ref 13.0–17.0)
MCH: 32.3 pg (ref 26.0–34.0)
MCHC: 30.5 g/dL (ref 30.0–36.0)
MCV: 105.8 fL — ABNORMAL HIGH (ref 80.0–100.0)
Platelets: 295 10*3/uL (ref 150–400)
RBC: 4.68 MIL/uL (ref 4.22–5.81)
RDW: 13.6 % (ref 11.5–15.5)
WBC: 8.7 10*3/uL (ref 4.0–10.5)
nRBC: 0.3 % — ABNORMAL HIGH (ref 0.0–0.2)

## 2020-11-07 LAB — COMPREHENSIVE METABOLIC PANEL
ALT: 56 U/L — ABNORMAL HIGH (ref 0–44)
AST: 32 U/L (ref 15–41)
Albumin: 2.4 g/dL — ABNORMAL LOW (ref 3.5–5.0)
Alkaline Phosphatase: 92 U/L (ref 38–126)
Anion gap: 5 (ref 5–15)
BUN: 36 mg/dL — ABNORMAL HIGH (ref 6–20)
CO2: 36 mmol/L — ABNORMAL HIGH (ref 22–32)
Calcium: 8.6 mg/dL — ABNORMAL LOW (ref 8.9–10.3)
Chloride: 97 mmol/L — ABNORMAL LOW (ref 98–111)
Creatinine, Ser: 0.98 mg/dL (ref 0.61–1.24)
GFR, Estimated: >60 mL/min (ref >60)
Glucose, Bld: 105 mg/dL — ABNORMAL HIGH (ref 70–99)
Potassium: 4.4 mmol/L (ref 3.5–5.1)
Sodium: 138 mmol/L (ref 135–145)
Total Bilirubin: 0.9 mg/dL (ref 0.3–1.2)
Total Protein: 6.1 g/dL — ABNORMAL LOW (ref 6.5–8.1)

## 2020-11-07 LAB — FOLATE: Folate: 22.2 ng/mL (ref >5.9)

## 2020-11-07 LAB — HEPATITIS PANEL, ACUTE
HCV Ab: NONREACTIVE
Hep A IgM: NONREACTIVE
Hep B C IgM: NONREACTIVE
Hepatitis B Surface Ag: NONREACTIVE

## 2020-11-07 LAB — AMMONIA: Ammonia: 76 umol/L — ABNORMAL HIGH (ref 9–35)

## 2020-11-07 LAB — VITAMIN B12: Vitamin B-12: 1562 pg/mL — ABNORMAL HIGH (ref 180–914)

## 2020-11-07 LAB — BRAIN NATRIURETIC PEPTIDE: B Natriuretic Peptide: 1195.6 pg/mL — ABNORMAL HIGH (ref 0.0–100.0)

## 2020-11-07 MED ORDER — BOOST / RESOURCE BREEZE PO LIQD CUSTOM
1.0000 | Freq: Three times a day (TID) | ORAL | Status: DC
  Administered 2020-11-07 – 2020-11-14 (×18): 1 via ORAL

## 2020-11-07 MED ORDER — SENNOSIDES-DOCUSATE SODIUM 8.6-50 MG PO TABS
1.0000 | ORAL_TABLET | Freq: Every day | ORAL | Status: DC
  Administered 2020-11-07: 1 via ORAL
  Filled 2020-11-07: qty 1

## 2020-11-07 MED ORDER — ADULT MULTIVITAMIN W/MINERALS CH
1.0000 | ORAL_TABLET | Freq: Every day | ORAL | Status: DC
  Administered 2020-11-07 – 2020-11-14 (×8): 1 via ORAL
  Filled 2020-11-07 (×8): qty 1

## 2020-11-07 MED ORDER — LORAZEPAM 2 MG/ML IJ SOLN: 0.5000 mg | INTRAMUSCULAR | Status: DC | PRN

## 2020-11-07 MED ORDER — THIAMINE HCL 100 MG PO TABS
100.0000 mg | ORAL_TABLET | Freq: Every day | ORAL | Status: DC
  Administered 2020-11-07 – 2020-11-14 (×8): 100 mg via ORAL
  Filled 2020-11-07 (×8): qty 1

## 2020-11-07 MED ORDER — FUROSEMIDE 10 MG/ML IJ SOLN
40.0000 mg | Freq: Once | INTRAMUSCULAR | Status: AC
  Administered 2020-11-07: 40 mg via INTRAVENOUS
  Filled 2020-11-07: qty 4

## 2020-11-07 MED ORDER — FOLIC ACID 1 MG PO TABS
1.0000 mg | ORAL_TABLET | Freq: Every day | ORAL | Status: DC
  Administered 2020-11-07 – 2020-11-14 (×8): 1 mg via ORAL
  Filled 2020-11-07 (×8): qty 1

## 2020-11-07 NOTE — Plan of Care (Signed)
Critical Event note  Briefly- called to bedside:  Patient fall occurred.  Patient on CIWA with ativan and hit his head.  Evaluated by nursing and was on his arms and knees. Patient notes that he doesn't remember hitting his head;  Drowsy but rousable Exam notable for no focal neurologic deficits: no pupillary changes Heart rate 145 post fall Ordered Stat CT Chsest Personally reviewed relevant tests; no large obvious hematoma but read is pending -Would recommend bedrest or sitter based fall assessment until off of CIWA -Follow up CT scan head; if negative reasonable to resume AC -Low threshold to call neurology or neurosurgery as appropriate - RVR is well tolerated and reactive to fall; if not improved with recovery from this can give bisoprolol 5 mg X1 and increase daily ose to 10 mg; will need careful BP checks to add this  CRITICAL CARE Performed by: Abdishakur Gottschall A Jadalyn Oliveri  Total critical care time: 30 minutes. Critical care time was exclusive of separately billable procedures and treating other patients. Critical care was necessary to treat or prevent imminent or life-threatening deterioration. Critical care was time spent personally by me on the following activities: development of treatment plan with patient and/or surrogate as well as nursing, discussions with consultants, evaluation of patient's response to treatment, examination of patient, obtaining history from patient or surrogate, ordering and performing treatments and interventions, ordering and review of laboratory studies, ordering and review of radiographic studies, pulse oximetry and re-evaluation of patient's condition.    Signed, Rudean Haskell, [place]  11/07/2020 12:24 PM

## 2020-11-07 NOTE — Progress Notes (Signed)
Progress Note  Patient Name: Jordan Kelly Date of Encounter: 11/07/2020  Primary Cardiologist: No primary care provider on file.   Subjective   Felt fine overnight.  No SOB but unable to wean oxygen.  No change in rash.  Has needed more ativan to stay comfortable  Inpatient Medications    Scheduled Meds: . apixaban  5 mg Oral BID  . bisoprolol  5 mg Oral Daily  . Chlorhexidine Gluconate Cloth  6 each Topical Q0600  . folic acid  1 mg Oral Daily  . hydrocortisone cream  1 application Topical BID  . influenza vac split quadrivalent PF  0.5 mL Intramuscular Tomorrow-1000  . LORazepam  0-4 mg Oral Q6H   Followed by  . [START ON 11/08/2020] LORazepam  0-4 mg Oral Q12H  . mouth rinse  15 mL Mouth Rinse BID  . multivitamin with minerals  1 tablet Oral Daily  . nicotine  21 mg Transdermal Daily  . nystatin cream   Topical BID  . pneumococcal 23 valent vaccine  0.5 mL Intramuscular Tomorrow-1000  . senna-docusate  1 tablet Oral QHS  . thiamine  100 mg Oral Daily   Continuous Infusions: . sodium chloride Stopped (11/06/20 1415)   PRN Meds: acetaminophen, albuterol, LORazepam, magnesium hydroxide, ondansetron (ZOFRAN) IV   Vital Signs    Vitals:   11/07/20 0700 11/07/20 0800 11/07/20 0900 11/07/20 1000  BP: 103/74 108/76 122/88 111/79  Pulse: (!) 49 (!) 50 (!) 49 (!) 51  Resp: 17 18 (!) 23 19  Temp:  98 F (36.7 C)    TempSrc:  Oral    SpO2: 96% (!) 88% (!) 86% 97%  Weight:      Height:        Intake/Output Summary (Last 24 hours) at 11/07/2020 1022 Last data filed at 11/07/2020 0435 Gross per 24 hour  Intake 1239.88 ml  Output 1250 ml  Net -10.12 ml   Filed Weights   11/04/20 1437 11/04/20 2330 11/06/20 0550  Weight: 87.1 kg 83.7 kg 83.5 kg    Telemetry    Atrial Flutter predominantely - Personally Reviewed  ECG    No new - Personally Reviewed  Physical Exam   GEN: No acute distress.   Neck: No JVD Cardiac: Irregularly irregular rhythm, no murmurs,  rubs, or gallops.  Respiratory: Expiratory wheezes with no crackles; good air movement GI: Soft, with slight non-tender distention, umbilical hernia noted MS: +2 edema; No deformity. Neuro:  Nonfocal, no asterixis Psych: Normal affect  Skin:  Red macular rash in intertriginous zone- stable  Labs    Chemistry Recent Labs  Lab 11/04/20 1514 11/04/20 1540 11/05/20 1041 11/06/20 0046 11/07/20 0042  NA 137   < > 137 136 138  K 4.3   < > 4.5 4.5 4.4  CL 97*   < > 96* 97* 97*  CO2 30  --  33* 34* 36*  GLUCOSE 111*   < > 133* 121* 105*  BUN 46*   < > 36* 40* 36*  CREATININE 1.28*   < > 1.01 1.26* 0.98  CALCIUM 8.5*  --  8.5* 8.4* 8.6*  PROT 7.0  --   --   --  6.1*  ALBUMIN 2.9*  --   --   --  2.4*  AST 70*  --   --   --  32  ALT 99*  --   --   --  56*  ALKPHOS 108  --   --   --  92  BILITOT 0.9  --   --   --  0.9  GFRNONAA >60  --  >60 >60 >60  ANIONGAP 10  --  8 5 5    < > = values in this interval not displayed.     Hematology Recent Labs  Lab 11/04/20 1514 11/04/20 1540 11/05/20 1041 11/07/20 0042  WBC 8.5  --  9.3 8.7  RBC 4.66  --  4.77 4.68  HGB 15.3 16.7 15.7 15.1  HCT 49.2 49.0 50.0 49.5  MCV 105.6*  --  104.8* 105.8*  MCH 32.8  --  32.9 32.3  MCHC 31.1  --  31.4 30.5  RDW 13.4  --  13.5 13.6  PLT 306  --  276 295    Cardiac EnzymesNo results for input(s): TROPONINI in the last 168 hours. No results for input(s): TROPIPOC in the last 168 hours.   BNP Recent Labs  Lab 11/04/20 1514 11/07/20 0042  BNP 1,118.0* 1,195.6*     DDimer No results for input(s): DDIMER in the last 168 hours.   Radiology    ECHOCARDIOGRAM COMPLETE BUBBLE STUDY  Result Date: 11/05/2020    ECHOCARDIOGRAM REPORT   Patient Name:   Jordan Kelly Date of Exam: 11/05/2020 Medical Rec #:  [id number]     Height:       70.0 in Accession #:    [id number]    Weight:       184.5 lb Date of Birth:  22-Dec-1960      BSA:          2.017 m Patient Age:    60 years      BP:           105/80 mmHg  Patient Gender: M             HR:           76 bpm. Exam Location:  Inpatient Procedure: 2D Echo, Cardiac Doppler, Color Doppler, 3D Echo and Saline Contrast            Bubble Study Indications:    Atrial Flutter I48.92  History:        Patient has no prior history of Echocardiogram examinations.                 COPD; Risk Factors:Current Smoker.  Sonographer:    Darlina Sicilian RDCS Referring Phys: [id number] MIHAI CROITORU  Sonographer Comments: Global longitudinal strain was attempted. IMPRESSIONS  1. Left ventricular ejection fraction, by estimation, is 45 to 50%. The left ventricle has mildly decreased function. The left ventricle demonstrates global hypokinesis. Left ventricular diastolic function could not be evaluated. There is the interventricular septum is flattened in diastole ('D' shaped left ventricle), consistent with right ventricular volume overload. The average left ventricular global longitudinal strain is -12.0 %. The global longitudinal strain is abnormal.  2. Right ventricular systolic function is moderately reduced. The right ventricular size is moderately enlarged. There is mildly elevated pulmonary artery systolic pressure.  3. Right atrial size was severely dilated.  4. The mitral valve is normal in structure. No evidence of mitral valve regurgitation.  5. The aortic valve is tricuspid. Aortic valve regurgitation is not visualized. No aortic stenosis is present.  6. The inferior vena cava is dilated in size with <50% respiratory variability, suggesting right atrial pressure of 15 mmHg.  7. Evidence of atrial level shunting detected by color flow Doppler. 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 Comparison(s): No prior Echocardiogram. Conclusion(s)/Recommendation(s): Transesophageal echocardiogram to assess the atrial septal defect. FINDINGS  Left Ventricle: Left ventricular ejection fraction, by estimation, is 45 to 50%. The left ventricle  has mildly decreased function. The left ventricle demonstrates global hypokinesis. The average left ventricular global longitudinal strain is -12.0 %. The global longitudinal strain is abnormal. The left ventricular internal cavity size was normal in size. There is no left ventricular hypertrophy. The interventricular septum is flattened in diastole ('D' shaped left ventricle), consistent with right ventricular volume overload. Left ventricular diastolic function could not be evaluated due to atrial fibrillation. Left ventricular diastolic function could not be evaluated. Right Ventricle: The right ventricular size is moderately enlarged. No increase in right ventricular wall thickness. Right ventricular systolic function is moderately reduced. There is mildly elevated pulmonary artery systolic pressure. The tricuspid regurgitant velocity is 2.58 m/s, and with an assumed right atrial pressure of 15 mmHg, the estimated right ventricular systolic pressure is 58.5 mmHg. Left Atrium: Left atrial size was normal in size. Right Atrium: Right atrial size was severely dilated. Pericardium: There is no evidence of pericardial effusion. Mitral Valve: The mitral valve is normal in structure. No evidence of mitral valve regurgitation. Tricuspid Valve: The tricuspid valve is normal in structure. Tricuspid valve regurgitation is mild. Aortic Valve: The aortic valve is tricuspid. Aortic valve regurgitation is not visualized. No aortic stenosis is present. Pulmonic Valve: The pulmonic valve was normal in structure. Pulmonic valve regurgitation is trivial. Aorta: The aortic root and ascending aorta are structurally normal, with no evidence of dilitation. Venous: The inferior vena cava is dilated in size with less than 50% respiratory variability, suggesting right atrial pressure of 15 mmHg. IAS/Shunts: Evidence of atrial level shunting detected by color flow Doppler. Agitated saline contrast was given intravenously to evaluate for  intracardiac shunting. There is a small secundum atrial septal defect with bidirectional shunting across the atrial septum.  LEFT VENTRICLE PLAX 2D LVIDd:         5.30 cm LVIDs:         4.40 cm  2D Longitudinal Strain LV PW:         0.70 cm  2D Strain GLS Avg:     -12.0 % LV IVS:        0.70 cm LVOT diam:     2.00 cm LV SV:         47 LV SV Index:   24 LVOT Area:     3.14 cm  RIGHT VENTRICLE RV S prime:     7.40 cm/s RVOT diam:      2.54 cm TAPSE (M-mode): 1.1 cm LEFT ATRIUM             Index       RIGHT ATRIUM           Index LA diam:        3.60 cm 1.78 cm/m  RA Area:     28.56 cm LA Vol (A2C):   38.1 ml 18.89 ml/m RA Volume:   52.60 ml  26.08 ml/m LA Vol (A4C):   46.4 ml 23.00 ml/m LA Biplane Vol: 43.3 ml 21.47 ml/m  AORTIC VALVE             PULMONIC VALVE LVOT Vmax:   97.90 cm/s  RVOT Peak grad: 3 mmHg LVOT Vmean:  67.000 cm/s LVOT VTI:    0.151 m  AORTA Ao Root diam: 3.70 cm Ao Asc diam:  3.20 cm TRICUSPID VALVE TV  VTI:         0.19 msec TR Peak grad:   26.6 mmHg TR Vmax:        258.00 cm/s  SHUNTS Systemic VTI:  0.15 m Systemic Diam: 2.00 cm Pulmonic VTI:  0.132 m Pulmonic Diam: 2.54 cm Qp/Qs:         1.41 Mihai Croitoru MD Electronically signed by Sanda Klein MD Signature Date/Time: 11/05/2020/11:57:24 AM    Final     Cardiac Studies   Bidirectional shunting noted (subconstal Color)  Patient Profile     60 y.o. 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 Concern for ASD with associated RV dysfunction - without frank RV failure; if having a secundum defect would be appropriate for RHC and consideration of closure if other comorbidity could be safely addressed - planned for TEE (see below) - starting IV lasix today  Typical Atrial Flutter - planned for TEE/DCCV awaiting schedule on 3/29;  - continue new start DOAC -  rate controlled on bisoprolol with slight increase in heart rates  COPD Tobacco Abuse - has Nicotine Patch - restarting his home albuterol PRN SOB - [facility] assisting; if we cannot wean O2 with diuresis will attempt steroid burst  Skin Rash - will start nystatin cream  Alcohol abuse with prior withdrawal symptoms Tranaminitis Constipation Umbilical hernia with prior repair Hyperammonemia without asterixis Malnutrition with albumin 2.9 - NPO at midnight - starting Sennaok; appreciate [facility] recs - lasix as above - may add spironolactone through course -  Continue CIWA - stable Hgb and improved LFTs  AKI vs new baseline creatinine - stable to improving   DVT Proph- DOAC Labs ordered Full Code Diet- NPO at midnight > cardiac with protein supplements once procedure is finished   CRITICAL CARE Performed by: Yug Loria A Ellesse Antenucci  Total critical care time: 35 minutes. Critical care time was exclusive of separately billable procedures and treating other patients. Critical care was necessary to treat or prevent imminent or life-threatening deterioration. Critical care was time spent personally by me on the following activities: development of treatment plan with patient and/or surrogate as well as nursing, discussions with consultants, evaluation of patient's response to treatment, examination of patient, obtaining history from patient or surrogate, ordering and performing treatments and interventions, ordering and review of laboratory studies, ordering and review of radiographic studies, pulse oximetry and re-evaluation of patient's condition.   For questions or updates, please contact [place] Please consult [email or web address] for contact info under Cardiology/STEMI.      Signed, Werner Lean, MD  11/07/2020, 10:22 AM

## 2020-11-07 NOTE — Progress Notes (Signed)
1105- Pt attempted to get out of chair to use urinal without RN present.  Pt had an unwitnessed fall, verbalized to RN that he fell onto his knees and then hit his head when attempting to get off the floor.  Patient placed back in bed with 2 RN assist.  Pupils reactive to light, no change from 8 am assessment.  Pt alert and oriented x 4, no vomiting, no LOC.  Scant bleeding from abrasion to nose and forehead.   Dr. Gasper Sells and Dr. Darrell Jewel notified via phone.  1120-DrGasper Sells at bedside for assessment.  Orders received for stat head CT.  Pt remains A&Ox4.  5701- Patient taken to CT with RN and transport via bed, monitor and oxygen.    83- Spoke with daughter Vikki Ports and updated on pt's fall and status.  1205- Patient back from head CT.  1230- Daughter updated on head CT results.

## 2020-11-07 NOTE — Social Work (Signed)
CSW received consult for substance use resources. Patient was offered resources for outpatient substance use treatment services. Patient accepted.  CSW will continue to follow.

## 2020-11-07 NOTE — Progress Notes (Signed)
Consult Progress Note    Jordan Kelly   Jordan Kelly  DOB: 03/01/1961  DOA: 11/04/2020     3  PCP: Noreene Larsson, NP  Reason for consult: rash, history of alcohol use  Hospital Course: Jordan Kelly is a 60 y.o. male with PMH tobacco and alcohol abuse who initially presented with complaints of constipation, fatigue, and edema on 3/26.  Patient was initially found to be in atrial flutter.  Labs were significant for BUN 46, creatinine 1.28, AST 70, ALT 99, and BNP 1119.   Patient was initially started on Cardizem drip, but was transitioned to bisoprolol and tolerated well.  He has a history of drinking approx 6-pack beer daily. 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  He appears disheveled/unkempt when evaluated.  There were reports of patient having a pruritic rash notably on his right thigh and also a more pronounced rash in his groin region.  Rash was noted to have worsened recently with heat exposure. He also has been constipated and was given a dose of lactulose with good response.  Interval History:  No events overnight. Sitting up in chair asleep this am. Very lethargic but was able to arouse with verbal and some tactile stimulation. Did follow commands and mostly had 1 worded answers. Had received ativan this am for CIWA.   Of note, after patient was seen this morning, he had a fall out of his chair to the floor and landed on his knees and head. He was sent for a CT head which was negative for bleeding.  ROS: Constitutional: negative for chills and fevers, Respiratory: negative for cough, Cardiovascular: negative and Gastrointestinal: negative for abdominal pain  Assessment & Plan:  Atrial flutter -Mostly rate controlled. ?bradycardia vs monitor not accounting for rhythm.  Patient tachycardic on my exam -Continue bisoprolol -Continue Eliquis.  CT head negative after fall -Cardiology managing.  Plan is for TEE/DCCV tentatively on Wednesday  HFrEF - Patient  still with some lower extremity edema.  BNP was initially 1119 echocardiogram revealed EF of 45 to 50%.  - Currently being diuresed with IV Lasix. - management per cardiology/primary team  Contact dermatitis Cutaneous candidiasis - likely heat related/poor hygeine rash noted on thighs and groin; agree likely combo of candidiasis and contact dermatitis from sweaty/dirty clothes -Continue nystatin cream to groin region -Continue hydrocortisone cream to thighs  Ongoing alcohol abuse Transaminitis -Continue on CIWA.  Scores are downtrending.  Will decrease Ativan especially with oversedation noted today -Start thiamine, folic acid, multivitamins -Check hepatitis panel -Obtain right upper quadrant ultrasound given abdominal distention -Ammonia level noted to be borderline upper limit of normal on admission -Repeat ammonia level now.  May need further lactulose.  No obvious asterixis noted but patient lethargic  COPD Tobacco Use - hold off on steroids, especially with RVR - continue nicotine patch and breathing treatments   Acute kidney injury - baseline creatinine ~ 0.8 - patient presents with increase in creat >0.3 mg/dL above baseline - probable CRS vs arrhythmia mediated -Renal function responding to diuresis -BMP daily  Constipation -Continue Senokot-S -Follow-up repeat ammonia.  May need further lactulos   Old records reviewed in assessment of this patient  Antimicrobials: n/a  DVT prophylaxis:  apixaban (ELIQUIS) tablet 5 mg   Code Status:   Code Status: Full Code  Objective: Blood pressure 111/79, pulse (!) 51, temperature 98 F (36.7 C), temperature source Oral, resp. rate 19, height 5\' 10"  (1.778 m), weight 83.5 kg, SpO2 97 %.  Examination: General appearance: Lethargic, difficult to arouse but ultimately does with tactile and verbal stimuli.  Falls asleep easily Head: Normocephalic, without obvious abnormality, atraumatic Eyes: Right pupil approximately 2  mm.  Left pupil approximately 3 mm.  Both pupils reactive to light Lungs: clear to auscultation bilaterally Heart: irregularly irregular rhythm, S1, S2 normal and Tachycardic Abdomen: Distended.  Nontender.  Bowel sounds distant but present Extremities: Lower extremity edema noted Skin: mobility and turgor normal Neurologic: Moves all 4 extremities.  Follows commands  Data Reviewed: I have personally reviewed following labs and imaging studies Results for orders placed or performed during the hospital encounter of 11/04/20 (from the past 24 hour(s))  Brain natriuretic peptide     Status: Abnormal   Collection Time: 11/07/20 12:42 AM  Result Value Ref Range   B Natriuretic Peptide 1,195.6 (H) 0.0 - 100.0 pg/mL  CBC     Status: Abnormal   Collection Time: 11/07/20 12:42 AM  Result Value Ref Range   WBC 8.7 4.0 - 10.5 K/uL   RBC 4.68 4.22 - 5.81 MIL/uL   Hemoglobin 15.1 13.0 - 17.0 g/dL   HCT 49.5 39.0 - 52.0 %   MCV 105.8 (H) 80.0 - 100.0 fL   MCH 32.3 26.0 - 34.0 pg   MCHC 30.5 30.0 - 36.0 g/dL   RDW 13.6 11.5 - 15.5 %   Platelets 295 150 - 400 K/uL   nRBC 0.3 (H) 0.0 - 0.2 %  Comprehensive metabolic panel     Status: Abnormal   Collection Time: 11/07/20 12:42 AM  Result Value Ref Range   Sodium 138 135 - 145 mmol/L   Potassium 4.4 3.5 - 5.1 mmol/L   Chloride 97 (L) 98 - 111 mmol/L   CO2 36 (H) 22 - 32 mmol/L   Glucose, Bld 105 (H) 70 - 99 mg/dL   BUN 36 (H) 6 - 20 mg/dL   Creatinine, Ser 0.98 0.61 - 1.24 mg/dL   Calcium 8.6 (L) 8.9 - 10.3 mg/dL   Total Protein 6.1 (L) 6.5 - 8.1 g/dL   Albumin 2.4 (L) 3.5 - 5.0 g/dL   AST 32 15 - 41 U/L   ALT 56 (H) 0 - 44 U/L   Alkaline Phosphatase 92 38 - 126 U/L   Total Bilirubin 0.9 0.3 - 1.2 mg/dL   GFR, Estimated >60 >60 mL/min   Anion gap 5 5 - 15  Folate     Status: None   Collection Time: 11/07/20  8:16 AM  Result Value Ref Range   Folate 22.2 >5.9 ng/mL  Vitamin B12     Status: Abnormal   Collection Time: 11/07/20  8:16 AM   Result Value Ref Range   Vitamin B-12 1,562 (H) 180 - 914 pg/mL    Recent Results (from the past 240 hour(s))  SARS CORONAVIRUS 2 (TAT 6-24 HRS) Nasopharyngeal Nasopharyngeal Swab     Status: None   Collection Time: 11/04/20  3:01 PM   Specimen: Nasopharyngeal Swab  Result Value Ref Range Status   SARS Coronavirus 2 NEGATIVE NEGATIVE Final    Comment: (NOTE) SARS-CoV-2 target nucleic acids are NOT DETECTED.  The SARS-CoV-2 RNA is generally detectable in upper and lower respiratory specimens during the acute phase of infection. Negative results do not preclude SARS-CoV-2 infection, do not rule out co-infections with other pathogens, and should not be used as the sole basis for treatment or other patient management decisions. Negative results must be combined with clinical observations, patient history, and epidemiological information.  The expected result is Negative.  Fact Sheet for Patients: SugarRoll.be  Fact Sheet for Healthcare Providers: https://www.woods-mathews.com/  This test is not yet approved or cleared by the [place] FDA and  has been authorized for detection and/or diagnosis of SARS-CoV-2 by FDA under an Emergency Use Authorization (EUA). This EUA will remain  in effect (meaning this test can be used) for the duration of the COVID-19 declaration under Se ction 564(b)(1) of the Act, 21 U.S.C. section 360bbb-3(b)(1), unless the authorization is terminated or revoked sooner.  Performed at [facility], [place] [address] [place], [place] [address]   Blood culture (routine x 2)     Status: None (Preliminary result)   Collection Time: 11/04/20  3:44 PM   Specimen: Left Antecubital; Blood  Result Value Ref Range Status   Specimen Description   Final    LEFT ANTECUBITAL BOTTLES DRAWN AEROBIC AND ANAEROBIC   Special Requests Blood Culture adequate volume  Final   Culture   Final    NO GROWTH 3 DAYS Performed at  [facility], [address] [place], [place] [address]    Report Status PENDING  Incomplete  Blood culture (routine x 2)     Status: None (Preliminary result)   Collection Time: 11/04/20  3:46 PM   Specimen: BLOOD RIGHT ARM  Result Value Ref Range Status   Specimen Description   Final    BLOOD RIGHT ARM BOTTLES DRAWN AEROBIC AND ANAEROBIC   Special Requests Blood Culture adequate volume  Final   Culture   Final    NO GROWTH 3 DAYS Performed at [facility], [address], [place], [place] [address]    Report Status PENDING  Incomplete  MRSA PCR Screening     Status: None   Collection Time: 11/04/20 10:55 PM   Specimen: Nasopharyngeal  Result Value Ref Range Status   MRSA by PCR NEGATIVE NEGATIVE Final    Comment:        The GeneXpert MRSA Assay (FDA approved for NASAL specimens only), is one component of a comprehensive MRSA colonization surveillance program. It is not intended to diagnose MRSA infection nor to guide or monitor treatment for MRSA infections. Performed at [facility], [place] [address], [place], [place] [address]      Radiology Studies: CT HEAD WO CONTRAST  Result Date: 11/07/2020 CLINICAL DATA:  Fall with head trauma.  On Eliquis. EXAM: CT HEAD WITHOUT CONTRAST TECHNIQUE: Contiguous axial images were obtained from the base of the skull through the vertex without intravenous contrast. COMPARISON:  None. FINDINGS: The study is mildly motion degraded despite repeat imaging. Brain: There is no evidence of an acute infarct, intracranial hemorrhage, mass, midline shift, or extra-axial fluid collection. The ventricles and sulci are within normal limits for age. Vascular: No hyperdense vessel. Skull: No fracture or suspicious osseous lesion. Sinuses/Orbits: Visualized paranasal sinuses are clear. Small bilateral mastoid effusions. Unremarkable orbits. Other: Possible mild left frontal scalp soft tissue swelling. IMPRESSION: No evidence of acute intracranial  abnormality. Electronically Signed   By: Logan Bores M.D.   On: 11/07/2020 12:34   CT HEAD WO CONTRAST  Final Result    DG Chest Port 1 View  Final Result    US Abdomen Limited    (Results Pending)    Scheduled Meds: . apixaban  5 mg Oral BID  . bisoprolol  5 mg Oral Daily  . Chlorhexidine Gluconate Cloth  6 each Topical Q0600  . feeding supplement  1 Container  Oral TID BM  . folic acid  1 mg Oral Daily  . hydrocortisone cream  1 application Topical BID  . influenza vac split quadrivalent PF  0.5 mL Intramuscular Tomorrow-1000  . LORazepam  0-4 mg Oral Q6H  . mouth rinse  15 mL Mouth Rinse BID  . multivitamin with minerals  1 tablet Oral Daily  . nicotine  21 mg Transdermal Daily  . nystatin cream   Topical BID  . pneumococcal 23 valent vaccine  0.5 mL Intramuscular Tomorrow-1000  . senna-docusate  1 tablet Oral QHS  . thiamine  100 mg Oral Daily   PRN Meds: acetaminophen, albuterol, LORazepam, magnesium hydroxide, ondansetron (ZOFRAN) IV Continuous Infusions: . sodium chloride Stopped (11/06/20 1415)     LOS: 3 days  Time spent: Greater than 50% of the 35 minute visit was spent in counseling/coordination of care for the patient as laid out in the A&P.   Dwyane Dee, MD [facility] 11/07/2020, 1:48 PM

## 2020-11-08 ENCOUNTER — Encounter (HOSPITAL_COMMUNITY): Payer: Self-pay | Admitting: Cardiology

## 2020-11-08 ENCOUNTER — Encounter (HOSPITAL_COMMUNITY)
Admission: EM | Disposition: A | Discharge status: Home / Self Care | Payer: Self-pay | Source: Home / Self Care | Attending: Internal Medicine

## 2020-11-08 ENCOUNTER — Inpatient Hospital Stay (HOSPITAL_COMMUNITY): Payer: 59

## 2020-11-08 ENCOUNTER — Inpatient Hospital Stay (HOSPITAL_COMMUNITY): Payer: 59 | Admitting: Certified Registered"

## 2020-11-08 DIAGNOSIS — I50810 Right heart failure, unspecified: Secondary | ICD-10-CM

## 2020-11-08 DIAGNOSIS — Q211 Atrial septal defect: Secondary | ICD-10-CM | POA: Diagnosis not present

## 2020-11-08 DIAGNOSIS — I483 Typical atrial flutter: Secondary | ICD-10-CM | POA: Diagnosis not present

## 2020-11-08 DIAGNOSIS — K7030 Alcoholic cirrhosis of liver without ascites: Secondary | ICD-10-CM

## 2020-11-08 DIAGNOSIS — I4892 Unspecified atrial flutter: Secondary | ICD-10-CM | POA: Diagnosis not present

## 2020-11-08 DIAGNOSIS — J449 Chronic obstructive pulmonary disease, unspecified: Secondary | ICD-10-CM | POA: Diagnosis not present

## 2020-11-08 HISTORY — PX: CARDIOVERSION: SHX1299

## 2020-11-08 HISTORY — PX: TEE WITHOUT CARDIOVERSION: SHX5443

## 2020-11-08 HISTORY — PX: BUBBLE STUDY: SHX6837

## 2020-11-08 LAB — COMPREHENSIVE METABOLIC PANEL
ALT: 47 U/L — ABNORMAL HIGH (ref 0–44)
AST: 34 U/L (ref 15–41)
Albumin: 2.5 g/dL — ABNORMAL LOW (ref 3.5–5.0)
Alkaline Phosphatase: 93 U/L (ref 38–126)
Anion gap: 9 (ref 5–15)
BUN: 28 mg/dL — ABNORMAL HIGH (ref 6–20)
CO2: 33 mmol/L — ABNORMAL HIGH (ref 22–32)
Calcium: 8.9 mg/dL (ref 8.9–10.3)
Chloride: 97 mmol/L — ABNORMAL LOW (ref 98–111)
Creatinine, Ser: 0.83 mg/dL (ref 0.61–1.24)
GFR, Estimated: >60 mL/min (ref >60)
Glucose, Bld: 100 mg/dL — ABNORMAL HIGH (ref 70–99)
Potassium: 4.4 mmol/L (ref 3.5–5.1)
Sodium: 139 mmol/L (ref 135–145)
Total Bilirubin: 0.8 mg/dL (ref 0.3–1.2)
Total Protein: 6.2 g/dL — ABNORMAL LOW (ref 6.5–8.1)

## 2020-11-08 LAB — BRAIN NATRIURETIC PEPTIDE: B Natriuretic Peptide: 1061 pg/mL — ABNORMAL HIGH (ref 0.0–100.0)

## 2020-11-08 SURGERY — ECHOCARDIOGRAM, TRANSESOPHAGEAL
Anesthesia: Monitor Anesthesia Care

## 2020-11-08 MED ORDER — LORAZEPAM 2 MG/ML IJ SOLN: 0.5000 mg | INTRAMUSCULAR | Status: DC | PRN

## 2020-11-08 MED ORDER — LIDOCAINE 2% (20 MG/ML) 5 ML SYRINGE
INTRAMUSCULAR | Status: DC | PRN
  Administered 2020-11-08: 60 mg via INTRAVENOUS

## 2020-11-08 MED ORDER — LACTULOSE 10 GM/15ML PO SOLN
20.0000 g | Freq: Two times a day (BID) | ORAL | Status: DC
  Administered 2020-11-08 – 2020-11-13 (×10): 20 g via ORAL
  Filled 2020-11-08 (×12): qty 30

## 2020-11-08 MED ORDER — PROPOFOL 10 MG/ML IV BOLUS
INTRAVENOUS | Status: DC | PRN
  Administered 2020-11-08: 20 mg via INTRAVENOUS

## 2020-11-08 MED ORDER — SODIUM CHLORIDE 0.9 % IV SOLN: INTRAVENOUS | Status: DC | PRN

## 2020-11-08 MED ORDER — PROPOFOL 500 MG/50ML IV EMUL
INTRAVENOUS | Status: DC | PRN
  Administered 2020-11-08: 115 ug/kg/min via INTRAVENOUS

## 2020-11-08 MED ORDER — FUROSEMIDE 10 MG/ML IJ SOLN
60.0000 mg | Freq: Every day | INTRAMUSCULAR | Status: DC
  Administered 2020-11-08: 60 mg via INTRAVENOUS
  Filled 2020-11-08: qty 6

## 2020-11-08 MED ORDER — DEXMEDETOMIDINE (PRECEDEX) IN NS 20 MCG/5ML (4 MCG/ML) IV SYRINGE
PREFILLED_SYRINGE | INTRAVENOUS | Status: DC | PRN
  Administered 2020-11-08: 4 ug via INTRAVENOUS

## 2020-11-08 NOTE — Anesthesia Procedure Notes (Signed)
Procedure Name: MAC Date/Time: 11/08/2020 11:55 AM Performed by: Lavell Luster, CRNA Pre-anesthesia Checklist: Patient identified, Emergency Drugs available, Suction available, Patient being monitored and Timeout performed Patient Re-evaluated:Patient Re-evaluated prior to induction Oxygen Delivery Method: Nasal cannula Preoxygenation: Pre-oxygenation with 100% oxygen Induction Type: IV induction Placement Confirmation: positive ETCO2 and CO2 detector Dental Injury: Teeth and Oropharynx as per pre-operative assessment

## 2020-11-08 NOTE — Plan of Care (Signed)
  Problem: Education: Goal: Knowledge of General Education information will improve Description: Including pain rating scale, medication(s)/side effects and non-pharmacologic comfort measures Outcome: Progressing   Problem: Health Behavior/Discharge Planning: Goal: Ability to manage health-related needs will improve Outcome: Progressing   Problem: Clinical Measurements: Goal: Ability to maintain clinical measurements within normal limits will improve Outcome: Progressing Goal: Will remain free from infection Outcome: Progressing Goal: Diagnostic test results will improve Outcome: Progressing Goal: Respiratory complications will improve Outcome: Progressing Goal: Cardiovascular complication will be avoided Outcome: Progressing   Problem: Activity: Goal: Risk for activity intolerance will decrease Outcome: Progressing   Problem: Nutrition: Goal: Adequate nutrition will be maintained Outcome: Progressing   Problem: Coping: Goal: Level of anxiety will decrease Outcome: Progressing   Problem: Elimination: Goal: Will not experience complications related to bowel motility Outcome: Progressing Goal: Will not experience complications related to urinary retention Outcome: Progressing   Problem: Pain Managment: Goal: General experience of comfort will improve Outcome: Progressing   Problem: Safety: Goal: Ability to remain free from injury will improve Outcome: Progressing   Problem: Skin Integrity: Goal: Risk for impaired skin integrity will decrease Outcome: Progressing   Problem: Education: Goal: Ability to demonstrate management of disease process will improve Outcome: Progressing Goal: Ability to verbalize understanding of medication therapies will improve Outcome: Progressing Goal: Individualized Educational Video(s) Outcome: Progressing   Problem: Activity: Goal: Capacity to carry out activities will improve Outcome: Progressing   Problem: Cardiac: Goal:  Ability to achieve and maintain adequate cardiopulmonary perfusion will improve Outcome: Progressing   Problem: Education: Goal: Knowledge of disease or condition will improve Outcome: Progressing Goal: Understanding of medication regimen will improve Outcome: Progressing Goal: Individualized Educational Video(s) Outcome: Progressing   Problem: Activity: Goal: Ability to tolerate increased activity will improve Outcome: Progressing   Problem: Cardiac: Goal: Ability to achieve and maintain adequate cardiopulmonary perfusion will improve Outcome: Progressing   Problem: Health Behavior/Discharge Planning: Goal: Ability to safely manage health-related needs after discharge will improve Outcome: Progressing   

## 2020-11-08 NOTE — Anesthesia Postprocedure Evaluation (Signed)
Anesthesia Post Note  Patient: Jordan Kelly  Procedure(s) Performed: TRANSESOPHAGEAL ECHOCARDIOGRAM (TEE) (N/A ) CARDIOVERSION (N/A ) BUBBLE STUDY     Patient location during evaluation: Endoscopy Anesthesia Type: General Level of consciousness: awake and alert Pain management: pain level controlled Vital Signs Assessment: post-procedure vital signs reviewed and stable Respiratory status: spontaneous breathing, nonlabored ventilation, respiratory function stable and patient connected to nasal cannula oxygen Cardiovascular status: blood pressure returned to baseline and stable Postop Assessment: no apparent nausea or vomiting Anesthetic complications: no   No complications documented.  Last Vitals:  Vitals:   11/08/20 1546 11/08/20 1600  BP:    Pulse:  82  Resp:  14  Temp: 36.7 C   SpO2:  91%    Last Pain:  Vitals:   11/08/20 1600  TempSrc:   PainSc: 0-No pain                 Belenda Cruise P Si Jachim

## 2020-11-08 NOTE — Interval H&P Note (Signed)
History and Physical Interval Note:  11/08/2020 11:38 AM  Jordan Kelly  has presented today for surgery, with the diagnosis of AFIB.  The various methods of treatment have been discussed with the patient and family. After consideration of risks, benefits and other options for treatment, the patient has consented to  Procedure(s): TRANSESOPHAGEAL ECHOCARDIOGRAM (TEE) (N/A) CARDIOVERSION (N/A) as a surgical intervention.  The patient's history has been reviewed, patient examined, no change in status, stable for surgery.  I have reviewed the patient's chart and labs.  Questions were answered to the patient's satisfaction.     [employer]

## 2020-11-08 NOTE — Anesthesia Preprocedure Evaluation (Signed)
Anesthesia Evaluation  Patient identified by MRN, date of birth, ID band Patient awake    Reviewed: Allergy & Precautions, NPO status , Patient's Chart, lab work & pertinent test results  Airway Mallampati: II  TM Distance: >3 FB Neck ROM: Full    Dental  (+) Teeth Intact   Pulmonary COPD,  COPD inhaler, Current Smoker and Patient abstained from smoking.,    Pulmonary exam normal        Cardiovascular +CHF   Rhythm:Regular Rate:Normal     Neuro/Psych Depression    GI/Hepatic negative GI ROS, (+)     substance abuse  alcohol use and marijuana use,   Endo/Other  negative endocrine ROS  Renal/GU negative Renal ROS  negative genitourinary   Musculoskeletal negative musculoskeletal ROS (+)   Abdominal (+)  Abdomen: soft. Bowel sounds: normal.  Peds  Hematology negative hematology ROS (+)   Anesthesia Other Findings   Reproductive/Obstetrics                             Anesthesia Physical Anesthesia Plan  ASA: III  Anesthesia Plan: MAC   Post-op Pain Management:    Induction: Intravenous  PONV Risk Score and Plan: 0 and Propofol infusion and Treatment may vary due to age or medical condition  Airway Management Planned: Simple Face Mask, Natural Airway and Nasal Cannula  Additional Equipment: None  Intra-op Plan:   Post-operative Plan:   Informed Consent: I have reviewed the patients History and Physical, chart, labs and discussed the procedure including the risks, benefits and alternatives for the proposed anesthesia with the patient or authorized representative who has indicated his/her understanding and acceptance.     Dental advisory given  Plan Discussed with: CRNA  Anesthesia Plan Comments: (Lab Results      Component                Value               Date                      WBC                      8.7                 11/07/2020                HGB                       15.1                11/07/2020                HCT                      49.5                11/07/2020                MCV                      105.8 (H)           11/07/2020                PLT  295                 11/07/2020           Lab Results      Component                Value               Date                      NA                       139                 11/08/2020                K                        4.4                 11/08/2020                CO2                      33 (H)              11/08/2020                GLUCOSE                  100 (H)             11/08/2020                BUN                      28 (H)              11/08/2020                CREATININE               0.83                11/08/2020                CALCIUM                  8.9                 11/08/2020                GFRNONAA                 >60                 11/08/2020                GFRAA                    >60                 01/09/2019           ECHO 11/05/20: 1. Left ventricular ejection fraction, by estimation, is 45 to 50%. The  left ventricle has mildly decreased function. The left ventricle  demonstrates global hypokinesis. Left ventricular diastolic function could  not be evaluated. There is the  interventricular septum is flattened in diastole ('D' shaped  left  ventricle), consistent with right ventricular volume overload. The average  left ventricular global longitudinal strain is -12.0 %. The global  longitudinal strain is abnormal.  2. Right ventricular systolic function is moderately reduced. The right  ventricular size is moderately enlarged. There is mildly elevated  pulmonary artery systolic pressure.  3. Right atrial size was severely dilated.  4. The mitral valve is normal in structure. No evidence of mitral valve  regurgitation.  5. The aortic valve is tricuspid. Aortic valve regurgitation is not  visualized. No aortic stenosis is present.   6. The inferior vena cava is dilated in size with <50% respiratory  variability, suggesting right atrial pressure of 15 mmHg.  7. Evidence of atrial level shunting detected by color flow Doppler.  There is a small secundum atrial septal defect with bidirectional shunting  across the atrial septum. )        Anesthesia Quick Evaluation

## 2020-11-08 NOTE — Transfer of Care (Signed)
Immediate Anesthesia Transfer of Care Note  Patient: Jordan Kelly  Procedure(s) Performed: TRANSESOPHAGEAL ECHOCARDIOGRAM (TEE) (N/A ) CARDIOVERSION (N/A ) BUBBLE STUDY  Patient Location: Endoscopy Unit  Anesthesia Type:MAC  Level of Consciousness: sedated  Airway & Oxygen Therapy: Patient connected to face mask oxygen  Post-op Assessment: Post -op Vital signs reviewed and stable  Post vital signs: stable  Last Vitals:  Vitals Value Taken Time  BP    Temp    Pulse    Resp    SpO2      Last Pain:  Vitals:   11/08/20 1118  TempSrc: Oral  PainSc: 0-No pain      Patients Stated Pain Goal: 0 (61/53/79 4327)  Complications: No complications documented.

## 2020-11-08 NOTE — Progress Notes (Signed)
Progress Note  Patient Name: Jordan Kelly Date of Encounter: 11/08/2020  Primary Cardiologist: No primary care provider on file.   Subjective   Since interval evaluation (PM 11/07/20) CT Head negative.  No further changes.  Started on nutritional supplements.  RUQ US done which may be consistent with cirrhosis.  Patient notes that he would like to get some of the energy back out from when he felt good; 5-7 years ago.  His SOB and energy have improved.  Inpatient Medications    Scheduled Meds:  apixaban  5 mg Oral BID   bisoprolol  5 mg Oral Daily   Chlorhexidine Gluconate Cloth  6 each Topical Q0600   feeding supplement  1 Container Oral TID BM   folic acid  1 mg Oral Daily   hydrocortisone cream  1 application Topical BID   influenza vac split quadrivalent PF  0.5 mL Intramuscular Tomorrow-1000   lactulose  20 g Oral BID   mouth rinse  15 mL Mouth Rinse BID   multivitamin with minerals  1 tablet Oral Daily   nicotine  21 mg Transdermal Daily   nystatin cream   Topical BID   pneumococcal 23 valent vaccine  0.5 mL Intramuscular Tomorrow-1000   thiamine  100 mg Oral Daily   Continuous Infusions:  sodium chloride Stopped (11/06/20 1415)   PRN Meds: acetaminophen, albuterol, LORazepam, magnesium hydroxide, ondansetron (ZOFRAN) IV   Vital Signs    Vitals:   11/08/20 0300 11/08/20 0400 11/08/20 0500 11/08/20 0600  BP: 93/74 104/74 90/66 95/68   Pulse: 75 (!) 50 75 74  Resp: 18 20 15 17   Temp:      TempSrc:      SpO2: 96% 97% 98% 100%  Weight:      Height:        Intake/Output Summary (Last 24 hours) at 11/08/2020 0743 Last data filed at 11/07/2020 1830 Gross per 24 hour  Intake 720 ml  Output 1300 ml  Net -580 ml   Filed Weights   11/04/20 1437 11/04/20 2330 11/06/20 0550  Weight: 87.1 kg 83.7 kg 83.5 kg    Telemetry    Atrial Flutter rates elevated post fall but have resolved - Personally Reviewed  ECG    No new - Personally Reviewed  Physical Exam    GEN: No acute distress.   Neck: No JVD Cardiac: Irregularly irregular rhythm, no murmurs, rubs, or gallops.  Respiratory: Expiratory wheezes with coarse breathssounds in bases, moves air GI: Soft, with non tender with dullness to percussion, umbilical hernia noted  MS: +2 edema; No deformity.  Neuro:  Nonfocal, no asterixis Psych: Normal affect  Skin:  Red macular rash in intertriginous zone stable from admission  Labs    Chemistry Recent Labs  Lab 11/04/20 1514 11/04/20 1540 11/06/20 0046 11/07/20 0042 11/08/20 0204  NA 137   < > 136 138 139  K 4.3   < > 4.5 4.4 4.4  CL 97*   < > 97* 97* 97*  CO2 30   < > 34* 36* 33*  GLUCOSE 111*   < > 121* 105* 100*  BUN 46*   < > 40* 36* 28*  CREATININE 1.28*   < > 1.26* 0.98 0.83  CALCIUM 8.5*   < > 8.4* 8.6* 8.9  PROT 7.0  --   --  6.1* 6.2*  ALBUMIN 2.9*  --   --  2.4* 2.5*  AST 70*  --   --  32 34  ALT  99*  --   --  56* 47*  ALKPHOS 108  --   --  92 93  BILITOT 0.9  --   --  0.9 0.8  GFRNONAA >60   < > >60 >60 >60  ANIONGAP 10   < > 5 5 9    < > = values in this interval not displayed.     Hematology Recent Labs  Lab 11/04/20 1514 11/04/20 1540 11/05/20 1041 11/07/20 0042  WBC 8.5  --  9.3 8.7  RBC 4.66  --  4.77 4.68  HGB 15.3 16.7 15.7 15.1  HCT 49.2 49.0 50.0 49.5  MCV 105.6*  --  104.8* 105.8*  MCH 32.8  --  32.9 32.3  MCHC 31.1  --  31.4 30.5  RDW 13.4  --  13.5 13.6  PLT 306  --  276 295    Cardiac EnzymesNo results for input(s): TROPONINI in the last 168 hours. No results for input(s): TROPIPOC in the last 168 hours.   BNP Recent Labs  Lab 11/04/20 1514 11/07/20 0042 11/08/20 0204  BNP 1,118.0* 1,195.6* 1,061.0*     DDimer No results for input(s): DDIMER in the last 168 hours.   Radiology    CT HEAD WO CONTRAST  Result Date: 11/07/2020 CLINICAL DATA:  Fall with head trauma.  On Eliquis. EXAM: CT HEAD WITHOUT CONTRAST TECHNIQUE: Contiguous axial images were obtained from the base of the skull  through the vertex without intravenous contrast. COMPARISON:  None. FINDINGS: The study is mildly motion degraded despite repeat imaging. Brain: There is no evidence of an acute infarct, intracranial hemorrhage, mass, midline shift, or extra-axial fluid collection. The ventricles and sulci are within normal limits for age. Vascular: No hyperdense vessel. Skull: No fracture or suspicious osseous lesion. Sinuses/Orbits: Visualized paranasal sinuses are clear. Small bilateral mastoid effusions. Unremarkable orbits. Other: Possible mild left frontal scalp soft tissue swelling. IMPRESSION: No evidence of acute intracranial abnormality. Electronically Signed   By: Logan Bores M.D.   On: 11/07/2020 12:34   US Abdomen Limited  Result Date: 11/07/2020 CLINICAL DATA:  Alcohol abuse.  Elevated LFTs. EXAM: ULTRASOUND ABDOMEN LIMITED RIGHT UPPER QUADRANT COMPARISON:  None. FINDINGS: Gallbladder: Partially distended. No gallstones. Mild wall thickening at 3 mm. No sonographic Murphy sign noted by sonographer. Common bile duct: Diameter: 3 mm, normal Liver: No focal lesion identified. Within normal limits in parenchymal echogenicity. There is questionable lobulated contours. Portal vein is patent on color Doppler imaging with normal direction of blood flow towards the liver. Other: No right upper quadrant ascites. IMPRESSION: 1. Questionable micro lobulated hepatic contours which can be seen with cirrhosis. No focal lesion. 2. Upper normal gallbladder wall thickness is likely related to degree of distension. No gallstones or sonographic findings of acute cholecystitis. Electronically Signed   By: Keith Rake M.D.   On: 11/07/2020 19:18    Cardiac Studies   Bidirectional shunting noted (subcostal Color)  Patient Profile     60 y.o. 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 - without frank RV failure; if having a secundum defect would be appropriate for RHC and consideration of closure if other comorbidity could be safely addressed - planned for TEE (see below) - increasing lasix to 60 mg PO IV  Typical Atrial Flutter Fall without  evidence of intracranial bleed - planned for TEE/DCCV scheduled today  - continue new start DOAC - rate controlled on bisoprolol 5 mg  COPD Tobacco Abuse - has Nicotine Patch - restarting his home albuterol PRN SOB - [facility] assisting; if we cannot wean O2 with diuresis will attempt steroid burst  Skin Rash - contine nystatin cream - appreciate [facility]  Alcohol abuse with prior withdrawal symptoms Tranaminitis with US findings that may suggest cirrhosis Constipation Umbilical hernia with prior repair Hyperammonemia without asterixis Malnutrition with albumin 2.9 - NPO  - started thiamine - f/u ammonia pending - starting Senna; appreciate [facility] recs - lasix as above - may add spironolactone through course -  Continue CIWA  AKI  -  improving with diuresis  DVT Proph- DOAC Labs ordered  Full Code Diet- NPO at midnight > cardiac with protein supplements once procedure is finished  If stable through morning-> to floor Low threshold for PT/OT assessment as withdrawal improves; appear significantly deconditioned   For questions or updates, please contact [place] Please consult [email or web address] for contact info under Cardiology/STEMI.      Signed, Werner Lean, MD  11/08/2020, 7:43 AM

## 2020-11-08 NOTE — H&P (View-Only) (Signed)
Progress Note  Patient Name: Jordan Kelly Date of Encounter: 11/08/2020  Primary Cardiologist: No primary care provider on file.   Subjective   Since interval evaluation (PM 11/07/20) CT Head negative.  No further changes.  Started on nutritional supplements.  RUQ US done which may be consistent with cirrhosis.  Patient notes that he would like to get some of the energy back out from when he felt good; 5-7 years ago.  His SOB and energy have improved.  Inpatient Medications    Scheduled Meds:  apixaban  5 mg Oral BID   bisoprolol  5 mg Oral Daily   Chlorhexidine Gluconate Cloth  6 each Topical Q0600   feeding supplement  1 Container Oral TID BM   folic acid  1 mg Oral Daily   hydrocortisone cream  1 application Topical BID   influenza vac split quadrivalent PF  0.5 mL Intramuscular Tomorrow-1000   lactulose  20 g Oral BID   mouth rinse  15 mL Mouth Rinse BID   multivitamin with minerals  1 tablet Oral Daily   nicotine  21 mg Transdermal Daily   nystatin cream   Topical BID   pneumococcal 23 valent vaccine  0.5 mL Intramuscular Tomorrow-1000   thiamine  100 mg Oral Daily   Continuous Infusions:  sodium chloride Stopped (11/06/20 1415)   PRN Meds: acetaminophen, albuterol, LORazepam, magnesium hydroxide, ondansetron (ZOFRAN) IV   Vital Signs    Vitals:   11/08/20 0300 11/08/20 0400 11/08/20 0500 11/08/20 0600  BP: 93/74 104/74 90/66 95/68   Pulse: 75 (!) 50 75 74  Resp: 18 20 15 17   Temp:      TempSrc:      SpO2: 96% 97% 98% 100%  Weight:      Height:        Intake/Output Summary (Last 24 hours) at 11/08/2020 0743 Last data filed at 11/07/2020 1830 Gross per 24 hour  Intake 720 ml  Output 1300 ml  Net -580 ml   Filed Weights   11/04/20 1437 11/04/20 2330 11/06/20 0550  Weight: 87.1 kg 83.7 kg 83.5 kg    Telemetry    Atrial Flutter rates elevated post fall but have resolved - Personally Reviewed  ECG    No new - Personally Reviewed  Physical Exam    GEN: No acute distress.   Neck: No JVD Cardiac: Irregularly irregular rhythm, no murmurs, rubs, or gallops.  Respiratory: Expiratory wheezes with coarse breathssounds in bases, moves air GI: Soft, with non tender with dullness to percussion, umbilical hernia noted  MS: +2 edema; No deformity.  Neuro:  Nonfocal, no asterixis Psych: Normal affect  Skin:  Red macular rash in intertriginous zone stable from admission  Labs    Chemistry Recent Labs  Lab 11/04/20 1514 11/04/20 1540 11/06/20 0046 11/07/20 0042 11/08/20 0204  NA 137   < > 136 138 139  K 4.3   < > 4.5 4.4 4.4  CL 97*   < > 97* 97* 97*  CO2 30   < > 34* 36* 33*  GLUCOSE 111*   < > 121* 105* 100*  BUN 46*   < > 40* 36* 28*  CREATININE 1.28*   < > 1.26* 0.98 0.83  CALCIUM 8.5*   < > 8.4* 8.6* 8.9  PROT 7.0  --   --  6.1* 6.2*  ALBUMIN 2.9*  --   --  2.4* 2.5*  AST 70*  --   --  32 34  ALT  99*  --   --  56* 47*  ALKPHOS 108  --   --  92 93  BILITOT 0.9  --   --  0.9 0.8  GFRNONAA >60   < > >60 >60 >60  ANIONGAP 10   < > 5 5 9    < > = values in this interval not displayed.     Hematology Recent Labs  Lab 11/04/20 1514 11/04/20 1540 11/05/20 1041 11/07/20 0042  WBC 8.5  --  9.3 8.7  RBC 4.66  --  4.77 4.68  HGB 15.3 16.7 15.7 15.1  HCT 49.2 49.0 50.0 49.5  MCV 105.6*  --  104.8* 105.8*  MCH 32.8  --  32.9 32.3  MCHC 31.1  --  31.4 30.5  RDW 13.4  --  13.5 13.6  PLT 306  --  276 295    Cardiac EnzymesNo results for input(s): TROPONINI in the last 168 hours. No results for input(s): TROPIPOC in the last 168 hours.   BNP Recent Labs  Lab 11/04/20 1514 11/07/20 0042 11/08/20 0204  BNP 1,118.0* 1,195.6* 1,061.0*     DDimer No results for input(s): DDIMER in the last 168 hours.   Radiology    CT HEAD WO CONTRAST  Result Date: 11/07/2020 CLINICAL DATA:  Fall with head trauma.  On Eliquis. EXAM: CT HEAD WITHOUT CONTRAST TECHNIQUE: Contiguous axial images were obtained from the base of the skull  through the vertex without intravenous contrast. COMPARISON:  None. FINDINGS: The study is mildly motion degraded despite repeat imaging. Brain: There is no evidence of an acute infarct, intracranial hemorrhage, mass, midline shift, or extra-axial fluid collection. The ventricles and sulci are within normal limits for age. Vascular: No hyperdense vessel. Skull: No fracture or suspicious osseous lesion. Sinuses/Orbits: Visualized paranasal sinuses are clear. Small bilateral mastoid effusions. Unremarkable orbits. Other: Possible mild left frontal scalp soft tissue swelling. IMPRESSION: No evidence of acute intracranial abnormality. Electronically Signed   By: Logan Bores M.D.   On: 11/07/2020 12:34   US Abdomen Limited  Result Date: 11/07/2020 CLINICAL DATA:  Alcohol abuse.  Elevated LFTs. EXAM: ULTRASOUND ABDOMEN LIMITED RIGHT UPPER QUADRANT COMPARISON:  None. FINDINGS: Gallbladder: Partially distended. No gallstones. Mild wall thickening at 3 mm. No sonographic Murphy sign noted by sonographer. Common bile duct: Diameter: 3 mm, normal Liver: No focal lesion identified. Within normal limits in parenchymal echogenicity. There is questionable lobulated contours. Portal vein is patent on color Doppler imaging with normal direction of blood flow towards the liver. Other: No right upper quadrant ascites. IMPRESSION: 1. Questionable micro lobulated hepatic contours which can be seen with cirrhosis. No focal lesion. 2. Upper normal gallbladder wall thickness is likely related to degree of distension. No gallstones or sonographic findings of acute cholecystitis. Electronically Signed   By: Keith Rake M.D.   On: 11/07/2020 19:18    Cardiac Studies   Bidirectional shunting noted (subcostal Color)  Patient Profile     60 y.o. 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 - without frank RV failure; if having a secundum defect would be appropriate for RHC and consideration of closure if other comorbidity could be safely addressed - planned for TEE (see below) - increasing lasix to 60 mg PO IV  Typical Atrial Flutter Fall without  evidence of intracranial bleed - planned for TEE/DCCV scheduled today  - continue new start DOAC - rate controlled on bisoprolol 5 mg  COPD Tobacco Abuse - has Nicotine Patch - restarting his home albuterol PRN SOB - [facility] assisting; if we cannot wean O2 with diuresis will attempt steroid burst  Skin Rash - contine nystatin cream - appreciate [facility] Recs  Alcohol abuse with prior withdrawal symptoms Tranaminitis with US findings that may suggest cirrhosis Constipation Umbilical hernia with prior repair Hyperammonemia without asterixis Malnutrition with albumin 2.9 - NPO  - started thiamine - f/u ammonia pending - starting Senna; appreciate [facility] recs - lasix as above - may add spironolactone through course -  Continue CIWA  AKI  -  improving with diuresis  DVT Proph- DOAC Labs ordered  Full Code Diet- NPO at midnight > cardiac with protein supplements once procedure is finished  If stable through morning-> to floor Low threshold for PT/OT assessment as withdrawal improves; appear significantly deconditioned   For questions or updates, please contact [place] Please consult [email or web address] for contact info under Cardiology/STEMI.      Signed, Werner Lean, MD  11/08/2020, 7:43 AM

## 2020-11-08 NOTE — Progress Notes (Signed)
  Echocardiogram Echocardiogram Transesophageal has been performed.  Jordan Kelly 11/08/2020, 1:48 PM

## 2020-11-08 NOTE — Progress Notes (Signed)
Consult Progress Note    Jordan Kelly   NTI:[id number]  DOB: 11-21-1960  DOA: 11/04/2020     4  PCP: Noreene Larsson, NP  Reason for consult: rash, history of alcohol use  Hospital Course: ALECXANDER Kelly is a 60 y.o. male with PMH tobacco and alcohol abuse who initially presented with complaints of constipation, fatigue, and edema on 3/26.  Patient was initially found to be in atrial flutter.  Labs were significant for BUN 46, creatinine 1.28, AST 70, ALT 99, and BNP 1119.   Patient was initially started on Cardizem drip, but was transitioned to bisoprolol and tolerated well.  He has a history of drinking approx 6-pack beer daily. 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  He appears disheveled/unkempt when evaluated.  There were reports of patient having a pruritic rash notably on his right thigh and also a more pronounced rash in his groin region.  Rash was noted to have worsened recently with heat exposure. He also has been constipated and was given a dose of lactulose with good response.  Interval History:  No further issues s/p fall from yesterday. Sore face otherwise he is more awake/alert today.  We were able to carry on adequate conversation.  He endorses some shaking in his hands when they are outstretched when I was assessing for asterixis (he does note this is a new thing for him, and denies prior episodes of similar). Plan is for undergoing TEE and cardioversion today with cardiology. He denies any chest pain or shortness of breath when seen this morning.  ROS: Constitutional: negative for chills and fevers, Respiratory: negative for cough, Cardiovascular: negative and Gastrointestinal: negative for abdominal pain  Assessment & Plan:  Atrial flutter -rate in low 100s this am -Continue bisoprolol -Continue Eliquis.  CT head negative after fall on 3/29 -Cardiology managing.  Plan is for TEE/DCCV today; successful CV after 1 shock  HFrEF - Patient still with  some lower extremity edema.  BNP was initially 1119 echocardiogram revealed EF of 45 to 50%.  - Currently being diuresed with IV Lasix. - management per cardiology/primary team  Contact dermatitis Cutaneous candidiasis - likely heat related/poor hygeine rash noted on thighs and groin; agree likely combo of candidiasis and contact dermatitis from sweaty/dirty clothes -Continue nystatin cream to groin region -Continue hydrocortisone cream to thighs  Ongoing alcohol abuse Transaminitis Alcoholic cirrhosis without ascites  -Continue on CIWA.  Scores are downtrending.  Ativan changed to PRN only -Start thiamine, folic acid, multivitamins - Hepatitis panel negative - RUQ u/s performed on 3/29, early signs of cirrhosis; patient heavily counseled for alcohol cessation  - repeat NH3 on 3/29 higher (35>>76); start back on lactulose (patient noted to be encephalopathic on 3/29 which may have also been multifactorial at that time); he has asterixis on my exam when he was able to finally partake today (11/08/20)  COPD Tobacco Use - hold off on steroids, especially with RVR - continue nicotine patch and breathing treatments   Acute kidney injury - baseline creatinine ~ 0.8 - patient presents with increase in creat >0.3 mg/dL above baseline - probable CRS vs arrhythmia mediated -Renal function responding to diuresis -BMP daily  Constipation -hold senna for now while re-starting lactulose - will modify as needed  Old records reviewed in assessment of this patient  Antimicrobials: n/a  DVT prophylaxis:  apixaban (ELIQUIS) tablet 5 mg   Code Status:   Code Status: Full Code  Objective: Blood pressure 107/78, pulse  81, temperature 97.9 F (36.6 C), temperature source Axillary, resp. rate (!) 22, height 5\' 10"  (1.778 m), weight 83.5 kg, SpO2 92 %.  Examination: General appearance: More awake and alert today.  Able to answer questions and follow all commands Head: Normocephalic, without  obvious abnormality, atraumatic Eyes: Pupils are equal today, 2 mm each and reactive to light equally Lungs: clear to auscultation bilaterally Heart: irregularly irregular rhythm, S1, S2 normal and Tachycardic Abdomen: Distended.  Nontender.  Bowel sounds distant but present Extremities: Lower extremity edema noted Skin: mobility and turgor normal Neurologic: Asterixis appreciated.  No other focal deficits  Data Reviewed: I have personally reviewed following labs and imaging studies Results for orders placed or performed during the hospital encounter of 11/04/20 (from the past 24 hour(s))  Ammonia     Status: Abnormal   Collection Time: 11/07/20  1:06 PM  Result Value Ref Range   Ammonia 76 (H) 9 - 35 umol/L  Hepatitis panel, acute     Status: None   Collection Time: 11/07/20  1:06 PM  Result Value Ref Range   Hepatitis B Surface Ag NON REACTIVE NON REACTIVE   HCV Ab NON REACTIVE NON REACTIVE   Hep A IgM NON REACTIVE NON REACTIVE   Hep B C IgM NON REACTIVE NON REACTIVE  Brain natriuretic peptide     Status: Abnormal   Collection Time: 11/08/20  2:04 AM  Result Value Ref Range   B Natriuretic Peptide 1,061.0 (H) 0.0 - 100.0 pg/mL  Comprehensive metabolic panel     Status: Abnormal   Collection Time: 11/08/20  2:04 AM  Result Value Ref Range   Sodium 139 135 - 145 mmol/L   Potassium 4.4 3.5 - 5.1 mmol/L   Chloride 97 (L) 98 - 111 mmol/L   CO2 33 (H) 22 - 32 mmol/L   Glucose, Bld 100 (H) 70 - 99 mg/dL   BUN 28 (H) 6 - 20 mg/dL   Creatinine, Ser 0.83 0.61 - 1.24 mg/dL   Calcium 8.9 8.9 - 10.3 mg/dL   Total Protein 6.2 (L) 6.5 - 8.1 g/dL   Albumin 2.5 (L) 3.5 - 5.0 g/dL   AST 34 15 - 41 U/L   ALT 47 (H) 0 - 44 U/L   Alkaline Phosphatase 93 38 - 126 U/L   Total Bilirubin 0.8 0.3 - 1.2 mg/dL   GFR, Estimated >60 >60 mL/min   Anion gap 9 5 - 15    Recent Results (from the past 240 hour(s))  SARS CORONAVIRUS 2 (TAT 6-24 HRS) Nasopharyngeal Nasopharyngeal Swab     Status: None    Collection Time: 11/04/20  3:01 PM   Specimen: Nasopharyngeal Swab  Result Value Ref Range Status   SARS Coronavirus 2 NEGATIVE NEGATIVE Final    Comment: (NOTE) SARS-CoV-2 target nucleic acids are NOT DETECTED.  The SARS-CoV-2 RNA is generally detectable in upper and lower respiratory specimens during the acute phase of infection. Negative results do not preclude SARS-CoV-2 infection, do not rule out co-infections with other pathogens, and should not be used as the sole basis for treatment or other patient management decisions. Negative results must be combined with clinical observations, patient history, and epidemiological information. The expected result is Negative.  Fact Sheet for Patients: SugarRoll.be  Fact Sheet for Healthcare Providers: https://www.woods-mathews.com/  This test is not yet approved or cleared by the [place] FDA and  has been authorized for detection and/or diagnosis of SARS-CoV-2 by FDA under an Emergency Use Authorization (EUA). This  EUA will remain  in effect (meaning this test can be used) for the duration of the COVID-19 declaration under Se ction 564(b)(1) of the Act, 21 U.S.C. section 360bbb-3(b)(1), unless the authorization is terminated or revoked sooner.  Performed at [facility], [place] [address], [place], [place] [address]   Blood culture (routine x 2)     Status: None (Preliminary result)   Collection Time: 11/04/20  3:44 PM   Specimen: Left Antecubital; Blood  Result Value Ref Range Status   Specimen Description   Final    LEFT ANTECUBITAL BOTTLES DRAWN AEROBIC AND ANAEROBIC   Special Requests Blood Culture adequate volume  Final   Culture   Final    NO GROWTH 4 DAYS Performed at [facility], [address], [place], [place] [address]    Report Status PENDING  Incomplete  Blood culture (routine x 2)     Status: None (Preliminary result)   Collection Time: 11/04/20  3:46 PM    Specimen: BLOOD RIGHT ARM  Result Value Ref Range Status   Specimen Description   Final    BLOOD RIGHT ARM BOTTLES DRAWN AEROBIC AND ANAEROBIC   Special Requests Blood Culture adequate volume  Final   Culture   Final    NO GROWTH 4 DAYS Performed at [facility], [address] [place], [place] [address]    Report Status PENDING  Incomplete  MRSA PCR Screening     Status: None   Collection Time: 11/04/20 10:55 PM   Specimen: Nasopharyngeal  Result Value Ref Range Status   MRSA by PCR NEGATIVE NEGATIVE Final    Comment:        The GeneXpert MRSA Assay (FDA approved for NASAL specimens only), is one component of a comprehensive MRSA colonization surveillance program. It is not intended to diagnose MRSA infection nor to guide or monitor treatment for MRSA infections. Performed at [facility], [place] [address] [place], [place] [address]      Radiology Studies: CT HEAD WO CONTRAST  Result Date: 11/07/2020 CLINICAL DATA:  Fall with head trauma.  On Eliquis. EXAM: CT HEAD WITHOUT CONTRAST TECHNIQUE: Contiguous axial images were obtained from the base of the skull through the vertex without intravenous contrast. COMPARISON:  None. FINDINGS: The study is mildly motion degraded despite repeat imaging. Brain: There is no evidence of an acute infarct, intracranial hemorrhage, mass, midline shift, or extra-axial fluid collection. The ventricles and sulci are within normal limits for age. Vascular: No hyperdense vessel. Skull: No fracture or suspicious osseous lesion. Sinuses/Orbits: Visualized paranasal sinuses are clear. Small bilateral mastoid effusions. Unremarkable orbits. Other: Possible mild left frontal scalp soft tissue swelling. IMPRESSION: No evidence of acute intracranial abnormality. Electronically Signed   By: Logan Bores M.D.   On: 11/07/2020 12:34   US Abdomen Limited  Result Date: 11/07/2020 CLINICAL DATA:  Alcohol abuse.  Elevated LFTs. EXAM: ULTRASOUND ABDOMEN LIMITED  RIGHT UPPER QUADRANT COMPARISON:  None. FINDINGS: Gallbladder: Partially distended. No gallstones. Mild wall thickening at 3 mm. No sonographic Murphy sign noted by sonographer. Common bile duct: Diameter: 3 mm, normal Liver: No focal lesion identified. Within normal limits in parenchymal echogenicity. There is questionable lobulated contours. Portal vein is patent on color Doppler imaging with normal direction of blood flow towards the liver. Other: No right upper quadrant ascites. IMPRESSION: 1. Questionable micro lobulated hepatic contours which can be seen with cirrhosis. No focal lesion. 2. Upper normal gallbladder wall thickness is likely related to degree of  distension. No gallstones or sonographic findings of acute cholecystitis. Electronically Signed   By: Keith Rake M.D.   On: 11/07/2020 19:18   US Abdomen Limited  Final Result    CT HEAD WO CONTRAST  Final Result    DG Chest Port 1 View  Final Result      Scheduled Meds: . [MAR Hold] apixaban  5 mg Oral BID  . [MAR Hold] bisoprolol  5 mg Oral Daily  . [MAR Hold] Chlorhexidine Gluconate Cloth  6 each Topical Q0600  . [MAR Hold] feeding supplement  1 Container Oral TID BM  . [MAR Hold] folic acid  1 mg Oral Daily  . [MAR Hold] furosemide  60 mg Intravenous Daily  . [MAR Hold] hydrocortisone cream  1 application Topical BID  . influenza vac split quadrivalent PF  0.5 mL Intramuscular Tomorrow-1000  . [MAR Hold] lactulose  20 g Oral BID  . [MAR Hold] mouth rinse  15 mL Mouth Rinse BID  . [MAR Hold] multivitamin with minerals  1 tablet Oral Daily  . [MAR Hold] nicotine  21 mg Transdermal Daily  . [MAR Hold] nystatin cream   Topical BID  . pneumococcal 23 valent vaccine  0.5 mL Intramuscular Tomorrow-1000  . [MAR Hold] thiamine  100 mg Oral Daily   PRN Meds: [MAR Hold] acetaminophen, [MAR Hold] albuterol, [MAR Hold] LORazepam, [MAR Hold] magnesium hydroxide, [MAR Hold] ondansetron (ZOFRAN) IV Continuous Infusions: . sodium  chloride 20 mL/hr at 11/08/20 1132     LOS: 4 days  Time spent: Greater than 50% of the 35 minute visit was spent in counseling/coordination of care for the patient as laid out in the A&P.   Dwyane Dee, MD [facility] 11/08/2020, 1:02 PM

## 2020-11-08 NOTE — CV Procedure (Signed)
   Transesophageal Echocardiogram  Indications: ?ASD  Time out performed  Findings:  Left Ventricle: Normal EF 65%  Mitral Valve: Normal, trace MR  Aortic Valve: Normal, trileaflet  Tricuspid Valve: Normal   Left Atrium: No LAA thrombus. Normal PV entry. Normal coronary sinus  Atrial septum: there is right to left color flow Doppler shunt with mid septal secundum ASD. There is adequate landing zone for closure device.   Bubble Contrast Study: Positive R to L shunt early.   Candee Furbish, MD     Electrical Cardioversion Procedure Note TADASHI BURKEL [id number] [birth year]  Procedure: Electrical Cardioversion Indications:  Atrial Flutter  Time Out: Verified patient identification, verified procedure,medications/allergies/relevent history reviewed, required imaging and test results available.  Performed  Procedure Details  The patient was NPO after midnight. Anesthesia was administered at the beside  with propofol.  Cardioversion was performed with synchronized biphasic defibrillation via AP pads with 120 joules.  1 attempt(s) were performed.  The patient converted to normal sinus rhythm. The patient tolerated the procedure well   IMPRESSION:  Successful cardioversion of atrial flutter.    Candee Furbish 11/08/2020, 12:31 PM

## 2020-11-09 ENCOUNTER — Encounter (HOSPITAL_COMMUNITY): Payer: Self-pay | Admitting: Cardiology

## 2020-11-09 DIAGNOSIS — Q211 Atrial septal defect: Secondary | ICD-10-CM | POA: Diagnosis not present

## 2020-11-09 DIAGNOSIS — I50813 Acute on chronic right heart failure: Secondary | ICD-10-CM | POA: Diagnosis not present

## 2020-11-09 DIAGNOSIS — J449 Chronic obstructive pulmonary disease, unspecified: Secondary | ICD-10-CM | POA: Diagnosis not present

## 2020-11-09 DIAGNOSIS — I483 Typical atrial flutter: Secondary | ICD-10-CM | POA: Diagnosis not present

## 2020-11-09 DIAGNOSIS — F1010 Alcohol abuse, uncomplicated: Secondary | ICD-10-CM | POA: Diagnosis not present

## 2020-11-09 LAB — COMPREHENSIVE METABOLIC PANEL
ALT: 46 U/L — ABNORMAL HIGH (ref 0–44)
AST: 33 U/L (ref 15–41)
Albumin: 2.6 g/dL — ABNORMAL LOW (ref 3.5–5.0)
Alkaline Phosphatase: 86 U/L (ref 38–126)
Anion gap: 5 (ref 5–15)
BUN: 22 mg/dL — ABNORMAL HIGH (ref 6–20)
CO2: 39 mmol/L — ABNORMAL HIGH (ref 22–32)
Calcium: 8.7 mg/dL — ABNORMAL LOW (ref 8.9–10.3)
Chloride: 97 mmol/L — ABNORMAL LOW (ref 98–111)
Creatinine, Ser: 0.83 mg/dL (ref 0.61–1.24)
GFR, Estimated: >60 mL/min (ref >60)
Glucose, Bld: 99 mg/dL (ref 70–99)
Potassium: 4.3 mmol/L (ref 3.5–5.1)
Sodium: 141 mmol/L (ref 135–145)
Total Bilirubin: 1 mg/dL (ref 0.3–1.2)
Total Protein: 6.4 g/dL — ABNORMAL LOW (ref 6.5–8.1)

## 2020-11-09 LAB — CULTURE, BLOOD (ROUTINE X 2)
Culture: NO GROWTH
Culture: NO GROWTH
Special Requests: ADEQUATE
Special Requests: ADEQUATE

## 2020-11-09 LAB — MAGNESIUM: Magnesium: 1.8 mg/dL (ref 1.7–2.4)

## 2020-11-09 LAB — AMMONIA: Ammonia: 32 umol/L (ref 9–35)

## 2020-11-09 MED ORDER — LORAZEPAM 2 MG/ML IJ SOLN: 1.0000 mg | INTRAMUSCULAR | Status: DC | PRN

## 2020-11-09 MED ORDER — LORAZEPAM 1 MG PO TABS
1.0000 mg | ORAL_TABLET | ORAL | Status: DC | PRN
  Administered 2020-11-09 – 2020-11-13 (×5): 1 mg via ORAL
  Filled 2020-11-09 (×5): qty 1

## 2020-11-09 MED ORDER — FUROSEMIDE 10 MG/ML IJ SOLN
80.0000 mg | Freq: Every day | INTRAMUSCULAR | Status: DC
  Administered 2020-11-09 – 2020-11-13 (×5): 80 mg via INTRAVENOUS
  Filled 2020-11-09 (×5): qty 8

## 2020-11-09 NOTE — Plan of Care (Signed)
  Problem: Clinical Measurements: Goal: Ability to maintain clinical measurements within normal limits will improve Outcome: Progressing   Problem: Activity: Goal: Risk for activity intolerance will decrease Outcome: Progressing   Problem: Nutrition: Goal: Adequate nutrition will be maintained Outcome: Progressing   Problem: Elimination: Goal: Will not experience complications related to bowel motility Outcome: Progressing Goal: Will not experience complications related to urinary retention Outcome: Progressing   Problem: Pain Managment: Goal: General experience of comfort will improve Outcome: Progressing   Problem: Coping: Goal: Level of anxiety will decrease Outcome: Not Progressing

## 2020-11-09 NOTE — Evaluation (Signed)
Physical Therapy Evaluation Patient Details Name: Jordan Kelly MRN: [id number] DOB: 1961/06/30 Today's Date: 11/09/2020   History of Present Illness  60 yo admitted 3/26 with fatigue and edema found to be in [place]. Pt with candidiasis and contact dermatitis of thigh. Fall on 3/29. TEE 3/30. Pt with CHF and cirrhosis, agitation and hallucination with withdrawal acutely. PMhx: tobacco and ETOH abuse, COPD  Clinical Impression  Pt pleasant and eager to move stating his hip his sore from being in bed.Pt with impaired balance, transfers, gait and safety who will benefit from acute therapy to maximize mobility, safety and independence. Pt educated for current need for RW with all standing activity and supervision for safety. Encouraged OOB daily with staff assist.     Pt on 4L throughout session with SpO2 88-93%. HR 82-87.     Follow Up Recommendations Home health PT;Supervision for mobility/OOB    Equipment Recommendations  Rolling walker with 5" wheels    Recommendations for Other Services       Precautions / Restrictions Precautions Precautions: Fall      Mobility  Bed Mobility Overal bed mobility: Needs Assistance Bed Mobility: Supine to Sit     Supine to sit: Supervision     General bed mobility comments: supervision for lines and safety as pt unsteady and slightly impulsive    Transfers Overall transfer level: Needs assistance   Transfers: Sit to/from Stand Sit to Stand: Min guard         General transfer comment: guarding for safety and balance  Ambulation/Gait Ambulation/Gait assistance: Min assist Gait Distance (Feet): 200 Feet Assistive device: None;Rolling walker (2 wheeled) Gait Pattern/deviations: Step-through pattern;Decreased stride length;Scissoring   Gait velocity interpretation: 1.31 - 2.62 ft/sec, indicative of limited community ambulator General Gait Details: walked 200' without AD with pt requiring min assist with up to mod assist during 5  periods of LOB with physical assist to prevent fall. Pt then walked 58' with Rw with significantly improved balance and gait and only minguard assist  Stairs            Wheelchair Mobility    Modified Rankin (Stroke Patients Only)       Balance Overall balance assessment: Needs assistance Sitting-balance support: No upper extremity supported;Feet supported Sitting balance-Leahy Scale: Good     Standing balance support: No upper extremity supported Standing balance-Leahy Scale: Poor Standing balance comment: LOB without assist during gait able to static stand for short periods without assist. RW for safety and control with gait                             Pertinent Vitals/Pain Pain Assessment: No/denies pain    Home Living Family/patient expects to be discharged to:: Private residence Living Arrangements: Alone Available Help at Discharge: Family;Available PRN/intermittently Type of Home: House Home Access: Level entry     Home Layout: One level Home Equipment: None      Prior Function Level of Independence: Independent               Hand Dominance        Extremity/Trunk Assessment   Upper Extremity Assessment Upper Extremity Assessment: Overall WFL for tasks assessed    Lower Extremity Assessment Lower Extremity Assessment: Overall WFL for tasks assessed    Cervical / Trunk Assessment Cervical / Trunk Assessment: Kyphotic  Communication   Communication: No difficulties  Cognition Arousal/Alertness: Awake/alert Behavior During Therapy: WFL for tasks assessed/performed Overall Cognitive  Status: Impaired/Different from baseline Area of Impairment: Safety/judgement;Memory                     Memory: Decreased short-term memory   Safety/Judgement: Decreased awareness of deficits;Decreased awareness of safety     General Comments: pt impulsively standing after instructed to wait. Pt withmutiple LOB and lack of awareness of  deficits for safety implications at D/C      General Comments      Exercises     Assessment/Plan    PT Assessment Patient needs continued PT services  PT Problem List Decreased strength;Decreased mobility;Decreased safety awareness;Decreased cognition;Decreased balance;Decreased knowledge of use of DME       PT Treatment Interventions Gait training;Functional mobility training;Therapeutic activities;Patient/family education;Cognitive remediation;Balance training;Neuromuscular re-education;DME instruction;Therapeutic exercise    PT Goals (Current goals can be found in the Care Plan section)  Acute Rehab PT Goals 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) PT Goal Formulation: With patient Time For Goal Achievement: 11/23/20 Potential to Achieve Goals: Good    Frequency Min 3X/week   Barriers to discharge Decreased caregiver support      Co-evaluation               AM-PAC PT "6 Clicks" Mobility  Outcome Measure Help needed turning from your back to your side while in a flat bed without using bedrails?: A Little Help needed moving from lying on your back to sitting on the side of a flat bed without using bedrails?: A Little Help needed moving to and from a bed to a chair (including a wheelchair)?: A Little Help needed standing up from a chair using your arms (e.g., wheelchair or bedside chair)?: A Little Help needed to walk in hospital room?: A Little Help needed climbing 3-5 steps with a railing? : A Little 6 Click Score: 18    End of Session Equipment Utilized During Treatment: Gait belt Activity Tolerance: Patient tolerated treatment well Patient left: in chair;with call bell/phone within reach;with chair alarm set Nurse Communication: Mobility status;Precautions PT Visit Diagnosis: Other abnormalities of gait and mobility (R26.89);Difficulty in walking, not elsewhere classified (R26.2)    Time: 1191-4782 PT Time Calculation (min) (ACUTE ONLY): 20  min   Charges:   PT Evaluation $PT Eval Moderate Complexity: 1 Mod          Oskar Cretella P, PT Acute Rehabilitation Services Pager: [phone] Office: [phone]   Sandy Salaam Dayla Gasca 11/09/2020, 1:33 PM

## 2020-11-09 NOTE — Progress Notes (Signed)
Progress Note  Patient Name: Jordan Kelly Date of Encounter: 11/09/2020  Primary Cardiologist: No primary care provider on file.   Subjective   More alert from prior.  Patient notes increased agitation without hallucination.  TEE performed without issues.  Back into SR.  Wants to take a shower.  Inpatient Medications    Scheduled Meds: . apixaban  5 mg Oral BID  . bisoprolol  5 mg Oral Daily  . Chlorhexidine Gluconate Cloth  6 each Topical Q0600  . feeding supplement  1 Container Oral TID BM  . folic acid  1 mg Oral Daily  . furosemide  80 mg Intravenous Daily  . hydrocortisone cream  1 application Topical BID  . influenza vac split quadrivalent PF  0.5 mL Intramuscular Tomorrow-1000  . lactulose  20 g Oral BID  . mouth rinse  15 mL Mouth Rinse BID  . multivitamin with minerals  1 tablet Oral Daily  . nicotine  21 mg Transdermal Daily  . nystatin cream   Topical BID  . pneumococcal 23 valent vaccine  0.5 mL Intramuscular Tomorrow-1000  . thiamine  100 mg Oral Daily   Continuous Infusions:  PRN Meds: acetaminophen, albuterol, magnesium hydroxide, ondansetron (ZOFRAN) IV   Vital Signs    Vitals:   11/09/20 0500 11/09/20 0600 11/09/20 0700 11/09/20 0745  BP: 117/83 113/84 114/90   Pulse: 76     Resp: (!) 21 17 16    Temp:    98.8 F (37.1 C)  TempSrc:    Oral  SpO2: (!) 87%     Weight:  80.6 kg    Height:       No intake or output data in the 24 hours ending 11/09/20 0812 Filed Weights   11/06/20 0550 11/08/20 1118 11/09/20 0600  Weight: 83.5 kg 83.5 kg 80.6 kg    Telemetry    SR - Personally Reviewed  ECG    SR rate 82 anterior TWI - Personally Reviewed  Physical Exam   GEN: No acute distress.   Neck: No JVD Cardiac: regular rate and rhythm, II/VI holosystolic murmur,no  rubs, or gallops.  Respiratory: Expiratory wheezes with coarse breaths sounds in bases, moves air GI: Soft, with non tender non distended; umbilical hernia noted MS: +1 edema; No  deformity.  Neuro:  Nonfocal, no asterixis Psych: Normal affect  Skin:  Red macular rash in intertriginous zone stable from admission  Labs    Chemistry Recent Labs  Lab 11/07/20 0042 11/08/20 0204 11/09/20 0100  NA 138 139 141  K 4.4 4.4 4.3  CL 97* 97* 97*  CO2 36* 33* 39*  GLUCOSE 105* 100* 99  BUN 36* 28* 22*  CREATININE 0.98 0.83 0.83  CALCIUM 8.6* 8.9 8.7*  PROT 6.1* 6.2* 6.4*  ALBUMIN 2.4* 2.5* 2.6*  AST 32 34 33  ALT 56* 47* 46*  ALKPHOS 92 93 86  BILITOT 0.9 0.8 1.0  GFRNONAA >60 >60 >60  ANIONGAP 5 9 5      Hematology Recent Labs  Lab 11/04/20 1514 11/04/20 1540 11/05/20 1041 11/07/20 0042  WBC 8.5  --  9.3 8.7  RBC 4.66  --  4.77 4.68  HGB 15.3 16.7 15.7 15.1  HCT 49.2 49.0 50.0 49.5  MCV 105.6*  --  104.8* 105.8*  MCH 32.8  --  32.9 32.3  MCHC 31.1  --  31.4 30.5  RDW 13.4  --  13.5 13.6  PLT 306  --  276 295    Cardiac EnzymesNo  results for input(s): TROPONINI in the last 168 hours. No results for input(s): TROPIPOC in the last 168 hours.   BNP Recent Labs  Lab 11/04/20 1514 11/07/20 0042 11/08/20 0204  BNP 1,118.0* 1,195.6* 1,061.0*     DDimer No results for input(s): DDIMER in the last 168 hours.   Radiology    CT HEAD WO CONTRAST  Result Date: 11/07/2020 CLINICAL DATA:  Fall with head trauma.  On Eliquis. EXAM: CT HEAD WITHOUT CONTRAST TECHNIQUE: Contiguous axial images were obtained from the base of the skull through the vertex without intravenous contrast. COMPARISON:  None. FINDINGS: The study is mildly motion degraded despite repeat imaging. Brain: There is no evidence of an acute infarct, intracranial hemorrhage, mass, midline shift, or extra-axial fluid collection. The ventricles and sulci are within normal limits for age. Vascular: No hyperdense vessel. Skull: No fracture or suspicious osseous lesion. Sinuses/Orbits: Visualized paranasal sinuses are clear. Small bilateral mastoid effusions. Unremarkable orbits. Other: Possible  mild left frontal scalp soft tissue swelling. IMPRESSION: No evidence of acute intracranial abnormality. Electronically Signed   By: Logan Bores M.D.   On: 11/07/2020 12:34   US Abdomen Limited  Result Date: 11/07/2020 CLINICAL DATA:  Alcohol abuse.  Elevated LFTs. EXAM: ULTRASOUND ABDOMEN LIMITED RIGHT UPPER QUADRANT COMPARISON:  None. FINDINGS: Gallbladder: Partially distended. No gallstones. Mild wall thickening at 3 mm. No sonographic Murphy sign noted by sonographer. Common bile duct: Diameter: 3 mm, normal Liver: No focal lesion identified. Within normal limits in parenchymal echogenicity. There is questionable lobulated contours. Portal vein is patent on color Doppler imaging with normal direction of blood flow towards the liver. Other: No right upper quadrant ascites. IMPRESSION: 1. Questionable micro lobulated hepatic contours which can be seen with cirrhosis. No focal lesion. 2. Upper normal gallbladder wall thickness is likely related to degree of distension. No gallstones or sonographic findings of acute cholecystitis. Electronically Signed   By: Keith Rake M.D.   On: 11/07/2020 19:18   ECHO TEE  Result Date: 11/08/2020    TRANSESOPHOGEAL ECHO REPORT   Patient Name:   CORNELUIS ALLSTON Date of Exam: 11/08/2020 Medical Rec #:  [id number]     Height:       70.0 in Accession #:    [id number]    Weight:       184.1 lb Date of Birth:  April 20, 1961      BSA:          2.015 m Patient Age:    60 years      BP:           95/68 mmHg Patient Gender: M             HR:           105 bpm. Exam Location:  Inpatient Procedure: Transesophageal Echo, 3D Echo and Saline Contrast Bubble Study Indications:    Atrial Septal Defect  History:        Patient has prior history of Echocardiogram examinations, most                 recent 11/05/2020. COPD.  Sonographer:    Bernadene Person RDCS Referring Phys: [phone] MIHAI CROITORU PROCEDURE: After discussion of the risks and benefits of a TEE, an informed consent was obtained from  the patient. The transesophogeal probe was passed without difficulty through the esophogus of the patient. Sedation performed by different physician. The patient was monitored while under deep sedation. Anesthestetic sedation was provided intravenously by Anesthesiology: 290.12mg  of  Propofol, 60mg  of Lidocaine. The patient's vital signs; including heart rate, blood pressure, and oxygen saturation; remained stable throughout the procedure. The patient developed no complications during the procedure. A successful direct current cardioversion was performed at 120 joules with 1 attempt. IMPRESSIONS  1. Left ventricular ejection fraction, by estimation, is 45 to 50%. The left ventricle has mildly decreased function. The left ventricle demonstrates global hypokinesis.  2. Evidence of atrial level shunting detected by color flow Doppler. Agitated saline contrast bubble study was positive with shunting observed within 3-6 cardiac cycles suggestive of interatrial shunt. 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 Diameter of ASD is approximately 0.5cm. There is 1.8 cm landing zone on either side of ASD. Normal coronary sinus/ PV drainage.  3. Right ventricular systolic function is moderately reduced. The right ventricular size is mildly enlarged.  4. Left atrial size was moderately dilated. No left atrial/left atrial appendage thrombus was detected.  5. Right atrial size was severely dilated.  6. The mitral valve is normal in structure. No evidence of mitral valve regurgitation. No evidence of mitral stenosis.  7. The aortic valve is normal in structure. Aortic valve regurgitation is not visualized. No aortic stenosis is present.  8. The inferior vena cava is normal in size with greater than 50% respiratory variability, suggesting right atrial pressure of 3 mmHg.  9. Secundum ASD. FINDINGS  Left Ventricle: Left ventricular ejection fraction, by estimation, is  45 to 50%. The left ventricle has mildly decreased function. The left ventricle demonstrates global hypokinesis. The left ventricular internal cavity size was normal in size. There is  no left ventricular hypertrophy. Right Ventricle: The right ventricular size is mildly enlarged. No increase in right ventricular wall thickness. Right ventricular systolic function is moderately reduced. Left Atrium: Left atrial size was moderately dilated. No left atrial/left atrial appendage thrombus was detected. Right Atrium: Right atrial size was severely dilated. Prominent Eustachian valve. Pericardium: There is no evidence of pericardial effusion. Mitral Valve: The mitral valve is normal in structure. No evidence of mitral valve regurgitation. No evidence of mitral valve stenosis. Tricuspid Valve: The tricuspid valve is normal in structure. Tricuspid valve regurgitation is not demonstrated. No evidence of tricuspid stenosis. Aortic Valve: The aortic valve is normal in structure. Aortic valve regurgitation is not visualized. No aortic stenosis is present. Pulmonic Valve: The pulmonic valve was normal in structure. Pulmonic valve regurgitation is not visualized. No evidence of pulmonic stenosis. Aorta: The aortic root is normal in size and structure. Venous: The inferior vena cava is normal in size with greater than 50% respiratory variability, suggesting right atrial pressure of 3 mmHg. IAS/Shunts: Evidence of atrial level shunting detected by color flow Doppler. Agitated saline contrast was given intravenously to evaluate for intracardiac shunting. Agitated saline contrast bubble study was positive with shunting observed within 3-6 cardiac cycles suggestive of interatrial shunt. 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 Additional Comments: Secundum ASD. Candee Furbish MD Electronically signed by Candee Furbish MD Signature Date/Time: 11/08/2020/3:29:48 PM    Final      Cardiac Studies   Atrial septal defect in systole and diastole  Patient Profile     60 y.o. male COPD and Tranaminitis with with chronic alcohol and tobacco abuse who presented with fatigue, decrease urine output and hyperammonemia  Assessment & Plan    Secundum ASD with associated RV dysfunction - without frank RV  failure - 80 IV lasix; increased today as stable BMP with 60 IV and still needing O2 - peri-discharge will get structural eval  Typical Atrial Flutter Fall without evidence of intracranial bleed - continue new start DOAC - rate controlled on bisoprolol 5 mg - s/p 11/08/20 DC  COPD Tobacco Abuse - has Nicotine Patch - restarting his home albuterol PRN SOB - [facility] assisting; if we cannot wean O2 with diuresis will attempt steroid burst  Skin Rash - contine nystatin cream - appreciate [facility]  Alcohol abuse with prior withdrawal symptoms Tranaminitis with US findings that may suggest cirrhosis (stable and improved LFTs from admission) Constipation Umbilical hernia with prior repair Hyperammonemia with some asterixis Malnutrition with albumin 2.9 - started thiamine - started lactulose - lasix as above - may add spironolactone through course -  Continue CIWA; let patient know that he can ask for help if he is feeling very agitated related to withdrawl  AKI  -  improving with diuresis  DVT Proph- DOAC Labs ordered  Full Code Diet-  cardiac with protein supplements once procedure is finished Orders for transfer to floor placed  Low threshold for PT/OT assessment as withdrawal improves; appear significantly deconditioned (planned for 11/10/20   For questions or updates, please contact [facility] Please consult [email or web address] for contact info under Cardiology/STEMI.      Signed, Werner Lean, MD  11/09/2020, 8:12 AM

## 2020-11-09 NOTE — Progress Notes (Signed)
Consult Progress Note    Jordan Kelly   OHY:[id number]  DOB: [birth year]  DOA: 11/04/2020     5  PCP: Noreene Larsson, NP  Reason for consult: rash, history of alcohol use  Hospital Course: Jordan Kelly is a 60 y.o. male with PMH tobacco and alcohol abuse who initially presented with complaints of constipation, fatigue, and edema on 3/26.  Patient was initially found to be in atrial flutter.  Labs were significant for BUN 46, creatinine 1.28, AST 70, ALT 99, and BNP 1119.   Patient was initially started on Cardizem drip, but was transitioned to bisoprolol and tolerated well.  He has a history of drinking approx 6-pack beer daily. 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  He appears disheveled/unkempt when evaluated.  There were reports of patient having a pruritic rash notably on his right thigh and also a more pronounced rash in his groin region.  Rash was noted to have worsened recently with heat exposure. He also has been constipated and was given a dose of lactulose with good response.  Interval History:  Underwent successful cardioversion yesterday.  Remains in normal sinus rhythm today.  Denies any chest pain, palpitations, shortness of breath.  Mentation remains overall improved; he is still anxious at times. Is amenable to working with physical therapy and agrees that he is deconditioned.  ROS: Constitutional: negative for chills and fevers, Respiratory: negative for cough, Cardiovascular: negative and Gastrointestinal: negative for abdominal pain  Assessment & Plan:  Atrial flutter - s/p TEE DCCV on 3/30; now back to NSR -Continue bisoprolol - management per cardiology   HFrEF - Patient still with some lower extremity edema.  BNP was initially 1119 echocardiogram revealed EF of 45 to 50%.  - Currently being diuresed with Lasix. - management per cardiology/primary team  Contact dermatitis Cutaneous candidiasis - likely heat related/poor hygeine rash noted  on thighs and groin; agree likely combo of candidiasis and contact dermatitis from sweaty/dirty clothes -Continue nystatin cream to groin region -Continue hydrocortisone cream to thighs  Ongoing alcohol abuse Transaminitis Alcoholic cirrhosis without ascites  - does not appear to be in any further w/d; last CIWA scores were minimal - change ativan to q4PRN for anxiety at this point; would not prescribe on discharge; maybe a short course of librium if still felt to be in any w/d but now hospitalized for 4-5 days - continue vitamin supplements - Hepatitis panel negative - RUQ u/s performed on 3/29, early signs of cirrhosis; patient heavily counseled for alcohol cessation  - continue lactulose; dicussed his BM goal bedside (2-3 soft formed BM's daily); asterixis almost fully resolved today   Physical deconditioning -will go ahead and consult PT to start mobilizing patient  COPD Tobacco Use - hold off on steroids, especially with RVR - continue nicotine patch and breathing treatments   Acute kidney injury - baseline creatinine ~ 0.8 - patient presented with increase in creat >0.3 mg/dL above baseline - probable CRS vs arrhythmia mediated -Renal function responding to diuresis -BMP daily  Constipation - improved - continue lactulose   Old records reviewed in assessment of this patient  Antimicrobials: n/a  DVT prophylaxis:  apixaban (ELIQUIS) tablet 5 mg   Code Status:   Code Status: Full Code  Objective: Blood pressure 108/73, pulse 70, temperature 98.9 F (37.2 C), temperature source Oral, resp. rate 18, height 5\' 10"  (1.778 m), weight 80.6 kg, SpO2 94 %.  Examination: General appearance: Awake, alert, resting in bed  in no distress and appears calm and cooperative.  No perceived anxiousness when seen Head: Normocephalic, without obvious abnormality, atraumatic Eyes: Pupils are equal today, 2 mm each and reactive to light equally Lungs: clear to auscultation  bilaterally Heart: regular rate and rhythm and S1, S2 normal Abdomen: Bowel sounds improved.  Nontender.  Obese. Extremities: Lower extremity edema noted Skin: mobility and turgor normal Neurologic: Very minimal asterixis; greatly improved; otherwise no other focal deficits  Data Reviewed: I have personally reviewed following labs and imaging studies Results for orders placed or performed during the hospital encounter of 11/04/20 (from the past 24 hour(s))  Comprehensive metabolic panel     Status: Abnormal   Collection Time: 11/09/20  1:00 AM  Result Value Ref Range   Sodium 141 135 - 145 mmol/L   Potassium 4.3 3.5 - 5.1 mmol/L   Chloride 97 (L) 98 - 111 mmol/L   CO2 39 (H) 22 - 32 mmol/L   Glucose, Bld 99 70 - 99 mg/dL   BUN 22 (H) 6 - 20 mg/dL   Creatinine, Ser 0.83 0.61 - 1.24 mg/dL   Calcium 8.7 (L) 8.9 - 10.3 mg/dL   Total Protein 6.4 (L) 6.5 - 8.1 g/dL   Albumin 2.6 (L) 3.5 - 5.0 g/dL   AST 33 15 - 41 U/L   ALT 46 (H) 0 - 44 U/L   Alkaline Phosphatase 86 38 - 126 U/L   Total Bilirubin 1.0 0.3 - 1.2 mg/dL   GFR, Estimated >60 >60 mL/min   Anion gap 5 5 - 15  Magnesium     Status: None   Collection Time: 11/09/20  1:00 AM  Result Value Ref Range   Magnesium 1.8 1.7 - 2.4 mg/dL  Ammonia     Status: None   Collection Time: 11/09/20  1:00 AM  Result Value Ref Range   Ammonia 32 9 - 35 umol/L    Recent Results (from the past 240 hour(s))  SARS CORONAVIRUS 2 (TAT 6-24 HRS) Nasopharyngeal Nasopharyngeal Swab     Status: None   Collection Time: 11/04/20  3:01 PM   Specimen: Nasopharyngeal Swab  Result Value Ref Range Status   SARS Coronavirus 2 NEGATIVE NEGATIVE Final    Comment: (NOTE) SARS-CoV-2 target nucleic acids are NOT DETECTED.  The SARS-CoV-2 RNA is generally detectable in upper and lower respiratory specimens during the acute phase of infection. Negative results do not preclude SARS-CoV-2 infection, do not rule out co-infections with other pathogens, and  should not be used as the sole basis for treatment or other patient management decisions. Negative results must be combined with clinical observations, patient history, and epidemiological information. The expected result is Negative.  Fact Sheet for Patients: SugarRoll.be  Fact Sheet for Healthcare Providers: https://www.woods-mathews.com/  This test is not yet approved or cleared by the [place] FDA and  has been authorized for detection and/or diagnosis of SARS-CoV-2 by FDA under an Emergency Use Authorization (EUA). This EUA will remain  in effect (meaning this test can be used) for the duration of the COVID-19 declaration under Se ction 564(b)(1) of the Act, 21 U.S.C. section 360bbb-3(b)(1), unless the authorization is terminated or revoked sooner.  Performed at [facility], [place] [address], [place], [place] [address]   Blood culture (routine x 2)     Status: None   Collection Time: 11/04/20  3:44 PM   Specimen: Left Antecubital; Blood  Result Value Ref Range Status   Specimen Description   Final  LEFT ANTECUBITAL BOTTLES DRAWN AEROBIC AND ANAEROBIC   Special Requests Blood Culture adequate volume  Final   Culture   Final    NO GROWTH 5 DAYS Performed at [facility], [address] [place], [place] [address]    Report Status 11/09/2020 FINAL  Final  Blood culture (routine x 2)     Status: None   Collection Time: 11/04/20  3:46 PM   Specimen: BLOOD RIGHT ARM  Result Value Ref Range Status   Specimen Description   Final    BLOOD RIGHT ARM BOTTLES DRAWN AEROBIC AND ANAEROBIC   Special Requests Blood Culture adequate volume  Final   Culture   Final    NO GROWTH 5 DAYS Performed at [facility], [address], [place], [place] [address]    Report Status 11/09/2020 FINAL  Final  MRSA PCR Screening     Status: None   Collection Time: 11/04/20 10:55 PM   Specimen: Nasopharyngeal  Result Value Ref Range Status   MRSA  by PCR NEGATIVE NEGATIVE Final    Comment:        The GeneXpert MRSA Assay (FDA approved for NASAL specimens only), is one component of a comprehensive MRSA colonization surveillance program. It is not intended to diagnose MRSA infection nor to guide or monitor treatment for MRSA infections. Performed at [facility], [place] [address] [place], [place] [address]      Radiology Studies: US Abdomen Limited  Result Date: 11/07/2020 CLINICAL DATA:  Alcohol abuse.  Elevated LFTs. EXAM: ULTRASOUND ABDOMEN LIMITED RIGHT UPPER QUADRANT COMPARISON:  None. FINDINGS: Gallbladder: Partially distended. No gallstones. Mild wall thickening at 3 mm. No sonographic Murphy sign noted by sonographer. Common bile duct: Diameter: 3 mm, normal Liver: No focal lesion identified. Within normal limits in parenchymal echogenicity. There is questionable lobulated contours. Portal vein is patent on color Doppler imaging with normal direction of blood flow towards the liver. Other: No right upper quadrant ascites. IMPRESSION: 1. Questionable micro lobulated hepatic contours which can be seen with cirrhosis. No focal lesion. 2. Upper normal gallbladder wall thickness is likely related to degree of distension. No gallstones or sonographic findings of acute cholecystitis. Electronically Signed   By: Keith Rake M.D.   On: 11/07/2020 19:18   ECHO TEE  Result Date: 11/08/2020    TRANSESOPHOGEAL ECHO REPORT   Patient Name:   AVYAN LIVESAY Date of Exam: 11/08/2020 Medical Rec #:  [id number]     Height:       70.0 in Accession #:    [id number]    Weight:       184.1 lb Date of Birth:  10/07/1960      BSA:          2.015 m Patient Age:    25 years      BP:           95/68 mmHg Patient Gender: M             HR:           105 bpm. Exam Location:  Inpatient Procedure: Transesophageal Echo, 3D Echo and Saline Contrast Bubble Study Indications:    Atrial Septal Defect  History:        Patient has prior history of Echocardiogram  examinations, most                 recent 11/05/2020. COPD.  Sonographer:    Bernadene Person RDCS Referring Phys: [phone] [place] PROCEDURE: After discussion  of the risks and benefits of a TEE, an informed consent was obtained from the patient. The transesophogeal probe was passed without difficulty through the esophogus of the patient. Sedation performed by different physician. The patient was monitored while under deep sedation. Anesthestetic sedation was provided intravenously by Anesthesiology: 290.12mg  of Propofol, 60mg  of Lidocaine. The patient's vital signs; including heart rate, blood pressure, and oxygen saturation; remained stable throughout the procedure. The patient developed no complications during the procedure. A successful direct current cardioversion was performed at 120 joules with 1 attempt. IMPRESSIONS  1. Left ventricular ejection fraction, by estimation, is 45 to 50%. The left ventricle has mildly decreased function. The left ventricle demonstrates global hypokinesis.  2. Evidence of atrial level shunting detected by color flow Doppler. Agitated saline contrast bubble study was positive with shunting observed within 3-6 cardiac cycles suggestive of interatrial shunt. 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 Diameter of ASD is approximately 0.5cm. There is 1.8 cm landing zone on either side of ASD. Normal coronary sinus/ PV drainage.  3. Right ventricular systolic function is moderately reduced. The right ventricular size is mildly enlarged.  4. Left atrial size was moderately dilated. No left atrial/left atrial appendage thrombus was detected.  5. Right atrial size was severely dilated.  6. The mitral valve is normal in structure. No evidence of mitral valve regurgitation. No evidence of mitral stenosis.  7. The aortic valve is normal in structure. Aortic valve regurgitation is not visualized. No aortic stenosis is present.   8. The inferior vena cava is normal in size with greater than 50% respiratory variability, suggesting right atrial pressure of 3 mmHg.  9. Secundum ASD. FINDINGS  Left Ventricle: Left ventricular ejection fraction, by estimation, is 45 to 50%. The left ventricle has mildly decreased function. The left ventricle demonstrates global hypokinesis. The left ventricular internal cavity size was normal in size. There is  no left ventricular hypertrophy. Right Ventricle: The right ventricular size is mildly enlarged. No increase in right ventricular wall thickness. Right ventricular systolic function is moderately reduced. Left Atrium: Left atrial size was moderately dilated. No left atrial/left atrial appendage thrombus was detected. Right Atrium: Right atrial size was severely dilated. Prominent Eustachian valve. Pericardium: There is no evidence of pericardial effusion. Mitral Valve: The mitral valve is normal in structure. No evidence of mitral valve regurgitation. No evidence of mitral valve stenosis. Tricuspid Valve: The tricuspid valve is normal in structure. Tricuspid valve regurgitation is not demonstrated. No evidence of tricuspid stenosis. Aortic Valve: The aortic valve is normal in structure. Aortic valve regurgitation is not visualized. No aortic stenosis is present. Pulmonic Valve: The pulmonic valve was normal in structure. Pulmonic valve regurgitation is not visualized. No evidence of pulmonic stenosis. Aorta: The aortic root is normal in size and structure. Venous: The inferior vena cava is normal in size with greater than 50% respiratory variability, suggesting right atrial pressure of 3 mmHg. IAS/Shunts: Evidence of atrial level shunting detected by color flow Doppler. Agitated saline contrast was given intravenously to evaluate for intracardiac shunting. Agitated saline contrast bubble study was positive with shunting observed within 3-6 cardiac cycles suggestive of interatrial shunt. There is a secundum  atrial septal defect with predominantly right to left shunting across the atrial septum. Additional Comments: Secundum ASD. Candee Furbish MD Electronically signed by Candee Furbish MD Signature Date/Time: 11/08/2020/3:29:48 PM    Final    US Abdomen Limited  Final Result  CT HEAD WO CONTRAST  Final Result    DG Chest Port 1 View  Final Result      Scheduled Meds: . apixaban  5 mg Oral BID  . bisoprolol  5 mg Oral Daily  . Chlorhexidine Gluconate Cloth  6 each Topical Q0600  . feeding supplement  1 Container Oral TID BM  . folic acid  1 mg Oral Daily  . furosemide  80 mg Intravenous Daily  . hydrocortisone cream  1 application Topical BID  . influenza vac split quadrivalent PF  0.5 mL Intramuscular Tomorrow-1000  . lactulose  20 g Oral BID  . mouth rinse  15 mL Mouth Rinse BID  . multivitamin with minerals  1 tablet Oral Daily  . nicotine  21 mg Transdermal Daily  . nystatin cream   Topical BID  . pneumococcal 23 valent vaccine  0.5 mL Intramuscular Tomorrow-1000  . thiamine  100 mg Oral Daily   PRN Meds: acetaminophen, albuterol, LORazepam, magnesium hydroxide, ondansetron (ZOFRAN) IV Continuous Infusions:    LOS: 5 days  Time spent: Greater than 50% of the 35 minute visit was spent in counseling/coordination of care for the patient as laid out in the A&P.   Dwyane Dee, MD [facility] 11/09/2020, 12:59 PM

## 2020-11-10 ENCOUNTER — Inpatient Hospital Stay (HOSPITAL_COMMUNITY): Payer: 59

## 2020-11-10 DIAGNOSIS — F1010 Alcohol abuse, uncomplicated: Secondary | ICD-10-CM | POA: Diagnosis not present

## 2020-11-10 DIAGNOSIS — I483 Typical atrial flutter: Secondary | ICD-10-CM | POA: Diagnosis not present

## 2020-11-10 DIAGNOSIS — I50813 Acute on chronic right heart failure: Secondary | ICD-10-CM | POA: Diagnosis not present

## 2020-11-10 LAB — COMPREHENSIVE METABOLIC PANEL
ALT: 40 U/L (ref 0–44)
AST: 30 U/L (ref 15–41)
Albumin: 2.5 g/dL — ABNORMAL LOW (ref 3.5–5.0)
Alkaline Phosphatase: 83 U/L (ref 38–126)
Anion gap: 5 (ref 5–15)
BUN: 19 mg/dL (ref 6–20)
CO2: 42 mmol/L — ABNORMAL HIGH (ref 22–32)
Calcium: 8.5 mg/dL — ABNORMAL LOW (ref 8.9–10.3)
Chloride: 93 mmol/L — ABNORMAL LOW (ref 98–111)
Creatinine, Ser: 0.71 mg/dL (ref 0.61–1.24)
GFR, Estimated: >60 mL/min (ref >60)
Glucose, Bld: 92 mg/dL (ref 70–99)
Potassium: 3.5 mmol/L (ref 3.5–5.1)
Sodium: 140 mmol/L (ref 135–145)
Total Bilirubin: 1.4 mg/dL — ABNORMAL HIGH (ref 0.3–1.2)
Total Protein: 6 g/dL — ABNORMAL LOW (ref 6.5–8.1)

## 2020-11-10 LAB — PROCALCITONIN: Procalcitonin: <0.1 ng/mL

## 2020-11-10 LAB — MAGNESIUM: Magnesium: 1.5 mg/dL — ABNORMAL LOW (ref 1.7–2.4)

## 2020-11-10 MED ORDER — MAGNESIUM SULFATE 2 GM/50ML IV SOLN
2.0000 g | Freq: Once | INTRAVENOUS | Status: AC
  Administered 2020-11-10: 2 g via INTRAVENOUS
  Filled 2020-11-10: qty 50

## 2020-11-10 NOTE — Plan of Care (Signed)

## 2020-11-10 NOTE — Progress Notes (Signed)
Consult Progress Note    Jordan Kelly   TKW:[id number]  DOB: 11/24/1960  DOA: 11/04/2020     6  PCP: Noreene Larsson, NP  Reason for consult: rash, history of alcohol use  Hospital Course: Jordan Kelly is a 60 y.o. male with PMH tobacco and alcohol abuse who initially presented with complaints of constipation, fatigue, and edema on 3/26.  Patient was initially found to be in atrial flutter.  Labs were significant for BUN 46, creatinine 1.28, AST 70, ALT 99, and BNP 1119.   Patient was initially started on Cardizem drip, but was transitioned to bisoprolol and tolerated well.  He has a history of drinking approx 6-pack beer daily. 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  He appears disheveled/unkempt when evaluated.  There were reports of patient having a pruritic rash notably on his right thigh and also a more pronounced rash in his groin region.  Rash was noted to have worsened recently with heat exposure. He also has been constipated and was given a dose of lactulose with good response. He also underwent TEE DCCV on 11/08/2020.  Interval History:  He is on Venturi mask this morning which is new.  CXR was obtained which shows ongoing volume overload, small right pleural effusion.  He continues on diuresis and an incentive spirometer was ordered and he will be given teaching today as well as ongoing work with PT and being out of bed.  ROS: Constitutional: negative for chills and fevers, Respiratory: negative for cough, Cardiovascular: negative and Gastrointestinal: negative for abdominal pain  Assessment & Plan:  Atrial flutter - s/p TEE DCCV on 3/30; now back to NSR -Continue bisoprolol - management per cardiology   HFrEF - Patient still with some lower extremity edema.  BNP was initially 1119 echocardiogram revealed EF of 45 to 50%.  - Currently being diuresed with Lasix. - management per cardiology/primary team -Venturi mask noted on patient this morning.  CXR  shows ongoing overload, but diuresis continuing  Acute hypoxic respiratory failure -Likely component of underlying volume overload/CHF as well as atelectasis from being in bed for prolonged amount of time -Has had spirometer ordered -Wean oxygen as able -Continue diuresis -Continue working with PT  Contact dermatitis Cutaneous candidiasis - likely heat related/poor hygeine rash noted on thighs and groin; agree likely combo of candidiasis and contact dermatitis from sweaty/dirty clothes -Continue nystatin cream to groin region -Continue hydrocortisone cream to thighs  Ongoing alcohol abuse Transaminitis Alcoholic cirrhosis without ascites  - does not appear to be in any further w/d; last CIWA scores were minimal - change ativan to q4PRN for anxiety at this point; would not prescribe on discharge; maybe a short course of librium if still felt to be in any w/d but now hospitalized for 4-5 days - continue vitamin supplements - Hepatitis panel negative - RUQ u/s performed on 3/29, early signs of cirrhosis; patient heavily counseled for alcohol cessation  - continue lactulose; dicussed his BM goal bedside (2-3 soft formed BM's daily); asterixis almost fully resolved  Physical deconditioning -will go ahead and consult PT to start mobilizing patient  COPD Tobacco Use - hold off on steroids, especially with RVR - continue nicotine patch and breathing treatments   Acute kidney injury - baseline creatinine ~ 0.8 - patient presented with increase in creat >0.3 mg/dL above baseline - probable CRS vs arrhythmia mediated -Renal function responding to diuresis -BMP daily  Constipation - improved - continue lactulose   Old  records reviewed in assessment of this patient  Antimicrobials: n/a  DVT prophylaxis:  apixaban (ELIQUIS) tablet 5 mg   Code Status:   Code Status: Full Code  Objective: Blood pressure 114/79, pulse 65, temperature 97.7 F (36.5 C), temperature source Oral,  resp. rate 19, height 5\' 10"  (1.778 m), weight 80.6 kg, SpO2 97 %.  Examination: General appearance: Awake, alert, resting in bed in no distress and appears calm and cooperative.  No perceived anxiousness when seen Head: Normocephalic, without obvious abnormality, atraumatic Eyes: Pupils are equal today, 2 mm each and reactive to light equally Lungs: clear to auscultation bilaterally Heart: regular rate and rhythm and S1, S2 normal Abdomen: Bowel sounds improved.  Nontender.  Obese. Extremities: Lower extremity edema noted Skin: mobility and turgor normal Neurologic: overall improved; no focal deficits   Data Reviewed: I have personally reviewed following labs and imaging studies Results for orders placed or performed during the hospital encounter of 11/04/20 (from the past 24 hour(s))  Comprehensive metabolic panel     Status: Abnormal   Collection Time: 11/10/20  3:43 AM  Result Value Ref Range   Sodium 140 135 - 145 mmol/L   Potassium 3.5 3.5 - 5.1 mmol/L   Chloride 93 (L) 98 - 111 mmol/L   CO2 42 (H) 22 - 32 mmol/L   Glucose, Bld 92 70 - 99 mg/dL   BUN 19 6 - 20 mg/dL   Creatinine, Ser 0.71 0.61 - 1.24 mg/dL   Calcium 8.5 (L) 8.9 - 10.3 mg/dL   Total Protein 6.0 (L) 6.5 - 8.1 g/dL   Albumin 2.5 (L) 3.5 - 5.0 g/dL   AST 30 15 - 41 U/L   ALT 40 0 - 44 U/L   Alkaline Phosphatase 83 38 - 126 U/L   Total Bilirubin 1.4 (H) 0.3 - 1.2 mg/dL   GFR, Estimated >60 >60 mL/min   Anion gap 5 5 - 15  Magnesium     Status: Abnormal   Collection Time: 11/10/20  3:43 AM  Result Value Ref Range   Magnesium 1.5 (L) 1.7 - 2.4 mg/dL  Procalcitonin - Baseline     Status: None   Collection Time: 11/10/20  3:43 AM  Result Value Ref Range   Procalcitonin <0.10 ng/mL    Recent Results (from the past 240 hour(s))  SARS CORONAVIRUS 2 (TAT 6-24 HRS) Nasopharyngeal Nasopharyngeal Swab     Status: None   Collection Time: 11/04/20  3:01 PM   Specimen: Nasopharyngeal Swab  Result Value Ref Range  Status   SARS Coronavirus 2 NEGATIVE NEGATIVE Final    Comment: (NOTE) SARS-CoV-2 target nucleic acids are NOT DETECTED.  The SARS-CoV-2 RNA is generally detectable in upper and lower respiratory specimens during the acute phase of infection. Negative results do not preclude SARS-CoV-2 infection, do not rule out co-infections with other pathogens, and should not be used as the sole basis for treatment or other patient management decisions. Negative results must be combined with clinical observations, patient history, and epidemiological information. The expected result is Negative.  Fact Sheet for Patients: SugarRoll.be  Fact Sheet for Healthcare Providers: https://www.woods-mathews.com/  This test is not yet approved or cleared by the [place] FDA and  has been authorized for detection and/or diagnosis of SARS-CoV-2 by FDA under an Emergency Use Authorization (EUA). This EUA will remain  in effect (meaning this test can be used) for the duration of the COVID-19 declaration under Se ction 564(b)(1) of the Act, 21 U.S.C. section 360bbb-3(b)(1), unless  the authorization is terminated or revoked sooner.  Performed at [facility], [place] [address] [place], [place] [address]   Blood culture (routine x 2)     Status: None   Collection Time: 11/04/20  3:44 PM   Specimen: Left Antecubital; Blood  Result Value Ref Range Status   Specimen Description   Final    LEFT ANTECUBITAL BOTTLES DRAWN AEROBIC AND ANAEROBIC   Special Requests Blood Culture adequate volume  Final   Culture   Final    NO GROWTH 5 DAYS Performed at [facility], [address], [place], [place] [address]    Report Status 11/09/2020 FINAL  Final  Blood culture (routine x 2)     Status: None   Collection Time: 11/04/20  3:46 PM   Specimen: BLOOD RIGHT ARM  Result Value Ref Range Status   Specimen Description   Final    BLOOD RIGHT ARM BOTTLES DRAWN AEROBIC AND  ANAEROBIC   Special Requests Blood Culture adequate volume  Final   Culture   Final    NO GROWTH 5 DAYS Performed at [facility], [address], [place], [place] [address]    Report Status 11/09/2020 FINAL  Final  MRSA PCR Screening     Status: None   Collection Time: 11/04/20 10:55 PM   Specimen: Nasopharyngeal  Result Value Ref Range Status   MRSA by PCR NEGATIVE NEGATIVE Final    Comment:        The GeneXpert MRSA Assay (FDA approved for NASAL specimens only), is one component of a comprehensive MRSA colonization surveillance program. It is not intended to diagnose MRSA infection nor to guide or monitor treatment for MRSA infections. Performed at [facility], [place] [address], [place],  [address]      Radiology Studies: DG CHEST PORT 1 VIEW  Result Date: 11/10/2020 CLINICAL DATA:  Shortness of breath Respiratory failure with hypoxia EXAM: PORTABLE CHEST 1 VIEW COMPARISON:  01/04/2021 FINDINGS: Mild cardiomegaly. Unchanged small right pleural effusion. Mild pulmonary vascular congestion. Focal airspace opacity in the right mid lung suspicious for pneumonia. IMPRESSION: Radiographic findings most consistent with CHF/fluid volume overload with small right pleural effusion. There is more focal airspace opacity at the right lung base which could represent superimposed pneumonia. Electronically Signed   By: Miachel Roux M.D.   On: 11/10/2020 08:05   ECHO TEE  Result Date: 11/08/2020    TRANSESOPHOGEAL ECHO REPORT   Patient Name:   AIKEEM LILLEY Date of Exam: 11/08/2020 Medical Rec #:  [id number]     Height:       70.0 in Accession #:    [id number]    Weight:       184.1 lb Date of Birth:  05-18-1961      BSA:          2.015 m Patient Age:    59 years      BP:           95/68 mmHg Patient Gender: M             HR:           105 bpm. Exam Location:  Inpatient Procedure: Transesophageal Echo, 3D Echo and Saline Contrast Bubble Study Indications:    Atrial Septal Defect  History:         Patient has prior history of Echocardiogram examinations, most                 recent 11/05/2020.  COPD.  Sonographer:    Bernadene Person RDCS Referring Phys: [phone] MIHAI CROITORU PROCEDURE: After discussion of the risks and benefits of a TEE, an informed consent was obtained from the patient. The transesophogeal probe was passed without difficulty through the esophogus of the patient. Sedation performed by different physician. The patient was monitored while under deep sedation. Anesthestetic sedation was provided intravenously by Anesthesiology: 290.12mg  of Propofol, 60mg  of Lidocaine. The patient's vital signs; including heart rate, blood pressure, and oxygen saturation; remained stable throughout the procedure. The patient developed no complications during the procedure. A successful direct current cardioversion was performed at 120 joules with 1 attempt. IMPRESSIONS  1. Left ventricular ejection fraction, by estimation, is 45 to 50%. The left ventricle has mildly decreased function. The left ventricle demonstrates global hypokinesis.  2. Evidence of atrial level shunting detected by color flow Doppler. Agitated saline contrast bubble study was positive with shunting observed within 3-6 cardiac cycles suggestive of interatrial shunt. 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 Diameter of ASD is approximately 0.5cm. There is 1.8 cm landing zone on either side of ASD. Normal coronary sinus/ PV drainage.  3. Right ventricular systolic function is moderately reduced. The right ventricular size is mildly enlarged.  4. Left atrial size was moderately dilated. No left atrial/left atrial appendage thrombus was detected.  5. Right atrial size was severely dilated.  6. The mitral valve is normal in structure. No evidence of mitral valve regurgitation. No evidence of mitral stenosis.  7. The aortic valve is normal in structure. Aortic valve regurgitation is  not visualized. No aortic stenosis is present.  8. The inferior vena cava is normal in size with greater than 50% respiratory variability, suggesting right atrial pressure of 3 mmHg.  9. Secundum ASD. FINDINGS  Left Ventricle: Left ventricular ejection fraction, by estimation, is 45 to 50%. The left ventricle has mildly decreased function. The left ventricle demonstrates global hypokinesis. The left ventricular internal cavity size was normal in size. There is  no left ventricular hypertrophy. Right Ventricle: The right ventricular size is mildly enlarged. No increase in right ventricular wall thickness. Right ventricular systolic function is moderately reduced. Left Atrium: Left atrial size was moderately dilated. No left atrial/left atrial appendage thrombus was detected. Right Atrium: Right atrial size was severely dilated. Prominent Eustachian valve. Pericardium: There is no evidence of pericardial effusion. Mitral Valve: The mitral valve is normal in structure. No evidence of mitral valve regurgitation. No evidence of mitral valve stenosis. Tricuspid Valve: The tricuspid valve is normal in structure. Tricuspid valve regurgitation is not demonstrated. No evidence of tricuspid stenosis. Aortic Valve: The aortic valve is normal in structure. Aortic valve regurgitation is not visualized. No aortic stenosis is present. Pulmonic Valve: The pulmonic valve was normal in structure. Pulmonic valve regurgitation is not visualized. No evidence of pulmonic stenosis. Aorta: The aortic root is normal in size and structure. Venous: The inferior vena cava is normal in size with greater than 50% respiratory variability, suggesting right atrial pressure of 3 mmHg. IAS/Shunts: Evidence of atrial level shunting detected by color flow Doppler. Agitated saline contrast was given intravenously to evaluate for intracardiac shunting. Agitated saline contrast bubble study was positive with shunting observed within 3-6 cardiac cycles  suggestive of interatrial shunt. 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 Additional Comments: Secundum ASD. Candee Furbish MD Electronically signed by Candee Furbish MD Signature  Date/Time: 11/08/2020/3:29:48 PM    Final    DG CHEST PORT 1 VIEW  Final Result    US Abdomen Limited  Final Result    CT HEAD WO CONTRAST  Final Result    DG Chest Port 1 View  Final Result      Scheduled Meds: . apixaban  5 mg Oral BID  . bisoprolol  5 mg Oral Daily  . Chlorhexidine Gluconate Cloth  6 each Topical Q0600  . feeding supplement  1 Container Oral TID BM  . folic acid  1 mg Oral Daily  . furosemide  80 mg Intravenous Daily  . hydrocortisone cream  1 application Topical BID  . influenza vac split quadrivalent PF  0.5 mL Intramuscular Tomorrow-1000  . lactulose  20 g Oral BID  . mouth rinse  15 mL Mouth Rinse BID  . multivitamin with minerals  1 tablet Oral Daily  . nicotine  21 mg Transdermal Daily  . nystatin cream   Topical BID  . pneumococcal 23 valent vaccine  0.5 mL Intramuscular Tomorrow-1000  . thiamine  100 mg Oral Daily   PRN Meds: acetaminophen, albuterol, LORazepam, magnesium hydroxide, ondansetron (ZOFRAN) IV Continuous Infusions:    LOS: 6 days  Time spent: Greater than 50% of the 35 minute visit was spent in counseling/coordination of care for the patient as laid out in the A&P.   Dwyane Dee, MD [facility] 11/10/2020, 12:32 PM

## 2020-11-10 NOTE — Progress Notes (Signed)
Progress Note  Patient Name: Jordan Kelly Date of Encounter: 11/10/2020  Primary Cardiologist: No primary care provider on file.   Subjective   In interval transferred from ICU. Up with PT and ativan orders decreased; indication for anxiety.Patient is eager to be more independent.  Inpatient Medications    Scheduled Meds: . apixaban  5 mg Oral BID  . bisoprolol  5 mg Oral Daily  . Chlorhexidine Gluconate Cloth  6 each Topical Q0600  . feeding supplement  1 Container Oral TID BM  . folic acid  1 mg Oral Daily  . furosemide  80 mg Intravenous Daily  . hydrocortisone cream  1 application Topical BID  . influenza vac split quadrivalent PF  0.5 mL Intramuscular Tomorrow-1000  . lactulose  20 g Oral BID  . mouth rinse  15 mL Mouth Rinse BID  . multivitamin with minerals  1 tablet Oral Daily  . nicotine  21 mg Transdermal Daily  . nystatin cream   Topical BID  . pneumococcal 23 valent vaccine  0.5 mL Intramuscular Tomorrow-1000  . thiamine  100 mg Oral Daily   Continuous Infusions:  PRN Meds: acetaminophen, albuterol, LORazepam, magnesium hydroxide, ondansetron (ZOFRAN) IV   Vital Signs    Vitals:   11/09/20 2029 11/09/20 2309 11/10/20 0518 11/10/20 0814  BP: 115/76 118/77 103/63 112/71  Pulse: (!) 57 77 71 72  Resp: 16 16 16 18   Temp: 98.5 F (36.9 C) 98.3 F (36.8 C) 98.1 F (36.7 C) 97.8 F (36.6 C)  TempSrc: Oral Oral Oral Oral  SpO2: 90% 97% 97% 97%  Weight:   80.6 kg   Height:        Intake/Output Summary (Last 24 hours) at 11/10/2020 0941 Last data filed at 11/10/2020 0924 Gross per 24 hour  Intake 958 ml  Output 1780 ml  Net -822 ml   Filed Weights   11/08/20 1118 11/09/20 0600 11/10/20 0518  Weight: 83.5 kg 80.6 kg 80.6 kg    Telemetry    SR with PACs - Personally Reviewed  ECG    No new - Personally Reviewed  Physical Exam   GEN: No acute distress.   Neck: No JVD Cardiac: regular rate and rhythm, II/VI holosystolic murmur,no rubs, or  gallops.  Respiratory: Wheezes in all lung fields GI: Soft, with non tender non distended; umbilical hernia noted MS: +1 edema; No deformity.  Neuro:  Nonfocal, no asterixis Psych: Normal affect  Skin:  Red macular rash in intertriginous zone   Labs    Chemistry Recent Labs  Lab 11/08/20 0204 11/09/20 0100 11/10/20 0343  NA 139 141 140  K 4.4 4.3 3.5  CL 97* 97* 93*  CO2 33* 39* 42*  GLUCOSE 100* 99 92  BUN 28* 22* 19  CREATININE 0.83 0.83 0.71  CALCIUM 8.9 8.7* 8.5*  PROT 6.2* 6.4* 6.0*  ALBUMIN 2.5* 2.6* 2.5*  AST 34 33 30  ALT 47* 46* 40  ALKPHOS 93 86 83  BILITOT 0.8 1.0 1.4*  GFRNONAA >60 >60 >60  ANIONGAP 9 5 5      Hematology Recent Labs  Lab 11/04/20 1514 11/04/20 1540 11/05/20 1041 11/07/20 0042  WBC 8.5  --  9.3 8.7  RBC 4.66  --  4.77 4.68  HGB 15.3 16.7 15.7 15.1  HCT 49.2 49.0 50.0 49.5  MCV 105.6*  --  104.8* 105.8*  MCH 32.8  --  32.9 32.3  MCHC 31.1  --  31.4 30.5  RDW 13.4  --  13.5 13.6  PLT 306  --  276 295    Cardiac EnzymesNo results for input(s): TROPONINI in the last 168 hours. No results for input(s): TROPIPOC in the last 168 hours.   BNP Recent Labs  Lab 11/04/20 1514 11/07/20 0042 11/08/20 0204  BNP 1,118.0* 1,195.6* 1,061.0*     DDimer No results for input(s): DDIMER in the last 168 hours.   Radiology    DG CHEST PORT 1 VIEW  Result Date: 11/10/2020 CLINICAL DATA:  Shortness of breath Respiratory failure with hypoxia EXAM: PORTABLE CHEST 1 VIEW COMPARISON:  01/04/2021 FINDINGS: Mild cardiomegaly. Unchanged small right pleural effusion. Mild pulmonary vascular congestion. Focal airspace opacity in the right mid lung suspicious for pneumonia. IMPRESSION: Radiographic findings most consistent with CHF/fluid volume overload with small right pleural effusion. There is more focal airspace opacity at the right lung base which could represent superimposed pneumonia. Electronically Signed   By: Miachel Roux M.D.   On: 11/10/2020  08:05   ECHO TEE  Result Date: 11/08/2020    TRANSESOPHOGEAL ECHO REPORT   Patient Name:   Jordan Kelly Date of Exam: 11/08/2020 Medical Rec #:  [id number]     Height:       70.0 in Accession #:    [id number]    Weight:       184.1 lb Date of Birth:  May 20, 1961      BSA:          2.015 m Patient Age:    60 years      BP:           95/68 mmHg Patient Gender: M             HR:           105 bpm. Exam Location:  Inpatient Procedure: Transesophageal Echo, 3D Echo and Saline Contrast Bubble Study Indications:    Atrial Septal Defect  History:        Patient has prior history of Echocardiogram examinations, most                 recent 11/05/2020. COPD.  Sonographer:    Bernadene Person RDCS Referring Phys: [phone] MIHAI CROITORU PROCEDURE: After discussion of the risks and benefits of a TEE, an informed consent was obtained from the patient. The transesophogeal probe was passed without difficulty through the esophogus of the patient. Sedation performed by different physician. The patient was monitored while under deep sedation. Anesthestetic sedation was provided intravenously by Anesthesiology: 290.12mg  of Propofol, 60mg  of Lidocaine. The patient's vital signs; including heart rate, blood pressure, and oxygen saturation; remained stable throughout the procedure. The patient developed no complications during the procedure. A successful direct current cardioversion was performed at 120 joules with 1 attempt. IMPRESSIONS  1. Left ventricular ejection fraction, by estimation, is 45 to 50%. The left ventricle has mildly decreased function. The left ventricle demonstrates global hypokinesis.  2. Evidence of atrial level shunting detected by color flow Doppler. Agitated saline contrast bubble study was positive with shunting observed within 3-6 cardiac cycles suggestive of interatrial shunt. 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 Diameter of ASD is approximately  0.5cm. There is 1.8 cm landing zone on either side of ASD. Normal coronary sinus/ PV drainage.  3. Right ventricular systolic function is moderately reduced. The right ventricular size is mildly enlarged.  4. Left atrial size was moderately dilated. No left atrial/left atrial appendage thrombus was detected.  5. Right atrial size was severely dilated.  6. The mitral valve is normal in structure. No evidence of mitral valve regurgitation. No evidence of mitral stenosis.  7. The aortic valve is normal in structure. Aortic valve regurgitation is not visualized. No aortic stenosis is present.  8. The inferior vena cava is normal in size with greater than 50% respiratory variability, suggesting right atrial pressure of 3 mmHg.  9. Secundum ASD. FINDINGS  Left Ventricle: Left ventricular ejection fraction, by estimation, is 45 to 50%. The left ventricle has mildly decreased function. The left ventricle demonstrates global hypokinesis. The left ventricular internal cavity size was normal in size. There is  no left ventricular hypertrophy. Right Ventricle: The right ventricular size is mildly enlarged. No increase in right ventricular wall thickness. Right ventricular systolic function is moderately reduced. Left Atrium: Left atrial size was moderately dilated. No left atrial/left atrial appendage thrombus was detected. Right Atrium: Right atrial size was severely dilated. Prominent Eustachian valve. Pericardium: There is no evidence of pericardial effusion. Mitral Valve: The mitral valve is normal in structure. No evidence of mitral valve regurgitation. No evidence of mitral valve stenosis. Tricuspid Valve: The tricuspid valve is normal in structure. Tricuspid valve regurgitation is not demonstrated. No evidence of tricuspid stenosis. Aortic Valve: The aortic valve is normal in structure. Aortic valve regurgitation is not visualized. No aortic stenosis is present. Pulmonic Valve: The pulmonic valve was normal in structure.  Pulmonic valve regurgitation is not visualized. No evidence of pulmonic stenosis. Aorta: The aortic root is normal in size and structure. Venous: The inferior vena cava is normal in size with greater than 50% respiratory variability, suggesting right atrial pressure of 3 mmHg. IAS/Shunts: Evidence of atrial level shunting detected by color flow Doppler. Agitated saline contrast was given intravenously to evaluate for intracardiac shunting. Agitated saline contrast bubble study was positive with shunting observed within 3-6 cardiac cycles suggestive of interatrial shunt. 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 Additional Comments: Secundum ASD. Candee Furbish MD Electronically signed by Candee Furbish MD Signature Date/Time: 11/08/2020/3:29:48 PM    Final     Cardiac Studies   Atrial septal defect in systole and diastole  CXR cannot rule out superimposed PNA;   Patient Profile     60 y.o. male COPD and Tranaminitis with with chronic alcohol and tobacco abuse who presented with fatigue, decrease urine output and hyperammonemia  Assessment & Plan    Secundum ASD with associated RV dysfunction - without frank RV failure - 80 IV lasix with no increase in his creatinine or BUN; still requiring O2; will continue - peri-discharge will get structural eval; will reach out to team today  Typical Atrial Flutter Fall without evidence of intracranial bleed - continue new start DOAC - rate controlled on bisoprolol 5 mg - s/p 11/08/20 DC  COPD Tobacco Abuse - has Nicotine Patch - restarting his home albuterol PRN SOB - [facility] assisting; if we cannot wean O2 with diuresis will attempt steroid burst - starting IS - clinically less consistent with superimposed PNA, but will defer to above for ABX start  Alcohol abuse with prior withdrawal symptoms Tranaminitis with US findings that may suggest cirrhosis (stable and improved LFTs  from admission) Constipation Umbilical hernia with prior repair Hyperammonemia with some asterixis Malnutrition with albumin 2.9 - started thiamine - started lactulose - lasix as above - may add spironolactone through course - [facility] team assisting; benzos now for  anxiety component largely  AKI  -  improving with diuresis  Skin Rash - contine nystatin cream - appreciate [facility] Recs  DVT Proph- DOAC Labs ordered  Full Code Diet-  cardiac with protein supplements once procedure is finished Likely home with home health; will follow PT/OT recs Discussed at length with daughter per patients request [facility])    For questions or updates, please contact [place] Please consult [email or web address] for contact info under Cardiology/STEMI.      Signed, Werner Lean, MD  11/10/2020, 9:41 AM

## 2020-11-11 DIAGNOSIS — R21 Rash and other nonspecific skin eruption: Secondary | ICD-10-CM

## 2020-11-11 DIAGNOSIS — E876 Hypokalemia: Secondary | ICD-10-CM

## 2020-11-11 DIAGNOSIS — I50813 Acute on chronic right heart failure: Secondary | ICD-10-CM | POA: Diagnosis not present

## 2020-11-11 DIAGNOSIS — I483 Typical atrial flutter: Secondary | ICD-10-CM | POA: Diagnosis not present

## 2020-11-11 DIAGNOSIS — F1010 Alcohol abuse, uncomplicated: Secondary | ICD-10-CM | POA: Diagnosis not present

## 2020-11-11 DIAGNOSIS — I4892 Unspecified atrial flutter: Secondary | ICD-10-CM | POA: Diagnosis not present

## 2020-11-11 DIAGNOSIS — J449 Chronic obstructive pulmonary disease, unspecified: Secondary | ICD-10-CM | POA: Diagnosis not present

## 2020-11-11 LAB — CBC WITH DIFFERENTIAL/PLATELET
Abs Immature Granulocytes: 0.03 10*3/uL (ref 0.00–0.07)
Basophils Absolute: 0.1 10*3/uL (ref 0.0–0.1)
Basophils Relative: 1 %
Eosinophils Absolute: 0.3 10*3/uL (ref 0.0–0.5)
Eosinophils Relative: 3 %
HCT: 49.1 % (ref 39.0–52.0)
Hemoglobin: 15.6 g/dL (ref 13.0–17.0)
Immature Granulocytes: 0 %
Lymphocytes Relative: 19 %
Lymphs Abs: 1.7 10*3/uL (ref 0.7–4.0)
MCH: 31.9 pg (ref 26.0–34.0)
MCHC: 31.8 g/dL (ref 30.0–36.0)
MCV: 100.4 fL — ABNORMAL HIGH (ref 80.0–100.0)
Monocytes Absolute: 0.9 10*3/uL (ref 0.1–1.0)
Monocytes Relative: 11 %
Neutro Abs: 5.8 10*3/uL (ref 1.7–7.7)
Neutrophils Relative %: 66 %
Platelets: 301 10*3/uL (ref 150–400)
RBC: 4.89 MIL/uL (ref 4.22–5.81)
RDW: 13.2 % (ref 11.5–15.5)
WBC: 8.8 10*3/uL (ref 4.0–10.5)
nRBC: 0 % (ref 0.0–0.2)

## 2020-11-11 LAB — COMPREHENSIVE METABOLIC PANEL
ALT: 38 U/L (ref 0–44)
AST: 29 U/L (ref 15–41)
Albumin: 2.5 g/dL — ABNORMAL LOW (ref 3.5–5.0)
Alkaline Phosphatase: 83 U/L (ref 38–126)
Anion gap: 7 (ref 5–15)
BUN: 15 mg/dL (ref 6–20)
CO2: 42 mmol/L — ABNORMAL HIGH (ref 22–32)
Calcium: 8.4 mg/dL — ABNORMAL LOW (ref 8.9–10.3)
Chloride: 88 mmol/L — ABNORMAL LOW (ref 98–111)
Creatinine, Ser: 0.8 mg/dL (ref 0.61–1.24)
GFR, Estimated: >60 mL/min (ref >60)
Glucose, Bld: 105 mg/dL — ABNORMAL HIGH (ref 70–99)
Potassium: 3.1 mmol/L — ABNORMAL LOW (ref 3.5–5.1)
Sodium: 137 mmol/L (ref 135–145)
Total Bilirubin: 1.7 mg/dL — ABNORMAL HIGH (ref 0.3–1.2)
Total Protein: 6.3 g/dL — ABNORMAL LOW (ref 6.5–8.1)

## 2020-11-11 LAB — PHOSPHORUS: Phosphorus: 4.2 mg/dL (ref 2.5–4.6)

## 2020-11-11 LAB — MAGNESIUM: Magnesium: 1.7 mg/dL (ref 1.7–2.4)

## 2020-11-11 LAB — PROCALCITONIN: Procalcitonin: <0.1 ng/mL

## 2020-11-11 MED ORDER — MAGNESIUM SULFATE 2 GM/50ML IV SOLN
2.0000 g | Freq: Once | INTRAVENOUS | Status: AC
  Administered 2020-11-11: 2 g via INTRAVENOUS
  Filled 2020-11-11: qty 50

## 2020-11-11 MED ORDER — POTASSIUM CHLORIDE CRYS ER 20 MEQ PO TBCR
40.0000 meq | EXTENDED_RELEASE_TABLET | Freq: Once | ORAL | Status: AC
  Administered 2020-11-11: 40 meq via ORAL
  Filled 2020-11-11: qty 2

## 2020-11-11 MED ORDER — MAGNESIUM OXIDE 400 (241.3 MG) MG PO TABS: 800.0000 mg | ORAL_TABLET | Freq: Once | ORAL | Status: DC

## 2020-11-11 MED ORDER — MAGNESIUM SULFATE 2 GM/50ML IV SOLN: 2.0000 g | Freq: Once | INTRAVENOUS | Status: DC

## 2020-11-11 MED ORDER — POTASSIUM CHLORIDE 20 MEQ PO PACK: 80.0000 meq | PACK | Freq: Once | ORAL | Status: DC

## 2020-11-11 NOTE — Progress Notes (Signed)
Progress Note  Patient Name: Jordan Kelly Date of Encounter: 11/11/2020  Primary Cardiologist: No primary care provider on file.   Subjective   No change overnight.  Low Potassium.  Able to walk and be more active.  Inpatient Medications    Scheduled Meds: . apixaban  5 mg Oral BID  . bisoprolol  5 mg Oral Daily  . Chlorhexidine Gluconate Cloth  6 each Topical Q0600  . feeding supplement  1 Container Oral TID BM  . folic acid  1 mg Oral Daily  . furosemide  80 mg Intravenous Daily  . hydrocortisone cream  1 application Topical BID  . influenza vac split quadrivalent PF  0.5 mL Intramuscular Tomorrow-1000  . lactulose  20 g Oral BID  . mouth rinse  15 mL Mouth Rinse BID  . multivitamin with minerals  1 tablet Oral Daily  . nicotine  21 mg Transdermal Daily  . nystatin cream   Topical BID  . pneumococcal 23 valent vaccine  0.5 mL Intramuscular Tomorrow-1000  . thiamine  100 mg Oral Daily   Continuous Infusions:  PRN Meds: acetaminophen, albuterol, LORazepam, magnesium hydroxide, ondansetron (ZOFRAN) IV   Vital Signs    Vitals:   11/10/20 1208 11/10/20 1627 11/10/20 1934 11/11/20 0403  BP: 114/79 109/74 121/75 104/74  Pulse: 65 68 75 66  Resp: 19 18 18 16   Temp: 97.7 F (36.5 C) 98.3 F (36.8 C) 97.8 F (36.6 C) (!) 97.4 F (36.3 C)  TempSrc: Oral Oral Oral Oral  SpO2: 97% 97% 94% 97%  Weight:    78.9 kg  Height:        Intake/Output Summary (Last 24 hours) at 11/11/2020 1201 Last data filed at 11/10/2020 1847 Gross per 24 hour  Intake 360 ml  Output --  Net 360 ml   Filed Weights   11/09/20 0600 11/10/20 0518 11/11/20 0403  Weight: 80.6 kg 80.6 kg 78.9 kg    Telemetry    SR with PACs - Personally Reviewed  ECG    No new - Personally Reviewed  Physical Exam   GEN: No acute distress.   Neck: No JVD Cardiac: regular rate and rhythm, II/VI holosystolic murmur,no rubs, or gallops.  Respiratory:  Wheezes in lung bases no crackles GI: Soft, with  non tender non distended; umbilical hernia noted MS: +1 edema; No deformity.  Warm Neuro:  Nonfocal, no asterixis Psych: Normal affect  Skin:  Red macular rash in intertriginous zone   Labs    Chemistry Recent Labs  Lab 11/09/20 0100 11/10/20 0343 11/11/20 0433  NA 141 140 137  K 4.3 3.5 3.1*  CL 97* 93* 88*  CO2 39* 42* 42*  GLUCOSE 99 92 105*  BUN 22* 19 15  CREATININE 0.83 0.71 0.80  CALCIUM 8.7* 8.5* 8.4*  PROT 6.4* 6.0* 6.3*  ALBUMIN 2.6* 2.5* 2.5*  AST 33 30 29  ALT 46* 40 38  ALKPHOS 86 83 83  BILITOT 1.0 1.4* 1.7*  GFRNONAA >60 >60 >60  ANIONGAP 5 5 7      Hematology Recent Labs  Lab 11/05/20 1041 11/07/20 0042 11/11/20 0433  WBC 9.3 8.7 8.8  RBC 4.77 4.68 4.89  HGB 15.7 15.1 15.6  HCT 50.0 49.5 49.1  MCV 104.8* 105.8* 100.4*  MCH 32.9 32.3 31.9  MCHC 31.4 30.5 31.8  RDW 13.5 13.6 13.2  PLT 276 295 301    Cardiac EnzymesNo results for input(s): TROPONINI in the last 168 hours. No results for input(s): TROPIPOC  in the last 168 hours.   BNP Recent Labs  Lab 11/04/20 1514 11/07/20 0042 11/08/20 0204  BNP 1,118.0* 1,195.6* 1,061.0*     DDimer No results for input(s): DDIMER in the last 168 hours.   Radiology    DG CHEST PORT 1 VIEW  Result Date: 11/10/2020 CLINICAL DATA:  Shortness of breath Respiratory failure with hypoxia EXAM: PORTABLE CHEST 1 VIEW COMPARISON:  01/04/2021 FINDINGS: Mild cardiomegaly. Unchanged small right pleural effusion. Mild pulmonary vascular congestion. Focal airspace opacity in the right mid lung suspicious for pneumonia. IMPRESSION: Radiographic findings most consistent with CHF/fluid volume overload with small right pleural effusion. There is more focal airspace opacity at the right lung base which could represent superimposed pneumonia. Electronically Signed   By: Miachel Roux M.D.   On: 11/10/2020 08:05    Cardiac Studies   Atrial septal defect in systole and diastole  CXR cannot rule out superimposed PNA;    Patient Profile     60 y.o. male COPD and Tranaminitis with with chronic alcohol and tobacco abuse who presented with fatigue, decrease urine output and hyperammonemia  Assessment & Plan    Secundum ASD with associated RV dysfunction Hypokalemia Hypomagnesemia - without frank RV failure - 80 IV lasix with no increase in his creatinine or BUN; still requiring O2; will continue; electrolyte catch up is the reason we are not more aggressive - pending outpatient f/u - has lost ~ 13 lbs+ since admission  Typical Atrial Flutter Fall without evidence of intracranial bleed - continue new start DOAC - rate controlled on bisoprolol 5 mg - s/p 11/08/20 DC  COPD Tobacco Abuse - has Nicotine Patch - restarting his home albuterol PRN SOB - [facility] assisting; if we cannot wean O2 with diuresis will attempt steroid burst - Continue IS  Alcohol abuse with prior withdrawal symptoms Tranaminitis with US findings that may suggest cirrhosis (stable and improved LFTs from admission) Constipation Umbilical hernia with prior repair Hyperammonemia with some asterixis Malnutrition with albumin 2.9 - continue thiamine - Continue lactulose - lasix as above - Likely will not need to add aldactone through course (have been looking to do so this week) - [facility] team assisting; benzos now for anxiety component largely  AKI  -  improving with diuresis  Skin Rash - contine nystatin cream - appreciate [facility] Recs  DVT Proph- DOAC Labs ordered  Full Code Diet-  cardiac with protein supplements once procedure is finished Likely home with home health; will follow PT/OT recs   For questions or updates, please contact [facility] Please consult [email or web address] for contact info under Cardiology/STEMI.      Signed, Werner Lean, MD  11/11/2020, 12:01 PM

## 2020-11-11 NOTE — Progress Notes (Signed)
Consult Progress Note    Jordan Kelly   KXF:[id number]  DOB: 06-21-1961  DOA: 11/04/2020     7  PCP: Noreene Larsson, NP  Reason for consult: rash, history of alcohol use  Hospital Course: Jordan Kelly is a 60 y.o. male with PMH tobacco and alcohol abuse who initially presented with complaints of constipation, fatigue, and edema on 3/26.  Patient was initially found to be in atrial flutter.  Labs were significant for BUN 46, creatinine 1.28, AST 70, ALT 99, and BNP 1119.   Patient was initially started on Cardizem drip, but was transitioned to bisoprolol and tolerated well.  He has a history of drinking approx 6-pack beer daily. 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  He appears disheveled/unkempt when evaluated.  There were reports of patient having a pruritic rash notably on his right thigh and also a more pronounced rash in his groin region.  Rash was noted to have worsened recently with heat exposure. He also has been constipated and was given a dose of lactulose with good response. He also underwent TEE DCCV on 11/08/2020.  Interval History:  Standing up at the sink this morning.  Energy is slowly improving.  Still on O2, prefers the mask but states it does dry his eyes some.  Feels better when he is up and moving out of bed and is asking if he can walk in the hall some with assistance today.  ROS: Constitutional: negative for chills and fevers, Respiratory: negative for cough, Cardiovascular: negative and Gastrointestinal: negative for abdominal pain  Assessment & Plan:  Atrial flutter - s/p TEE DCCV on 3/30; now back to NSR -Continue bisoprolol - management per cardiology   HFrEF - Patient still with some lower extremity edema.  BNP was initially 1119 echocardiogram revealed EF of 45 to 50%.  - Currently being diuresed with Lasix. - management per cardiology/primary team  Acute hypoxic respiratory failure -Likely component of underlying volume  overload/CHF as well as atelectasis from being in bed for prolonged amount of time -Has had spirometer ordered -Wean oxygen as able -Continue diuresis -Continue working with PT  Contact dermatitis Cutaneous candidiasis - likely heat related/poor hygeine rash noted on thighs and groin; agree likely combo of candidiasis and contact dermatitis from sweaty/dirty clothes -Continue nystatin cream to groin region -Continue hydrocortisone cream to thighs  Ongoing alcohol abuse Transaminitis Alcoholic cirrhosis without ascites  - does not appear to be in any further w/d; last CIWA scores were minimal - change ativan to q4PRN for anxiety at this point; would not prescribe on discharge; maybe a short course of librium if still felt to be in any w/d but now hospitalized for 4-5 days - continue vitamin supplements - Hepatitis panel negative - RUQ u/s performed on 3/29, early signs of cirrhosis; patient heavily counseled for alcohol cessation  - continue lactulose; dicussed his BM goal bedside (2-3 soft formed BM's daily); asterixis almost fully resolved  Physical deconditioning -Continue with physical therapy -Ambulate with assistance in hallway each shift  COPD Tobacco Use - hold off on steroids, especially with RVR - continue nicotine patch and breathing treatments   Acute kidney injury - baseline creatinine ~ 0.8 - patient presented with increase in creat >0.3 mg/dL above baseline - probable CRS vs arrhythmia mediated -Renal function responding to diuresis -BMP daily  Constipation - improved - continue lactulose   Old records reviewed in assessment of this patient  Antimicrobials: n/a  DVT prophylaxis:  apixaban (ELIQUIS) tablet 5 mg   Code Status:   Code Status: Full Code  Objective: Blood pressure 104/74, pulse 66, temperature (!) 97.4 F (36.3 C), temperature source Oral, resp. rate 16, height 5\' 10"  (1.778 m), weight 78.9 kg, SpO2 97 %.  Examination: General  appearance: Awake, alert, no acute distress.  Energy improving daily Head: Normocephalic, without obvious abnormality, atraumatic Eyes: Pupils are equal today, 2 mm each and reactive to light equally Lungs: clear to auscultation bilaterally Heart: regular rate and rhythm and S1, S2 normal Abdomen: Bowel sounds improved.  Nontender.  Obese. Extremities:  Improving rash in groin region.  Still noted with scattered erythematous papules around right hip extending to buttocks Skin: mobility and turgor normal Neurologic: overall improved; no focal deficits   Data Reviewed: I have personally reviewed following labs and imaging studies Results for orders placed or performed during the hospital encounter of 11/04/20 (from the past 24 hour(s))  Comprehensive metabolic panel     Status: Abnormal   Collection Time: 11/11/20  4:33 AM  Result Value Ref Range   Sodium 137 135 - 145 mmol/L   Potassium 3.1 (L) 3.5 - 5.1 mmol/L   Chloride 88 (L) 98 - 111 mmol/L   CO2 42 (H) 22 - 32 mmol/L   Glucose, Bld 105 (H) 70 - 99 mg/dL   BUN 15 6 - 20 mg/dL   Creatinine, Ser 0.80 0.61 - 1.24 mg/dL   Calcium 8.4 (L) 8.9 - 10.3 mg/dL   Total Protein 6.3 (L) 6.5 - 8.1 g/dL   Albumin 2.5 (L) 3.5 - 5.0 g/dL   AST 29 15 - 41 U/L   ALT 38 0 - 44 U/L   Alkaline Phosphatase 83 38 - 126 U/L   Total Bilirubin 1.7 (H) 0.3 - 1.2 mg/dL   GFR, Estimated >60 >60 mL/min   Anion gap 7 5 - 15  Magnesium     Status: None   Collection Time: 11/11/20  4:33 AM  Result Value Ref Range   Magnesium 1.7 1.7 - 2.4 mg/dL  Procalcitonin     Status: None   Collection Time: 11/11/20  4:33 AM  Result Value Ref Range   Procalcitonin <0.10 ng/mL  CBC with Differential/Platelet     Status: Abnormal   Collection Time: 11/11/20  4:33 AM  Result Value Ref Range   WBC 8.8 4.0 - 10.5 K/uL   RBC 4.89 4.22 - 5.81 MIL/uL   Hemoglobin 15.6 13.0 - 17.0 g/dL   HCT 49.1 39.0 - 52.0 %   MCV 100.4 (H) 80.0 - 100.0 fL   MCH 31.9 26.0 - 34.0 pg    MCHC 31.8 30.0 - 36.0 g/dL   RDW 13.2 11.5 - 15.5 %   Platelets 301 150 - 400 K/uL   nRBC 0.0 0.0 - 0.2 %   Neutrophils Relative % 66 %   Neutro Abs 5.8 1.7 - 7.7 K/uL   Lymphocytes Relative 19 %   Lymphs Abs 1.7 0.7 - 4.0 K/uL   Monocytes Relative 11 %   Monocytes Absolute 0.9 0.1 - 1.0 K/uL   Eosinophils Relative 3 %   Eosinophils Absolute 0.3 0.0 - 0.5 K/uL   Basophils Relative 1 %   Basophils Absolute 0.1 0.0 - 0.1 K/uL   Immature Granulocytes 0 %   Abs Immature Granulocytes 0.03 0.00 - 0.07 K/uL  Phosphorus     Status: None   Collection Time: 11/11/20  4:33 AM  Result Value Ref Range   Phosphorus  4.2 2.5 - 4.6 mg/dL    Recent Results (from the past 240 hour(s))  SARS CORONAVIRUS 2 (TAT 6-24 HRS) Nasopharyngeal Nasopharyngeal Swab     Status: None   Collection Time: 11/04/20  3:01 PM   Specimen: Nasopharyngeal Swab  Result Value Ref Range Status   SARS Coronavirus 2 NEGATIVE NEGATIVE Final    Comment: (NOTE) SARS-CoV-2 target nucleic acids are NOT DETECTED.  The SARS-CoV-2 RNA is generally detectable in upper and lower respiratory specimens during the acute phase of infection. Negative results do not preclude SARS-CoV-2 infection, do not rule out co-infections with other pathogens, and should not be used as the sole basis for treatment or other patient management decisions. Negative results must be combined with clinical observations, patient history, and epidemiological information. The expected result is Negative.  Fact Sheet for Patients: SugarRoll.be  Fact Sheet for Healthcare Providers: https://www.woods-mathews.com/  This test is not yet approved or cleared by the [place] FDA and  has been authorized for detection and/or diagnosis of SARS-CoV-2 by FDA under an Emergency Use Authorization (EUA). This EUA will remain  in effect (meaning this test can be used) for the duration of the COVID-19 declaration under Se  ction 564(b)(1) of the Act, 21 U.S.C. section 360bbb-3(b)(1), unless the authorization is terminated or revoked sooner.  Performed at [facility], [place] [address], [place], [place] [address]   Blood culture (routine x 2)     Status: None   Collection Time: 11/04/20  3:44 PM   Specimen: Left Antecubital; Blood  Result Value Ref Range Status   Specimen Description   Final    LEFT ANTECUBITAL BOTTLES DRAWN AEROBIC AND ANAEROBIC   Special Requests Blood Culture adequate volume  Final   Culture   Final    NO GROWTH 5 DAYS Performed at [facility], [address] [place], [place] [address]    Report Status 11/09/2020 FINAL  Final  Blood culture (routine x 2)     Status: None   Collection Time: 11/04/20  3:46 PM   Specimen: BLOOD RIGHT ARM  Result Value Ref Range Status   Specimen Description   Final    BLOOD RIGHT ARM BOTTLES DRAWN AEROBIC AND ANAEROBIC   Special Requests Blood Culture adequate volume  Final   Culture   Final    NO GROWTH 5 DAYS Performed at [facility], [address], [place], [place] [address]    Report Status 11/09/2020 FINAL  Final  MRSA PCR Screening     Status: None   Collection Time: 11/04/20 10:55 PM   Specimen: Nasopharyngeal  Result Value Ref Range Status   MRSA by PCR NEGATIVE NEGATIVE Final    Comment:        The GeneXpert MRSA Assay (FDA approved for NASAL specimens only), is one component of a comprehensive MRSA colonization surveillance program. It is not intended to diagnose MRSA infection nor to guide or monitor treatment for MRSA infections. Performed at [facility], [place] [address] [place], [place] [address]      Radiology Studies: DG CHEST PORT 1 VIEW  Result Date: 11/10/2020 CLINICAL DATA:  Shortness of breath Respiratory failure with hypoxia EXAM: PORTABLE CHEST 1 VIEW COMPARISON:  01/04/2021 FINDINGS: Mild cardiomegaly. Unchanged small right pleural effusion. Mild pulmonary vascular congestion. Focal airspace opacity  in the right mid lung suspicious for pneumonia. IMPRESSION: Radiographic findings most consistent with CHF/fluid volume overload with small right pleural effusion. There is more focal airspace opacity at the  right lung base which could represent superimposed pneumonia. Electronically Signed   By: Miachel Roux M.D.   On: 11/10/2020 08:05   DG CHEST PORT 1 VIEW  Final Result    US Abdomen Limited  Final Result    CT HEAD WO CONTRAST  Final Result    DG Chest Port 1 View  Final Result      Scheduled Meds: . apixaban  5 mg Oral BID  . bisoprolol  5 mg Oral Daily  . Chlorhexidine Gluconate Cloth  6 each Topical Q0600  . feeding supplement  1 Container Oral TID BM  . folic acid  1 mg Oral Daily  . furosemide  80 mg Intravenous Daily  . hydrocortisone cream  1 application Topical BID  . influenza vac split quadrivalent PF  0.5 mL Intramuscular Tomorrow-1000  . lactulose  20 g Oral BID  . mouth rinse  15 mL Mouth Rinse BID  . multivitamin with minerals  1 tablet Oral Daily  . nicotine  21 mg Transdermal Daily  . nystatin cream   Topical BID  . pneumococcal 23 valent vaccine  0.5 mL Intramuscular Tomorrow-1000  . potassium chloride  40 mEq Oral Once  . thiamine  100 mg Oral Daily   PRN Meds: acetaminophen, albuterol, LORazepam, magnesium hydroxide, ondansetron (ZOFRAN) IV Continuous Infusions:    LOS: 7 days  Time spent: Greater than 50% of the 35 minute visit was spent in counseling/coordination of care for the patient as laid out in the A&P.   Dwyane Dee, MD [facility] 11/11/2020, 11:47 AM

## 2020-11-11 NOTE — Progress Notes (Signed)
[facility] night shift.  The nursing staff reported that the patient's potassium was 3.1 mmol/L.  Magnesium level rose from 1.5 mg/dL yesterday to 1.7 mg/dL today after supplementation.  K-Dur 40 mEq p.o. x1 dose ordered.  Given the history of alcohol abuse I have reordered another 2 g of magnesium sulfate.  Phosphorus has been added to morning labs.  Tennis Must, MD.

## 2020-11-12 DIAGNOSIS — F1010 Alcohol abuse, uncomplicated: Secondary | ICD-10-CM | POA: Diagnosis not present

## 2020-11-12 DIAGNOSIS — I4892 Unspecified atrial flutter: Secondary | ICD-10-CM | POA: Diagnosis not present

## 2020-11-12 LAB — CBC WITH DIFFERENTIAL/PLATELET
Abs Immature Granulocytes: 0.03 10*3/uL (ref 0.00–0.07)
Basophils Absolute: 0 10*3/uL (ref 0.0–0.1)
Basophils Relative: 1 %
Eosinophils Absolute: 0.2 10*3/uL (ref 0.0–0.5)
Eosinophils Relative: 3 %
HCT: 46.4 % (ref 39.0–52.0)
Hemoglobin: 15 g/dL (ref 13.0–17.0)
Immature Granulocytes: 0 %
Lymphocytes Relative: 19 %
Lymphs Abs: 1.6 10*3/uL (ref 0.7–4.0)
MCH: 32.3 pg (ref 26.0–34.0)
MCHC: 32.3 g/dL (ref 30.0–36.0)
MCV: 100 fL (ref 80.0–100.0)
Monocytes Absolute: 0.8 10*3/uL (ref 0.1–1.0)
Monocytes Relative: 10 %
Neutro Abs: 6 10*3/uL (ref 1.7–7.7)
Neutrophils Relative %: 67 %
Platelets: 288 10*3/uL (ref 150–400)
RBC: 4.64 MIL/uL (ref 4.22–5.81)
RDW: 13.3 % (ref 11.5–15.5)
WBC: 8.8 10*3/uL (ref 4.0–10.5)
nRBC: 0 % (ref 0.0–0.2)

## 2020-11-12 LAB — PROCALCITONIN: Procalcitonin: <0.1 ng/mL

## 2020-11-12 LAB — COMPREHENSIVE METABOLIC PANEL
ALT: 34 U/L (ref 0–44)
AST: 28 U/L (ref 15–41)
Albumin: 2.5 g/dL — ABNORMAL LOW (ref 3.5–5.0)
Alkaline Phosphatase: 74 U/L (ref 38–126)
Anion gap: 4 — ABNORMAL LOW (ref 5–15)
BUN: 14 mg/dL (ref 6–20)
CO2: 42 mmol/L — ABNORMAL HIGH (ref 22–32)
Calcium: 8.5 mg/dL — ABNORMAL LOW (ref 8.9–10.3)
Chloride: 93 mmol/L — ABNORMAL LOW (ref 98–111)
Creatinine, Ser: 0.65 mg/dL (ref 0.61–1.24)
GFR, Estimated: >60 mL/min (ref >60)
Glucose, Bld: 104 mg/dL — ABNORMAL HIGH (ref 70–99)
Potassium: 3.4 mmol/L — ABNORMAL LOW (ref 3.5–5.1)
Sodium: 139 mmol/L (ref 135–145)
Total Bilirubin: 1.3 mg/dL — ABNORMAL HIGH (ref 0.3–1.2)
Total Protein: 6 g/dL — ABNORMAL LOW (ref 6.5–8.1)

## 2020-11-12 LAB — MAGNESIUM: Magnesium: 1.8 mg/dL (ref 1.7–2.4)

## 2020-11-12 MED ORDER — POTASSIUM CHLORIDE 20 MEQ PO PACK
40.0000 meq | PACK | Freq: Once | ORAL | Status: AC
  Administered 2020-11-12: 40 meq via ORAL
  Filled 2020-11-12: qty 2

## 2020-11-12 MED ORDER — POTASSIUM CHLORIDE CRYS ER 20 MEQ PO TBCR
40.0000 meq | EXTENDED_RELEASE_TABLET | Freq: Once | ORAL | Status: AC
  Administered 2020-11-12: 40 meq via ORAL
  Filled 2020-11-12: qty 2

## 2020-11-12 MED ORDER — SPIRONOLACTONE 12.5 MG HALF TABLET
12.5000 mg | ORAL_TABLET | Freq: Every day | ORAL | Status: DC
  Administered 2020-11-12 – 2020-11-14 (×3): 12.5 mg via ORAL
  Filled 2020-11-12 (×3): qty 1

## 2020-11-12 NOTE — Progress Notes (Signed)
Progress Note  Patient Name: Jordan Kelly Date of Encounter: 11/12/2020  Primary Cardiologist: No primary care provider on file.   Subjective   Continues to lose weight.  Has been up wit PT and is eager to take a shower.  Inpatient Medications    Scheduled Meds: . apixaban  5 mg Oral BID  . bisoprolol  5 mg Oral Daily  . Chlorhexidine Gluconate Cloth  6 each Topical Q0600  . feeding supplement  1 Container Oral TID BM  . folic acid  1 mg Oral Daily  . furosemide  80 mg Intravenous Daily  . hydrocortisone cream  1 application Topical BID  . influenza vac split quadrivalent PF  0.5 mL Intramuscular Tomorrow-1000  . lactulose  20 g Oral BID  . mouth rinse  15 mL Mouth Rinse BID  . multivitamin with minerals  1 tablet Oral Daily  . nicotine  21 mg Transdermal Daily  . nystatin cream   Topical BID  . pneumococcal 23 valent vaccine  0.5 mL Intramuscular Tomorrow-1000  . potassium chloride  40 mEq Oral Once  . spironolactone  12.5 mg Oral Daily  . thiamine  100 mg Oral Daily   Continuous Infusions:  PRN Meds: acetaminophen, albuterol, LORazepam, magnesium hydroxide, ondansetron (ZOFRAN) IV   Vital Signs    Vitals:   11/11/20 1309 11/11/20 1800 11/11/20 2007 11/12/20 0433  BP: 96/68  107/79 105/70  Pulse: 70  77 67  Resp: 20  19 18   Temp: 98.7 F (37.1 C)  98.6 F (37 C) 98.7 F (37.1 C)  TempSrc: Oral  Oral Oral  SpO2: 96% 93% 95% 96%  Weight:    78.5 kg  Height:        Intake/Output Summary (Last 24 hours) at 11/12/2020 1024 Last data filed at 11/11/2020 2100 Gross per 24 hour  Intake 600 ml  Output 700 ml  Net -100 ml   Filed Weights   11/10/20 0518 11/11/20 0403 11/12/20 0433  Weight: 80.6 kg 78.9 kg 78.5 kg    Telemetry    SR with PACs rare  - Personally Reviewed  ECG    No new - Personally Reviewed  Physical Exam   GEN: No acute distress.   Neck: No JVD Cardiac: regular rate and rhythm, II/VI holosystolic murmur,no rubs, or gallops.   Respiratory:  Wheezes in lung bases good respiratory effor GI: Soft, with non tender non distended; umbilical hernia noted MS: +1 edema in the ankles but warm; No deformity.  Neuro:  Nonfocal, no asterixis Psych: Normal affect  Skin:  Red macular rash in intertriginous zone improved from prior  Labs    Chemistry Recent Labs  Lab 11/10/20 0343 11/11/20 0433 11/12/20 0345  NA 140 137 139  K 3.5 3.1* 3.4*  CL 93* 88* 93*  CO2 42* 42* 42*  GLUCOSE 92 105* 104*  BUN 19 15 14   CREATININE 0.71 0.80 0.65  CALCIUM 8.5* 8.4* 8.5*  PROT 6.0* 6.3* 6.0*  ALBUMIN 2.5* 2.5* 2.5*  AST 30 29 28   ALT 40 38 34  ALKPHOS 83 83 74  BILITOT 1.4* 1.7* 1.3*  GFRNONAA >60 >60 >60  ANIONGAP 5 7 4*     Hematology Recent Labs  Lab 11/07/20 0042 11/11/20 0433 11/12/20 0345  WBC 8.7 8.8 8.8  RBC 4.68 4.89 4.64  HGB 15.1 15.6 15.0  HCT 49.5 49.1 46.4  MCV 105.8* 100.4* 100.0  MCH 32.3 31.9 32.3  MCHC 30.5 31.8 32.3  RDW 13.6  13.2 13.3  PLT 295 301 288    Cardiac EnzymesNo results for input(s): TROPONINI in the last 168 hours. No results for input(s): TROPIPOC in the last 168 hours.   BNP Recent Labs  Lab 11/07/20 0042 11/08/20 0204  BNP 1,195.6* 1,061.0*     DDimer No results for input(s): DDIMER in the last 168 hours.   Radiology    No results found.  Cardiac Studies   Atrial septal defect in systole and diastole  CXR cannot rule out superimposed PNA;   Patient Profile     60 y.o. male COPD and Tranaminitis with with chronic alcohol and tobacco abuse who presented with fatigue, decrease urine output and hyperammonemia  Assessment & Plan    Secundum ASD with associated RV dysfunction Hypokalemia Hypomagnesemia - without frank RV failure - 80 IV lasix with no increase in his creatinine or BUN; still requiring O2; will continue; electrolyte catch up is the reason we are not more aggressive - pending outpatient f/u - started aldactone 12.5mg  PO today with K  repletion - has lost ~ 13 lbs+ since admission  Typical Atrial Flutter Fall without evidence of intracranial bleed - continue new start DOAC - rate controlled on bisoprolol 5 mg - s/p 11/08/20 DC  COPD Tobacco Abuse - has Nicotine Patch - restarting his home albuterol PRN SOB - [facility] assisting; if we cannot wean O2 with diuresis will attempt steroid burst - Continue IS  Alcohol abuse with prior withdrawal symptoms (resolved through course) Tranaminitis with US findings that may suggest cirrhosis - LFT normalized through course; checking only BMP Constipation (resolved) Umbilical hernia with prior repair Hyperammonemia with some asterixis (resolved presently) Malnutrition with albumin 2.9 - continue thiamine - Continue lactulose - lasix as above - [facility] team assisting; benzos now for anxiety component largely  AKI  -  improving with diuresis with stable contraction alkalosis; will possible transition to PO lasix 11/13/20   Skin Rash - contine nystatin cream - appreciate [facility]  DVT Proph- DOAC Labs ordered (BMP) Full Code Diet-  cardiac with protein supplements once procedure is finished Likely home with home health; will follow PT/OT recs If PT and OT are in agreement he is OK to shower   For questions or updates, please contact [facility] Please consult [email or web address] for contact info under Cardiology/STEMI.      Signed, Werner Lean, MD  11/12/2020, 10:24 AM

## 2020-11-12 NOTE — Progress Notes (Signed)
SATURATION QUALIFICATIONS:    Patient Saturations on Room Air at Rest = 91%  Patient Saturations on Room Air while Ambulating = 87%  Patient Saturations on 2 Liters of oxygen while Ambulating = 94%  Patient walked 400 ft. Tolerated well.

## 2020-11-12 NOTE — Progress Notes (Signed)
Consult Progress Note    Jordan Kelly   DZH:[id number]  DOB: 09/28/1960  DOA: 11/04/2020     8  PCP: Noreene Larsson, NP  Reason for consult: rash, history of alcohol use  Hospital Course: Jordan Kelly is a 60 y.o. male with PMH tobacco and alcohol abuse who initially presented with complaints of constipation, fatigue, and edema on 3/26.  Patient was initially found to be in atrial flutter.  Labs were significant for BUN 46, creatinine 1.28, AST 70, ALT 99, and BNP 1119.   Patient was initially started on Cardizem drip, but was transitioned to bisoprolol and tolerated well.  He has a history of drinking approx 6-pack beer daily. 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  He appears disheveled/unkempt when evaluated.  There were reports of patient having a pruritic rash notably on his right thigh and also a more pronounced rash in his groin region.  Rash was noted to have worsened recently with heat exposure. He also has been constipated and was given a dose of lactulose with good response. He also underwent TEE DCCV on 11/08/2020.  Interval History:  No events overnight.  Also still asking to take a shower if able.  States that he feels good when he is out of bed and up and moving. Denies any coughing or wheezing.   ROS: Constitutional: negative for chills and fevers, Respiratory: negative for cough, Cardiovascular: negative and Gastrointestinal: negative for abdominal pain  Assessment & Plan:  Atrial flutter - s/p TEE DCCV on 3/30; now back to NSR -Continue bisoprolol - management per cardiology   HFrEF - Patient still with some lower extremity edema.  BNP was initially 1119 echocardiogram revealed EF of 45 to 50%.  - Currently being diuresed with Lasix. - management per cardiology/primary team  Acute hypoxic respiratory failure -Likely component of underlying volume overload/CHF as well as atelectasis from being in bed for prolonged amount of time -Has had  spirometer ordered -Wean oxygen as able -Continue diuresis -Continue working with PT  Contact dermatitis Cutaneous candidiasis - likely heat related/poor hygeine rash noted on thighs and groin; agree likely combo of candidiasis and contact dermatitis from sweaty/dirty clothes -Continue nystatin cream to groin region -Continue hydrocortisone cream to thighs  Ongoing alcohol abuse Transaminitis Alcoholic cirrhosis without ascites  - does not appear to be in any further w/d; last CIWA scores were minimal - change ativan to q4PRN for anxiety at this point; would not prescribe on discharge; maybe a short course of librium if still felt to be in any w/d but now hospitalized for 4-5 days - continue vitamin supplements - Hepatitis panel negative - RUQ u/s performed on 3/29, early signs of cirrhosis; patient heavily counseled for alcohol cessation  - continue lactulose; dicussed his BM goal bedside (2-3 soft formed BM's daily)  Physical deconditioning -Continue with physical therapy -Ambulate with assistance in hallway each shift  COPD Tobacco Use - hold off on steroids; no wheezing currently or concern for exacerbation  - continue nicotine patch and breathing treatments   Acute kidney injury - baseline creatinine ~ 0.8 - patient presented with increase in creat >0.3 mg/dL above baseline - probable CRS vs arrhythmia mediated -Renal function responding to diuresis -BMP daily  Constipation - improved - continue lactulose   Old records reviewed in assessment of this patient  Antimicrobials: n/a  DVT prophylaxis:  apixaban (ELIQUIS) tablet 5 mg   Code Status:   Code Status: Full Code  Objective:  Blood pressure 105/70, pulse 67, temperature 98.7 F (37.1 C), temperature source Oral, resp. rate 18, height 5\' 10"  (1.778 m), weight 78.5 kg, SpO2 96 %.  Examination: General appearance: Awake, alert, no acute distress.  Energy improving daily Head: Normocephalic, without obvious  abnormality, atraumatic Eyes: PERRL, EOMI Lungs: clear to auscultation bilaterally Heart: regular rate and rhythm and S1, S2 normal Abdomen: Bowel sounds improved.  Nontender.  Obese. Extremities:  Improving rash in groin region.  Still noted with scattered erythematous papules around right hip extending to buttocks Skin: mobility and turgor normal Neurologic: overall improved; no focal deficits   Data Reviewed: I have personally reviewed following labs and imaging studies Results for orders placed or performed during the hospital encounter of 11/04/20 (from the past 24 hour(s))  Comprehensive metabolic panel     Status: Abnormal   Collection Time: 11/12/20  3:45 AM  Result Value Ref Range   Sodium 139 135 - 145 mmol/L   Potassium 3.4 (L) 3.5 - 5.1 mmol/L   Chloride 93 (L) 98 - 111 mmol/L   CO2 42 (H) 22 - 32 mmol/L   Glucose, Bld 104 (H) 70 - 99 mg/dL   BUN 14 6 - 20 mg/dL   Creatinine, Ser 0.65 0.61 - 1.24 mg/dL   Calcium 8.5 (L) 8.9 - 10.3 mg/dL   Total Protein 6.0 (L) 6.5 - 8.1 g/dL   Albumin 2.5 (L) 3.5 - 5.0 g/dL   AST 28 15 - 41 U/L   ALT 34 0 - 44 U/L   Alkaline Phosphatase 74 38 - 126 U/L   Total Bilirubin 1.3 (H) 0.3 - 1.2 mg/dL   GFR, Estimated >60 >60 mL/min   Anion gap 4 (L) 5 - 15  Magnesium     Status: None   Collection Time: 11/12/20  3:45 AM  Result Value Ref Range   Magnesium 1.8 1.7 - 2.4 mg/dL  Procalcitonin     Status: None   Collection Time: 11/12/20  3:45 AM  Result Value Ref Range   Procalcitonin <0.10 ng/mL  CBC with Differential/Platelet     Status: None   Collection Time: 11/12/20  3:45 AM  Result Value Ref Range   WBC 8.8 4.0 - 10.5 K/uL   RBC 4.64 4.22 - 5.81 MIL/uL   Hemoglobin 15.0 13.0 - 17.0 g/dL   HCT 46.4 39.0 - 52.0 %   MCV 100.0 80.0 - 100.0 fL   MCH 32.3 26.0 - 34.0 pg   MCHC 32.3 30.0 - 36.0 g/dL   RDW 13.3 11.5 - 15.5 %   Platelets 288 150 - 400 K/uL   nRBC 0.0 0.0 - 0.2 %   Neutrophils Relative % 67 %   Neutro Abs 6.0 1.7 -  7.7 K/uL   Lymphocytes Relative 19 %   Lymphs Abs 1.6 0.7 - 4.0 K/uL   Monocytes Relative 10 %   Monocytes Absolute 0.8 0.1 - 1.0 K/uL   Eosinophils Relative 3 %   Eosinophils Absolute 0.2 0.0 - 0.5 K/uL   Basophils Relative 1 %   Basophils Absolute 0.0 0.0 - 0.1 K/uL   Immature Granulocytes 0 %   Abs Immature Granulocytes 0.03 0.00 - 0.07 K/uL    Recent Results (from the past 240 hour(s))  SARS CORONAVIRUS 2 (TAT 6-24 HRS) Nasopharyngeal Nasopharyngeal Swab     Status: None   Collection Time: 11/04/20  3:01 PM   Specimen: Nasopharyngeal Swab  Result Value Ref Range Status   SARS Coronavirus 2 NEGATIVE NEGATIVE  Final    Comment: (NOTE) SARS-CoV-2 target nucleic acids are NOT DETECTED.  The SARS-CoV-2 RNA is generally detectable in upper and lower respiratory specimens during the acute phase of infection. Negative results do not preclude SARS-CoV-2 infection, do not rule out co-infections with other pathogens, and should not be used as the sole basis for treatment or other patient management decisions. Negative results must be combined with clinical observations, patient history, and epidemiological information. The expected result is Negative.  Fact Sheet for Patients: SugarRoll.be  Fact Sheet for Healthcare Providers: https://www.woods-mathews.com/  This test is not yet approved or cleared by the [place] FDA and  has been authorized for detection and/or diagnosis of SARS-CoV-2 by FDA under an Emergency Use Authorization (EUA). This EUA will remain  in effect (meaning this test can be used) for the duration of the COVID-19 declaration under Se ction 564(b)(1) of the Act, 21 U.S.C. section 360bbb-3(b)(1), unless the authorization is terminated or revoked sooner.  Performed at [facility], [place] [address] [place], [place] [address]   Blood culture (routine x 2)     Status: None   Collection Time: 11/04/20  3:44 PM    Specimen: Left Antecubital; Blood  Result Value Ref Range Status   Specimen Description   Final    LEFT ANTECUBITAL BOTTLES DRAWN AEROBIC AND ANAEROBIC   Special Requests Blood Culture adequate volume  Final   Culture   Final    NO GROWTH 5 DAYS Performed at [facility], [address] [place], [place] [address]    Report Status 11/09/2020 FINAL  Final  Blood culture (routine x 2)     Status: None   Collection Time: 11/04/20  3:46 PM   Specimen: BLOOD RIGHT ARM  Result Value Ref Range Status   Specimen Description   Final    BLOOD RIGHT ARM BOTTLES DRAWN AEROBIC AND ANAEROBIC   Special Requests Blood Culture adequate volume  Final   Culture   Final    NO GROWTH 5 DAYS Performed at [facility], [address] [place], [place] [address]    Report Status 11/09/2020 FINAL  Final  MRSA PCR Screening     Status: None   Collection Time: 11/04/20 10:55 PM   Specimen: Nasopharyngeal  Result Value Ref Range Status   MRSA by PCR NEGATIVE NEGATIVE Final    Comment:        The GeneXpert MRSA Assay (FDA approved for NASAL specimens only), is one component of a comprehensive MRSA colonization surveillance program. It is not intended to diagnose MRSA infection nor to guide or monitor treatment for MRSA infections. Performed at [facility], [place] [address] [place],  [address]      Radiology Studies: No results found. DG CHEST PORT 1 VIEW  Final Result    US Abdomen Limited  Final Result    CT HEAD WO CONTRAST  Final Result    DG Chest Port 1 View  Final Result      Scheduled Meds: . apixaban  5 mg Oral BID  . bisoprolol  5 mg Oral Daily  . Chlorhexidine Gluconate Cloth  6 each Topical Q0600  . feeding supplement  1 Container Oral TID BM  . folic acid  1 mg Oral Daily  . furosemide  80 mg Intravenous Daily  . hydrocortisone cream  1 application Topical BID  . influenza vac split quadrivalent PF  0.5 mL Intramuscular Tomorrow-1000  . lactulose  20 g Oral  BID  .  mouth rinse  15 mL Mouth Rinse BID  . multivitamin with minerals  1 tablet Oral Daily  . nicotine  21 mg Transdermal Daily  . nystatin cream   Topical BID  . pneumococcal 23 valent vaccine  0.5 mL Intramuscular Tomorrow-1000  . potassium chloride  40 mEq Oral Once  . spironolactone  12.5 mg Oral Daily  . thiamine  100 mg Oral Daily   PRN Meds: acetaminophen, albuterol, LORazepam, magnesium hydroxide, ondansetron (ZOFRAN) IV Continuous Infusions:    LOS: 8 days  Time spent: Greater than 50% of the 35 minute visit was spent in counseling/coordination of care for the patient as laid out in the A&P.   Dwyane Dee, MD [facility] 11/12/2020, 11:26 AM

## 2020-11-13 ENCOUNTER — Inpatient Hospital Stay (HOSPITAL_COMMUNITY): Payer: 59

## 2020-11-13 DIAGNOSIS — L409 Psoriasis, unspecified: Secondary | ICD-10-CM | POA: Diagnosis not present

## 2020-11-13 DIAGNOSIS — I4892 Unspecified atrial flutter: Secondary | ICD-10-CM | POA: Diagnosis not present

## 2020-11-13 DIAGNOSIS — I2609 Other pulmonary embolism with acute cor pulmonale: Secondary | ICD-10-CM

## 2020-11-13 LAB — CBC WITH DIFFERENTIAL/PLATELET
Abs Immature Granulocytes: 0.04 10*3/uL (ref 0.00–0.07)
Basophils Absolute: 0.1 10*3/uL (ref 0.0–0.1)
Basophils Relative: 1 %
Eosinophils Absolute: 0.2 10*3/uL (ref 0.0–0.5)
Eosinophils Relative: 3 %
HCT: 49.8 % (ref 39.0–52.0)
Hemoglobin: 16 g/dL (ref 13.0–17.0)
Immature Granulocytes: 1 %
Lymphocytes Relative: 16 %
Lymphs Abs: 1.3 10*3/uL (ref 0.7–4.0)
MCH: 31.6 pg (ref 26.0–34.0)
MCHC: 32.1 g/dL (ref 30.0–36.0)
MCV: 98.2 fL (ref 80.0–100.0)
Monocytes Absolute: 0.7 10*3/uL (ref 0.1–1.0)
Monocytes Relative: 8 %
Neutro Abs: 5.9 10*3/uL (ref 1.7–7.7)
Neutrophils Relative %: 71 %
Platelets: 268 10*3/uL (ref 150–400)
RBC: 5.07 MIL/uL (ref 4.22–5.81)
RDW: 13.2 % (ref 11.5–15.5)
WBC: 8.1 10*3/uL (ref 4.0–10.5)
nRBC: 0 % (ref 0.0–0.2)

## 2020-11-13 LAB — COMPREHENSIVE METABOLIC PANEL
ALT: 35 U/L (ref 0–44)
AST: 31 U/L (ref 15–41)
Albumin: 2.6 g/dL — ABNORMAL LOW (ref 3.5–5.0)
Alkaline Phosphatase: 82 U/L (ref 38–126)
Anion gap: 8 (ref 5–15)
BUN: 19 mg/dL (ref 6–20)
CO2: 37 mmol/L — ABNORMAL HIGH (ref 22–32)
Calcium: 9 mg/dL (ref 8.9–10.3)
Chloride: 94 mmol/L — ABNORMAL LOW (ref 98–111)
Creatinine, Ser: 0.73 mg/dL (ref 0.61–1.24)
GFR, Estimated: >60 mL/min (ref >60)
Glucose, Bld: 102 mg/dL — ABNORMAL HIGH (ref 70–99)
Potassium: 3.6 mmol/L (ref 3.5–5.1)
Sodium: 139 mmol/L (ref 135–145)
Total Bilirubin: 1.8 mg/dL — ABNORMAL HIGH (ref 0.3–1.2)
Total Protein: 6.1 g/dL — ABNORMAL LOW (ref 6.5–8.1)

## 2020-11-13 LAB — MAGNESIUM: Magnesium: 1.7 mg/dL (ref 1.7–2.4)

## 2020-11-13 MED ORDER — FUROSEMIDE 10 MG/ML IJ SOLN
100.0000 mg | Freq: Once | INTRAVENOUS | Status: AC
  Administered 2020-11-13: 100 mg via INTRAVENOUS
  Filled 2020-11-13: qty 10

## 2020-11-13 MED ORDER — TRIAMCINOLONE ACETONIDE 0.5 % EX CREA
TOPICAL_CREAM | Freq: Two times a day (BID) | CUTANEOUS | Status: DC
  Administered 2020-11-13: 1 via TOPICAL
  Filled 2020-11-13: qty 15

## 2020-11-13 MED ORDER — IOHEXOL 350 MG/ML SOLN
75.0000 mL | Freq: Once | INTRAVENOUS | Status: AC | PRN
  Administered 2020-11-13: 75 mL via INTRAVENOUS

## 2020-11-13 MED ORDER — TORSEMIDE 20 MG PO TABS
20.0000 mg | ORAL_TABLET | Freq: Every day | ORAL | Status: DC
  Administered 2020-11-13: 20 mg via ORAL
  Filled 2020-11-13: qty 1

## 2020-11-13 NOTE — Progress Notes (Signed)
Physical Therapy Treatment Patient Details Name: ORBIE GRUPE MRN: [id number] DOB: 1961/01/23 Today's Date: 11/13/2020    History of Present Illness 60 yo admitted 3/26 with fatigue and edema found to be in [place]. Pt with candidiasis and contact dermatitis of thigh. Fall on 3/29. TEE 3/30. Pt with CHF and cirrhosis, agitation and hallucination with withdrawal acutely. PMhx: tobacco and ETOH abuse, COPD    PT Comments    Patient progressing well with mobility. Reports feeling back to his baseline today and eager to return home. Able to take a shower this morning and this made him feel better. Tolerated higher level balance challenges during gait today- stepping over objects, head turns, walking backwards, changes in direction etc with only mild deviations in gait but no overt LOB. Pt has met all goals and does not require any further skilled therapy services. Encouraged walking in hallway with nursing as able. Discharge from therapy.    Follow Up Recommendations  No PT follow up;Supervision - Intermittent     Equipment Recommendations  None recommended by PT    Recommendations for Other Services       Precautions / Restrictions Precautions Precautions: None Restrictions Weight Bearing Restrictions: No    Mobility  Bed Mobility               General bed mobility comments: Standing in room upon PT arrival.    Transfers Overall transfer level: Needs assistance Equipment used: None Transfers: Sit to/from Stand Sit to Stand: Independent         General transfer comment: No assist needed.  Ambulation/Gait Ambulation/Gait assistance: Modified independent (Device/Increase time) Gait Distance (Feet): 400 Feet Assistive device: None Gait Pattern/deviations: Step-through pattern;Decreased stride length   Gait velocity interpretation: 1.31 - 2.62 ft/sec, indicative of limited community ambulator General Gait Details: Slow, steady gait tolerating higher level balance  challenges without LOB or difficulty. See balance section for details.   Stairs             Wheelchair Mobility    Modified Rankin (Stroke Patients Only)       Balance Overall balance assessment: Needs assistance Sitting-balance support: No upper extremity supported;Feet supported Sitting balance-Leahy Scale: Good     Standing balance support: During functional activity Standing balance-Leahy Scale: Good               High level balance activites: Backward walking;Direction changes;Turns;Sudden stops;Head turns High Level Balance Comments: Tolerated above with only mild deviations in gait but no overt LOB. Able to step over objects and walk backwards.            Cognition Arousal/Alertness: Awake/alert Behavior During Therapy: WFL for tasks assessed/performed Overall Cognitive Status: Within Functional Limits for tasks assessed                                 General Comments: Pt reports feeling close to baseline with regards to cognition today esp for basic mobility tasks.      Exercises      General Comments General comments (skin integrity, edema, etc.): VSS on RA.      Pertinent Vitals/Pain Pain Assessment: No/denies pain    Home Living                      Prior Function            PT Goals (current goals can now be found in the care  plan section) Progress towards PT goals: Goals met/education completed, patient discharged from PT    Frequency    Min 3X/week      PT Plan Discharge plan needs to be updated;Equipment recommendations need to be updated    Co-evaluation              AM-PAC PT "6 Clicks" Mobility   Outcome Measure  Help needed turning from your back to your side while in a flat bed without using bedrails?: None Help needed moving from lying on your back to sitting on the side of a flat bed without using bedrails?: None Help needed moving to and from a bed to a chair (including a  wheelchair)?: None Help needed standing up from a chair using your arms (e.g., wheelchair or bedside chair)?: None Help needed to walk in hospital room?: A Little Help needed climbing 3-5 steps with a railing? : A Little 6 Click Score: 22    End of Session   Activity Tolerance: Patient tolerated treatment well Patient left: Other (comment) (standing in room) Nurse Communication: Mobility status PT Visit Diagnosis: Other abnormalities of gait and mobility (R26.89);Difficulty in walking, not elsewhere classified (R26.2)     Time: 2824-1753 PT Time Calculation (min) (ACUTE ONLY): 10 min  Charges:  $Neuromuscular Re-education: 8-22 mins                     Marisa Severin, PT, DPT Acute Rehabilitation Services Pager [phone] Office [place] 11/13/2020, 12:54 PM

## 2020-11-13 NOTE — Progress Notes (Signed)
Cardiology Progress Note  Patient ID: Jordan Kelly MRN: [id number] DOB: [birth year] Date of Encounter: 11/13/2020  Primary Cardiologist: No primary care provider on file.  Subjective   Chief Complaint: Shortness of breath.  HPI: Euvolemic.  Desaturations noted while ambulating.  On 2 L oxygen.  ROS:  All other ROS reviewed and negative. Pertinent positives noted in the HPI.     Inpatient Medications  Scheduled Meds: . apixaban  5 mg Oral BID  . bisoprolol  5 mg Oral Daily  . Chlorhexidine Gluconate Cloth  6 each Topical Q0600  . feeding supplement  1 Container Oral TID BM  . folic acid  1 mg Oral Daily  . influenza vac split quadrivalent PF  0.5 mL Intramuscular Tomorrow-1000  . lactulose  20 g Oral BID  . mouth rinse  15 mL Mouth Rinse BID  . multivitamin with minerals  1 tablet Oral Daily  . nicotine  21 mg Transdermal Daily  . pneumococcal 23 valent vaccine  0.5 mL Intramuscular Tomorrow-1000  . spironolactone  12.5 mg Oral Daily  . thiamine  100 mg Oral Daily  . torsemide  20 mg Oral Daily  . triamcinolone cream   Topical BID   Continuous Infusions:  PRN Meds: acetaminophen, albuterol, LORazepam, magnesium hydroxide, ondansetron (ZOFRAN) IV   Vital Signs   Vitals:   11/12/20 1200 11/12/20 1400 11/12/20 1932 11/13/20 0430  BP:   116/78 109/75  Pulse:   79 72  Resp:   18 18  Temp:   97.8 F (36.6 C) 98.1 F (36.7 C)  TempSrc:   Oral Oral  SpO2: 90% 94% 91% 91%  Weight:    76.4 kg  Height:        Intake/Output Summary (Last 24 hours) at 11/13/2020 0924 Last data filed at 11/13/2020 0431 Gross per 24 hour  Intake 240 ml  Output 500 ml  Net -260 ml   Last 3 Weights 11/13/2020 11/12/2020 11/11/2020  Weight (lbs) 168 lb 8 oz 173 lb 1 oz 173 lb 15.1 oz  Weight (kg) 76.431 kg 78.5 kg 78.9 kg      Telemetry  Overnight telemetry shows sinus rhythm heart rate in the 80s, which I personally reviewed.   ECG  The most recent ECG shows sinus rhythm heart rate 82,  anterior T wave inversions, which I personally reviewed.   Physical Exam   Vitals:   11/12/20 1200 11/12/20 1400 11/12/20 1932 11/13/20 0430  BP:   116/78 109/75  Pulse:   79 72  Resp:   18 18  Temp:   97.8 F (36.6 C) 98.1 F (36.7 C)  TempSrc:   Oral Oral  SpO2: 90% 94% 91% 91%  Weight:    76.4 kg  Height:         Intake/Output Summary (Last 24 hours) at 11/13/2020 0924 Last data filed at 11/13/2020 0431 Gross per 24 hour  Intake 240 ml  Output 500 ml  Net -260 ml    Last 3 Weights 11/13/2020 11/12/2020 11/11/2020  Weight (lbs) 168 lb 8 oz 173 lb 1 oz 173 lb 15.1 oz  Weight (kg) 76.431 kg 78.5 kg 78.9 kg    Body mass index is 24.18 kg/m.  General: Well nourished, well developed, in no acute distress Head: Atraumatic, normal size  Eyes: PEERLA, EOMI  Neck: Supple, JVD 7 to 8 cm of water Endocrine: No thryomegaly Cardiac: Normal S1, S2; RRR; no murmurs, rubs, or gallops Lungs: Clear to auscultation bilaterally, no wheezing, rhonchi  or rales  Abd: Soft, nontender, no hepatomegaly  Ext: Trace edema in the ankles Musculoskeletal: No deformities, BUE and BLE strength normal and equal Skin: Warm and dry, no rashes   Neuro: Alert and oriented to person, place, time, and situation, CNII-XII grossly intact, no focal deficits  Psych: Normal mood and affect   Labs  High Sensitivity Troponin:  No results for input(s): TROPONINIHS in the last 720 hours.   Cardiac EnzymesNo results for input(s): TROPONINI in the last 168 hours. No results for input(s): TROPIPOC in the last 168 hours.  Chemistry Recent Labs  Lab 11/11/20 0433 11/12/20 0345 11/13/20 0721  NA 137 139 139  K 3.1* 3.4* 3.6  CL 88* 93* 94*  CO2 42* 42* 37*  GLUCOSE 105* 104* 102*  BUN 15 14 19   CREATININE 0.80 0.65 0.73  CALCIUM 8.4* 8.5* 9.0  PROT 6.3* 6.0* 6.1*  ALBUMIN 2.5* 2.5* 2.6*  AST 29 28 31   ALT 38 34 35  ALKPHOS 83 74 82  BILITOT 1.7* 1.3* 1.8*  GFRNONAA >60 >60 >60  ANIONGAP 7 4* 8     Hematology Recent Labs  Lab 11/11/20 0433 11/12/20 0345 11/13/20 0721  WBC 8.8 8.8 8.1  RBC 4.89 4.64 5.07  HGB 15.6 15.0 16.0  HCT 49.1 46.4 49.8  MCV 100.4* 100.0 98.2  MCH 31.9 32.3 31.6  MCHC 31.8 32.3 32.1  RDW 13.2 13.3 13.2  PLT 301 288 268   BNP Recent Labs  Lab 11/07/20 0042 11/08/20 0204  BNP 1,195.6* 1,061.0*    DDimer No results for input(s): DDIMER in the last 168 hours.   Radiology  No results found.  Cardiac Studies  TTE 11/05/2020 1. Left ventricular ejection fraction, by estimation, is 45 to 50%. The  left ventricle has mildly decreased function. The left ventricle  demonstrates global hypokinesis. Left ventricular diastolic function could  not be evaluated. There is the  interventricular septum is flattened in diastole ('D' shaped left  ventricle), consistent with right ventricular volume overload. The average  left ventricular global longitudinal strain is -12.0 %. The global  longitudinal strain is abnormal.  2. Right ventricular systolic function is moderately reduced. The right  ventricular size is moderately enlarged. There is mildly elevated  pulmonary artery systolic pressure.  3. Right atrial size was severely dilated.  4. The mitral valve is normal in structure. No evidence of mitral valve  regurgitation.  5. The aortic valve is tricuspid. Aortic valve regurgitation is not  visualized. No aortic stenosis is present.  6. The inferior vena cava is dilated in size with <50% respiratory  variability, suggesting right atrial pressure of 15 mmHg.  7. Evidence of atrial level shunting detected by color flow Doppler.  There is a small secundum atrial septal defect with bidirectional shunting  across the atrial septum.   Patient Profile  Jordan Kelly is a 60 y.o. male with COPD, alcohol abuse who was admitted on 11/04/2020 with acute right heart failure and atrial flutter.  He underwent TEE/cardioversion on 11/08/2020.  Assessment & Plan    1.  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  He does not have a secundum ASD.  He has a PFO with right to left shunting due to elevated right heart pressures. -He has cor pulmonale.  This has not been worked up.  Unfortunately right left heart cath will be difficult as he is undergone cardioversion.  He will need to wait 1  month and have to do this as an outpatient. -We will obtain a CT PE study.  He does describe an abrupt change in his ability to do activity roughly 3 to 4 weeks ago. On AC for A flutter.  -If this is negative he can go home today. -Euvolemic on examination.  Transition to torsemide 20 mg daily.  Continue Aldactone 12.5 mg daily.  Torsemide will beneficial in the setting of right heart failure. -I think he does need a thorough work-up for pulmonary hypertension in the outpatient setting.  This will include a VQ scan, high-resolution chest CT, PFTs, right and left heart catheterization.  Given his extended hospitalization I see no need to delay this while he is here.  This can be pursued in the outpatient setting.  This could also be porto-pulmonary hypertension in the setting of cirrhosis.   2.  Typical atrial flutter -Status post TEE/DCCV on 11/08/2020. -Continue bisoprolol and Eliquis. -Uninterrupted DOAC therapy for at least 1 month.  3.  COPD -Smoking cessation advised.  Albuterol as needed. -He will need PFTs in the outpatient setting.  4.  Alcohol abuse/cirrhosis/Hyperammonemia  -Liver enzymes are improving. -Admitted with acute liver failure which was likely worsened by hepatic congestion in the setting of RV failure. -Symptoms are improving.  Have him on Aldactone as well as torsemide. -Advised to refrain from alcohol. -CIWA -He will need GI follow-up upon discharge. -Continue lactulose.  5. Tobacco abuse -advised to quit   FEN -no IVF -dvt ppx: eliquis -code: full -diet: salt  restricted   For questions or updates, please contact Page Please consult [email or web address] for contact info under   Time Spent with Patient: I have spent a total of 25 minutes with patient reviewing hospital notes, telemetry, EKGs, labs and examining the patient as well as establishing an assessment and plan that was discussed with the patient.  > 50% of time was spent in direct patient care.    Signed, Addison Naegeli. Audie Box, MD, [place]  11/13/2020 9:24 AM

## 2020-11-13 NOTE — Progress Notes (Signed)
Consult Progress Note    Jordan Kelly   YHC:[id number]  DOB: 08-23-1960  DOA: 11/04/2020     9  PCP: Noreene Larsson, NP  Reason for consult: rash, history of alcohol use  Hospital Course: Jordan Kelly is a 60 y.o. male with PMH tobacco and alcohol abuse who initially presented with complaints of constipation, fatigue, and edema on 3/26.  Patient was initially found to be in atrial flutter.  Labs were significant for BUN 46, creatinine 1.28, AST 70, ALT 99, and BNP 1119.   Patient was initially started on Cardizem drip, but was transitioned to bisoprolol and tolerated well.  He has a history of drinking approx 6-pack beer daily. 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  He appears disheveled/unkempt when evaluated.  There were reports of patient having a pruritic rash notably on his right thigh and also a more pronounced rash in his groin region.  Rash was noted to have worsened recently with heat exposure. He also has been constipated and was given a dose of lactulose with good response. He also underwent TEE DCCV on 11/08/2020.  Interval History:  No events overnight.  Walk test performed yesterday.  Down to 87% ambulating and was put on 2 L.  He is on room air this morning. Hopeful for going home today he says if able.  Overall rashes have improved.  He shows me a rash on his ankle which is very classic with psoriasis.  He does carry this diagnosis he says but does not typically use creams.  Rash on his hip is also very consistent now with psoriasis appearance.  ROS: Constitutional: negative for chills and fevers, Respiratory: negative for cough, Cardiovascular: negative and Gastrointestinal: negative for abdominal pain  Assessment & Plan:  Atrial flutter - s/p TEE DCCV on 3/30; now back to NSR -Continue bisoprolol - management per cardiology   HFrEF - Patient still with some lower extremity edema.  BNP was initially 1119 echocardiogram revealed EF of 45 to 50%.   - Currently being diuresed with Lasix. - management per cardiology/primary team  Acute hypoxic respiratory failure -Likely component of underlying volume overload/CHF as well as atelectasis from being in bed for prolonged amount of time -Has had spirometer ordered -Wean oxygen as able. May still need home O2 prior to d/c if still de-satting with ambulation -Continue diuresis -Continue working with PT - follow up CTA chest to rule out PE  Contact dermatitis Cutaneous candidiasis Psoriasis - d/c nystatin; rash healed/improved greatly -Discontinue hydrocortisone and start triamcinolone cream.  Can continue this at discharge to treat further psoriasis rashes still in place -Instructed him to follow-up outpatient with a PCP for further follow-up and management.  He does not have a PCP he says, and does ask for assistance.  TOC consult placed  Ongoing alcohol abuse Transaminitis Alcoholic cirrhosis without ascites  - does not appear to be in any further w/d; last CIWA scores were minimal - change ativan to q4PRN for anxiety at this point; would not prescribe on discharge; maybe a short course of librium if still felt to be in any w/d but now hospitalized for 4-5 days - continue vitamin supplements - Hepatitis panel negative - RUQ u/s performed on 3/29, early signs of cirrhosis; patient heavily counseled for alcohol cessation  - continue lactulose; dicussed his BM goal bedside (2-3 soft formed BM's daily) - PCP can place referral to GI outpatient once he establishes care  Physical deconditioning -Continue with physical  therapy -Ambulate with assistance in hallway each shift  COPD Tobacco Use - hold off on steroids; no wheezing currently or concern for exacerbation  - continue nicotine patch and breathing treatments   Acute kidney injury, resolved  - baseline creatinine ~ 0.8 - patient presented with increase in creat >0.3 mg/dL above baseline - probable CRS vs arrhythmia  mediated -Renal function responding to diuresis -BMP daily  Constipation, resolved  - continue lactulose   Old records reviewed in assessment of this patient  Antimicrobials: n/a  DVT prophylaxis:  apixaban (ELIQUIS) tablet 5 mg   Code Status:   Code Status: Full Code  Objective: Blood pressure 109/75, pulse 72, temperature 98.1 F (36.7 C), temperature source Oral, resp. rate 18, height 5\' 10"  (1.778 m), weight 76.4 kg, SpO2 91 %.  Examination: General appearance: Awake, alert, no acute distress.  Energy improving daily Head: Normocephalic, without obvious abnormality, atraumatic Eyes: PERRL, EOMI Lungs: clear to auscultation bilaterally Heart: regular rate and rhythm and S1, S2 normal Abdomen: Bowel sounds improved.  Nontender.  Obese. Extremities:  Improving rash in groin region, essentially healed. Has classic appearing psoriasis rash noted on R hip and ankle Skin: mobility and turgor normal Neurologic: overall improved; no focal deficits   Data Reviewed: I have personally reviewed following labs and imaging studies Results for orders placed or performed during the hospital encounter of 11/04/20 (from the past 24 hour(s))  CBC with Differential/Platelet     Status: None   Collection Time: 11/13/20  7:21 AM  Result Value Ref Range   WBC 8.1 4.0 - 10.5 K/uL   RBC 5.07 4.22 - 5.81 MIL/uL   Hemoglobin 16.0 13.0 - 17.0 g/dL   HCT 49.8 39.0 - 52.0 %   MCV 98.2 80.0 - 100.0 fL   MCH 31.6 26.0 - 34.0 pg   MCHC 32.1 30.0 - 36.0 g/dL   RDW 13.2 11.5 - 15.5 %   Platelets 268 150 - 400 K/uL   nRBC 0.0 0.0 - 0.2 %   Neutrophils Relative % 71 %   Neutro Abs 5.9 1.7 - 7.7 K/uL   Lymphocytes Relative 16 %   Lymphs Abs 1.3 0.7 - 4.0 K/uL   Monocytes Relative 8 %   Monocytes Absolute 0.7 0.1 - 1.0 K/uL   Eosinophils Relative 3 %   Eosinophils Absolute 0.2 0.0 - 0.5 K/uL   Basophils Relative 1 %   Basophils Absolute 0.1 0.0 - 0.1 K/uL   Immature Granulocytes 1 %   Abs  Immature Granulocytes 0.04 0.00 - 0.07 K/uL  Magnesium     Status: None   Collection Time: 11/13/20  7:21 AM  Result Value Ref Range   Magnesium 1.7 1.7 - 2.4 mg/dL  Comprehensive metabolic panel     Status: Abnormal   Collection Time: 11/13/20  7:21 AM  Result Value Ref Range   Sodium 139 135 - 145 mmol/L   Potassium 3.6 3.5 - 5.1 mmol/L   Chloride 94 (L) 98 - 111 mmol/L   CO2 37 (H) 22 - 32 mmol/L   Glucose, Bld 102 (H) 70 - 99 mg/dL   BUN 19 6 - 20 mg/dL   Creatinine, Ser 0.73 0.61 - 1.24 mg/dL   Calcium 9.0 8.9 - 10.3 mg/dL   Total Protein 6.1 (L) 6.5 - 8.1 g/dL   Albumin 2.6 (L) 3.5 - 5.0 g/dL   AST 31 15 - 41 U/L   ALT 35 0 - 44 U/L   Alkaline Phosphatase 82 38 -  126 U/L   Total Bilirubin 1.8 (H) 0.3 - 1.2 mg/dL   GFR, Estimated >60 >60 mL/min   Anion gap 8 5 - 15    Recent Results (from the past 240 hour(s))  SARS CORONAVIRUS 2 (TAT 6-24 HRS) Nasopharyngeal Nasopharyngeal Swab     Status: None   Collection Time: 11/04/20  3:01 PM   Specimen: Nasopharyngeal Swab  Result Value Ref Range Status   SARS Coronavirus 2 NEGATIVE NEGATIVE Final    Comment: (NOTE) SARS-CoV-2 target nucleic acids are NOT DETECTED.  The SARS-CoV-2 RNA is generally detectable in upper and lower respiratory specimens during the acute phase of infection. Negative results do not preclude SARS-CoV-2 infection, do not rule out co-infections with other pathogens, and should not be used as the sole basis for treatment or other patient management decisions. Negative results must be combined with clinical observations, patient history, and epidemiological information. The expected result is Negative.  Fact Sheet for Patients: SugarRoll.be  Fact Sheet for Healthcare Providers: https://www.woods-mathews.com/  This test is not yet approved or cleared by the [place] FDA and  has been authorized for detection and/or diagnosis of SARS-CoV-2 by FDA under an  Emergency Use Authorization (EUA). This EUA will remain  in effect (meaning this test can be used) for the duration of the COVID-19 declaration under Se ction 564(b)(1) of the Act, 21 U.S.C. section 360bbb-3(b)(1), unless the authorization is terminated or revoked sooner.  Performed at [facility], [place] [address] [place], [place] [address]   Blood culture (routine x 2)     Status: None   Collection Time: 11/04/20  3:44 PM   Specimen: Left Antecubital; Blood  Result Value Ref Range Status   Specimen Description   Final    LEFT ANTECUBITAL BOTTLES DRAWN AEROBIC AND ANAEROBIC   Special Requests Blood Culture adequate volume  Final   Culture   Final    NO GROWTH 5 DAYS Performed at [facility], [address] [place], [place] [address]    Report Status 11/09/2020 FINAL  Final  Blood culture (routine x 2)     Status: None   Collection Time: 11/04/20  3:46 PM   Specimen: BLOOD RIGHT ARM  Result Value Ref Range Status   Specimen Description   Final    BLOOD RIGHT ARM BOTTLES DRAWN AEROBIC AND ANAEROBIC   Special Requests Blood Culture adequate volume  Final   Culture   Final    NO GROWTH 5 DAYS Performed at [facility], [address], [place], [place] [address]    Report Status 11/09/2020 FINAL  Final  MRSA PCR Screening     Status: None   Collection Time: 11/04/20 10:55 PM   Specimen: Nasopharyngeal  Result Value Ref Range Status   MRSA by PCR NEGATIVE NEGATIVE Final    Comment:        The GeneXpert MRSA Assay (FDA approved for NASAL specimens only), is one component of a comprehensive MRSA colonization surveillance program. It is not intended to diagnose MRSA infection nor to guide or monitor treatment for MRSA infections. Performed at [facility], [place] [address] [place], [place] [address]      Radiology Studies: No results found. DG CHEST PORT 1 VIEW  Final Result    US Abdomen Limited  Final Result    CT HEAD WO CONTRAST  Final Result     DG Chest Port 1 View  Final Result    CT ANGIO CHEST PE W OR  WO CONTRAST    (Results Pending)    Scheduled Meds: . apixaban  5 mg Oral BID  . bisoprolol  5 mg Oral Daily  . Chlorhexidine Gluconate Cloth  6 each Topical Q0600  . feeding supplement  1 Container Oral TID BM  . folic acid  1 mg Oral Daily  . influenza vac split quadrivalent PF  0.5 mL Intramuscular Tomorrow-1000  . lactulose  20 g Oral BID  . mouth rinse  15 mL Mouth Rinse BID  . multivitamin with minerals  1 tablet Oral Daily  . nicotine  21 mg Transdermal Daily  . pneumococcal 23 valent vaccine  0.5 mL Intramuscular Tomorrow-1000  . spironolactone  12.5 mg Oral Daily  . thiamine  100 mg Oral Daily  . torsemide  20 mg Oral Daily  . triamcinolone cream   Topical BID   PRN Meds: acetaminophen, albuterol, LORazepam, magnesium hydroxide, ondansetron (ZOFRAN) IV Continuous Infusions:    LOS: 9 days  Time spent: Greater than 50% of the 35 minute visit was spent in counseling/coordination of care for the patient as laid out in the A&P.   Dwyane Dee, MD [facility] 11/13/2020, 11:22 AM

## 2020-11-13 NOTE — Consult Note (Signed)
   [facility] Inpatient Consult   11/13/2020  SHAUNAK KREIS [birth year] [id number]  [facility] [ACO] Patient:  [facility]  Primary Care Provider [PCP]:  Demetrius Revel, NP, [facility] an Embedded practice, which also is listed for the transition of care follow up.  Patient was screened for [facility] services.  This Embedded practice  has a chronic disease management Embedded Care Management team. Spoke with the patient in the room patient sitting in chair.  Explained follow up with primary care provider's office.  Patient states he has a scooter and that's how he gets around in [place] but if he has appointment elsewhere he may need transportation information. Patient in agreement for follow up with PCP. Patient also states he may need assistance with medications depending on what they put him on.  Will follow up with inpatient TOC regarding this potential need.  Plan: Will send a referral to the [facility] and made aware of above needs.   Please contact for further questions,  Natividad Brood, RN BSN [facility] Liaison  [phone] business mobile phone Toll free office [phone]  Fax number: [phone] [email or web address]

## 2020-11-14 ENCOUNTER — Other Ambulatory Visit (HOSPITAL_COMMUNITY): Payer: Self-pay

## 2020-11-14 DIAGNOSIS — K7460 Unspecified cirrhosis of liver: Secondary | ICD-10-CM

## 2020-11-14 DIAGNOSIS — Q211 Atrial septal defect: Secondary | ICD-10-CM

## 2020-11-14 DIAGNOSIS — L409 Psoriasis, unspecified: Secondary | ICD-10-CM

## 2020-11-14 DIAGNOSIS — Q2112 Patent foramen ovale: Secondary | ICD-10-CM

## 2020-11-14 DIAGNOSIS — I4892 Unspecified atrial flutter: Secondary | ICD-10-CM | POA: Diagnosis not present

## 2020-11-14 DIAGNOSIS — N179 Acute kidney failure, unspecified: Secondary | ICD-10-CM

## 2020-11-14 LAB — BASIC METABOLIC PANEL
Anion gap: 10 (ref 5–15)
BUN: 27 mg/dL — ABNORMAL HIGH (ref 6–20)
CO2: 39 mmol/L — ABNORMAL HIGH (ref 22–32)
Calcium: 9.1 mg/dL (ref 8.9–10.3)
Chloride: 90 mmol/L — ABNORMAL LOW (ref 98–111)
Creatinine, Ser: 0.93 mg/dL (ref 0.61–1.24)
GFR, Estimated: >60 mL/min (ref >60)
Glucose, Bld: 100 mg/dL — ABNORMAL HIGH (ref 70–99)
Potassium: 3.6 mmol/L (ref 3.5–5.1)
Sodium: 139 mmol/L (ref 135–145)

## 2020-11-14 LAB — CBC WITH DIFFERENTIAL/PLATELET
Abs Immature Granulocytes: 0.04 10*3/uL (ref 0.00–0.07)
Basophils Absolute: 0.1 10*3/uL (ref 0.0–0.1)
Basophils Relative: 1 %
Eosinophils Absolute: 0.2 10*3/uL (ref 0.0–0.5)
Eosinophils Relative: 2 %
HCT: 49.6 % (ref 39.0–52.0)
Hemoglobin: 16.2 g/dL (ref 13.0–17.0)
Immature Granulocytes: 0 %
Lymphocytes Relative: 19 %
Lymphs Abs: 1.9 10*3/uL (ref 0.7–4.0)
MCH: 31.8 pg (ref 26.0–34.0)
MCHC: 32.7 g/dL (ref 30.0–36.0)
MCV: 97.4 fL (ref 80.0–100.0)
Monocytes Absolute: 0.9 10*3/uL (ref 0.1–1.0)
Monocytes Relative: 9 %
Neutro Abs: 6.7 10*3/uL (ref 1.7–7.7)
Neutrophils Relative %: 69 %
Platelets: 273 10*3/uL (ref 150–400)
RBC: 5.09 MIL/uL (ref 4.22–5.81)
RDW: 13.2 % (ref 11.5–15.5)
WBC: 9.7 10*3/uL (ref 4.0–10.5)
nRBC: 0 % (ref 0.0–0.2)

## 2020-11-14 LAB — MAGNESIUM: Magnesium: 1.8 mg/dL (ref 1.7–2.4)

## 2020-11-14 MED ORDER — TORSEMIDE 20 MG PO TABS
20.0000 mg | ORAL_TABLET | Freq: Every day | ORAL | Status: DC
  Administered 2020-11-14: 20 mg via ORAL
  Filled 2020-11-14: qty 1

## 2020-11-14 MED ORDER — NICOTINE 21 MG/24HR TD PT24
21.0000 mg | MEDICATED_PATCH | Freq: Every day | TRANSDERMAL | 0 refills | Status: DC
  Filled 2020-11-14: qty 28, 28d supply, fill #0

## 2020-11-14 MED ORDER — SPIRONOLACTONE 25 MG PO TABS
12.5000 mg | ORAL_TABLET | Freq: Every day | ORAL | 2 refills | Status: DC
  Filled 2020-11-14: qty 45, 90d supply, fill #0

## 2020-11-14 MED ORDER — TRIAMCINOLONE ACETONIDE 0.5 % EX CREA
TOPICAL_CREAM | Freq: Two times a day (BID) | CUTANEOUS | 0 refills | Status: DC
  Filled 2020-11-14: qty 30, 15d supply, fill #0

## 2020-11-14 MED ORDER — ACETAZOLAMIDE 250 MG PO TABS
500.0000 mg | ORAL_TABLET | Freq: Once | ORAL | Status: AC
  Administered 2020-11-14: 500 mg via ORAL
  Filled 2020-11-14: qty 2

## 2020-11-14 MED ORDER — TORSEMIDE 20 MG PO TABS
20.0000 mg | ORAL_TABLET | Freq: Every day | ORAL | 2 refills | Status: DC
  Filled 2020-11-14: qty 90, 90d supply, fill #0

## 2020-11-14 MED ORDER — BISOPROLOL FUMARATE 5 MG PO TABS
5.0000 mg | ORAL_TABLET | Freq: Every day | ORAL | 2 refills | Status: DC
  Filled 2020-11-14: qty 90, 90d supply, fill #0

## 2020-11-14 MED ORDER — APIXABAN 5 MG PO TABS
5.0000 mg | ORAL_TABLET | Freq: Two times a day (BID) | ORAL | 2 refills | Status: DC
  Filled 2020-11-14: qty 60, 30d supply, fill #0

## 2020-11-14 NOTE — Discharge Summary (Signed)
Discharge Summary    Patient ID: Jordan Kelly MRN: [id number]; DOB: 1961/08/09  Admit date: 11/04/2020 Discharge date: 11/14/2020  PCP:  Noreene Larsson, NP   [place]  Cardiologist: Needs to follow in [place], [place]  [id number]}  Discharge Diagnoses    Principal Problem:   Atrial flutter with rapid ventricular response ([place]) Active Problems:   Tobacco abuse   Alcohol abuse   Obstructive chronic bronchitis without exacerbation ([place])   Congestive heart failure (CHF) (HCC)   CHF (congestive heart failure) (HCC)   Head trauma   Acute cor pulmonale (HCC)   PFO (patent foramen ovale)   Cirrhosis ([place])   Psoriasis   AKI (acute kidney injury) ([place])  Diagnostic Studies/Procedures    TEE/DCCV 11/08/20:  Transesophageal Echocardiogram  Indications: ?ASD  Time out performed  Findings:  Left Ventricle: Normal EF 65%  Mitral Valve: Normal, trace MR  Aortic Valve: Normal, trileaflet  Tricuspid Valve: Normal   Left Atrium: No LAA thrombus. Normal PV entry. Normal coronary sinus  Atrial septum: there is right to left color flow Doppler shunt with mid septal secundum ASD. There is adequate landing zone for closure device.   Bubble Contrast Study: Positive R to L shunt early.   ---------------------------------------------------------------------------------------------------------  Electrical Cardioversion Procedure Note Jordan Kelly [id number] 12/19/1960  Procedure: Electrical Cardioversion Indications:  Atrial Flutter  Time Out: Verified patient identification, verified procedure,medications/allergies/relevent history reviewed, required imaging and test results available.  Performed  Procedure Details  The patient was NPO after midnight. Anesthesia was administered at the beside  with propofol.  Cardioversion was performed with synchronized biphasic defibrillation via AP pads with 120 joules.  1 attempt(s) were performed.  The  patient converted to normal sinus rhythm. The patient tolerated the procedure well   IMPRESSION:  Successful cardioversion of atrial flutter. _____________  Echocardiogram  11/05/2020: 1. Left ventricular ejection fraction, by estimation, is 45 to 50%. The  left ventricle has mildly decreased function. The left ventricle  demonstrates global hypokinesis. Left ventricular diastolic function could  not be evaluated. There is the  interventricular septum is flattened in diastole ('D' shaped left  ventricle), consistent with right ventricular volume overload. The average  left ventricular global longitudinal strain is -12.0 %. The global  longitudinal strain is abnormal.  2. Right ventricular systolic function is moderately reduced. The right  ventricular size is moderately enlarged. There is mildly elevated  pulmonary artery systolic pressure.  3. Right atrial size was severely dilated.  4. The mitral valve is normal in structure. No evidence of mitral valve  regurgitation.  5. The aortic valve is tricuspid. Aortic valve regurgitation is not  visualized. No aortic stenosis is present.  6. The inferior vena cava is dilated in size with <50% respiratory  variability, suggesting right atrial pressure of 15 mmHg.  7. Evidence of atrial level shunting detected by color flow Doppler.  There is a small secundum atrial septal defect with bidirectional shunting  across the atrial septum.   History of Present Illness     Jordan Kelly is a 60 y.o. male with a hx of heavy alcohol and tobacco use along with COPD use who was admitted 11/04/20 acute right heart failure and atrial flutter. He underwent TEE/cardioversion on 11/08/2020.    He was initially seen at an outside hospital for fatigue and edema, found to be in new onset atrial flutter therefore was transferred to [facility] for further workup. He has a hx of heavy alcohol abuse  and reported a one week hx of increased fatigue with a decrease  in his urine output.   On hospital presentation his labs showed a low albumin and elevated liver enzymes along with an elevated ammonia level and elevated BNP. He was admitted to cardiology service for the evaluation of atrial flutter.    Hospital Course     He was started on anticoagulation and a diltiazem infusion with improved rate control with plans for potential TEE/DCCV given HF symptoms. Echocardiogram was performed out of concern for tachycardia induced cardiomyopathy verus alcohol induced cardiomyopathy. Echo showed a disproportionate RV versus LV enlargement with RV volume/pressure overload. He was initially felt to have an  atrial septal defect with bidirectional shunting with small/med ASD being the initial abnormality leading to right atrial dilation and secondary atrial flutter with subsequent mild tachycardia-related LV cardiomyopathy. However after further review by Dr. Audie Box, he confirmed a PFO with right to left shunting due to elevated right heart pressures along with cor pulmonale which had not yet been worked up. Unfortunately R/LHC felt to be difficult as he had undergone cardioversion and would therefore need to wait 1 month and have this performed in the OP setting. CT PE study was obtained that was negative for acute PE. Plan was for thorough PH workup to include a VQ scan, high-resolution chest CT, PFTs, along with right and left heart catheterization.   He was diuresed with IV lasix due to marked fluid volume overload. He was started on [facility] and rate controlling medications with bisoprolol due to pumonary disease. TEE/DCCV was performed 11/08/20 that showed an EF at 65% with trace MR, trileaflet aortic valve, no LAA thrombus and confirmed right to left color flow Doppler shunt with mid septal secundum ASD>>however see Dr. Kathalene Frames interpretation as above. There was an adequate landing zone for closure device. He was cardioverted to NSR with 120J and tolerated the procedure well.    He was placed on CIWA protocol due to heavy alcohol abuse and unfortunately suffered a fall during his hospital course. STAT head CT ordered that showed no acute abnormalities. Follow up head CT was also negative therefore his AC was resumed without complication. RUQ US performed 11/07/20 that showed early changes consistent with hepatic cirrhosis. Follow up ammonia was more stable from 76>>32 with lactulose. His LFTs stabilized as well and were within normal limits on day of discharge. Per IM, plan will be to continue him on Lactulose and will need referral by PCP for GI in the future.   Internal medicine service was consulted for assistance with COPD and difficulty weaning supplemental oxygen. He also developed a puritic rash felt to be secondary to psoriasis. Initially this was treated with nystatin cream however this was discontinued by IM with instructions to follow with his PCP for this. TOC consulted to assist with PCP OP referral.  THN discussed follow up with PCP>>patient understands and agrees   He worked with PT/OT and had desaturations with ambulation while on 2L [place] one day prior to d/c however this resolved by day of discharge as he was ambulating in the room on RA with stable saturations. He has been recommended for a walker for ambulation at d/c.    Hospital problems include:  Pulmonary hypertension/RV failure/PFO with right to left shunting -Echocardiogram and TEE reviewed by Dr. Barron Schmid felt to have a secundum ASD>>rather a PFO with right to left shunting due to elevated right heart pressures along with cor pulmonale. Unfortunately right left heart cath felt to  be difficult as he underwent DCCV and will need to wait 1 month and have to do this as an outpatient. -CT PE study was negative this admission. -Transitioned from IV Lasix to torsemide 20 mg daily with continuation of . Aldactone 12.5 mg daily.  -Plan for pulmonary hypertension workup in the OP setting should include VQ  scan, high-resolution chest CT, PFTs, right and left heart catheterization>>no urgency in the inpatient setting given his prolonged hospitalization.   Typical atrial flutter -S/p TEE/DCCV on 11/08/2020 to NSR  -Continue bisoprolol and Eliquis. -Uninterrupted DOAC therapy for at least 1 month   Acute hypoxic respiratory failure/COPD -Likely component of underlying volume overload/CHF as well as atelectasis from being in bed for prolonged amount of time -He will need PFTs in the outpatient setting.  Alcohol abuse/cirrhosis/hyperammonemia  -Liver enzymes normalized  -Admitted with acute liver failure which was likely worsened by hepatic congestion in the setting of RV failure. -Symptoms are improving by day of discharge  -Continue Aldactone as well as torsemide -Advised to refrain from alcohol -Needs GI follow-up upon discharge  Tobacco abuse -Cessation strongly encouraged   Contact dermatitis/cutaneous candidiasis/psoriasis -d/c nystatin; rash healed/improved greatly -Discontinue hydrocortisone and start triamcinolone cream>>continue at discharge to treat further psoriasis rashes still in place -Instructed him to follow-up outpatient with a PCP for further follow-up and management.  He does not have a PCP he says, and does ask for assistance.  TOC consult placed  Acute kidney injury, resolved  -Baseline creatinine ~ 0.8 -Probable CRS vs arrhythmia mediated -Renal function responded to diuresis  Consultants: Internal medicine; [facility]   The patient has been seen and examined by Dr. Audie Box who feels that his is stable and ready for discharge today  Did the patient have an acute coronary syndrome (MI, NSTEMI, STEMI, etc) this admission?:  No                               Did the patient have a percutaneous coronary intervention (stent / angioplasty)?:  No.     _____________  Discharge Vitals Blood pressure 106/76, pulse 76, temperature 97.9 F (36.6 C), temperature source Oral,  resp. rate (!) 22, height 5\' 10"  (1.778 m), weight 74.4 kg, SpO2 90 %.  Filed Weights   11/12/20 0433 11/13/20 0430 11/14/20 0024  Weight: 78.5 kg 76.4 kg 74.4 kg    Labs & Radiologic Studies    CBC Recent Labs    11/13/20 0721 11/14/20 0404  WBC 8.1 9.7  NEUTROABS 5.9 6.7  HGB 16.0 16.2  HCT 49.8 49.6  MCV 98.2 97.4  PLT 268 220   Basic Metabolic Panel Recent Labs    11/13/20 0721 11/14/20 0404  NA 139 139  K 3.6 3.6  CL 94* 90*  CO2 37* 39*  GLUCOSE 102* 100*  BUN 19 27*  CREATININE 0.73 0.93  CALCIUM 9.0 9.1  MG 1.7 1.8   Liver Function Tests Recent Labs    11/12/20 0345 11/13/20 0721  AST 28 31  ALT 34 35  ALKPHOS 74 82  BILITOT 1.3* 1.8*  PROT 6.0* 6.1*  ALBUMIN 2.5* 2.6*   No results for input(s): LIPASE, AMYLASE in the last 72 hours. High Sensitivity Troponin:   No results for input(s): TROPONINIHS in the last 720 hours.  BNP Invalid input(s): POCBNP D-Dimer No results for input(s): DDIMER in the last 72 hours. Hemoglobin A1C No results for input(s): HGBA1C in the last 72 hours.  Fasting Lipid Panel No results for input(s): CHOL, HDL, LDLCALC, TRIG, CHOLHDL, LDLDIRECT in the last 72 hours. Thyroid Function Tests No results for input(s): TSH, T4TOTAL, T3FREE, THYROIDAB in the last 72 hours.  Invalid input(s): FREET3 _____________  CT HEAD WO CONTRAST  Result Date: 11/07/2020 CLINICAL DATA:  Fall with head trauma.  On Eliquis. EXAM: CT HEAD WITHOUT CONTRAST TECHNIQUE: Contiguous axial images were obtained from the base of the skull through the vertex without intravenous contrast. COMPARISON:  None. FINDINGS: The study is mildly motion degraded despite repeat imaging. Brain: There is no evidence of an acute infarct, intracranial hemorrhage, mass, midline shift, or extra-axial fluid collection. The ventricles and sulci are within normal limits for age. Vascular: No hyperdense vessel. Skull: No fracture or suspicious osseous lesion. Sinuses/Orbits:  Visualized paranasal sinuses are clear. Small bilateral mastoid effusions. Unremarkable orbits. Other: Possible mild left frontal scalp soft tissue swelling. IMPRESSION: No evidence of acute intracranial abnormality. Electronically Signed   By: Logan Bores M.D.   On: 11/07/2020 12:34   CT ANGIO CHEST PE W OR WO CONTRAST  Result Date: 11/13/2020 CLINICAL DATA:  Shortness of breath. EXAM: CT ANGIOGRAPHY CHEST WITH CONTRAST TECHNIQUE: Multidetector CT imaging of the chest was performed using the standard protocol during bolus administration of intravenous contrast. Multiplanar CT image reconstructions and MIPs were obtained to evaluate the vascular anatomy. CONTRAST:  7mL OMNIPAQUE IOHEXOL 350 MG/ML SOLN COMPARISON:  Chest CT 01/08/2019 FINDINGS: Cardiovascular: The heart is within normal limits in size. No pericardial effusion. The aorta is normal in caliber. No atherosclerotic calcifications. Scattered coronary artery calcifications are noted. The pulmonary arterial tree is fairly well opacified. No filling defects to suggest pulmonary embolism. Mediastinum/Nodes: Scattered borderline mediastinal and hilar lymph nodes appears stable when compared to the prior CT examination. The esophagus is grossly normal. Lungs/Pleura: There is a moderate-sized right pleural effusion with significant overlying atelectasis. Small left effusion is also noted. No findings for pulmonary edema. No obvious pulmonary lesions but study is somewhat limited by breathing motion artifact. Mild emphysematous changes are noted. I do not see any worrisome pulmonary lesions Upper Abdomen: No significant upper abdominal findings. Musculoskeletal: Exaggerated thoracic kyphosis and moderate degenerative changes involving the thoracic spine. There are remote appearing right rib fractures. No acute fractures are identified. Review of the MIP images confirms the above findings. IMPRESSION: 1. No CT findings for pulmonary embolism. 2. Normal  thoracic aorta.  Coronary artery calcifications noted. 3. Moderate-sized right pleural effusion with significant overlying atelectasis. 4. Small left effusion. 5. Stable borderline mediastinal and hilar lymph nodes but no mass or overt adenopathy. 6. Mild emphysematous changes. 7. Emphysema and aortic atherosclerosis. Aortic Atherosclerosis (ICD10-I70.0) and Emphysema (ICD10-J43.9). Electronically Signed   By: Marijo Sanes M.D.   On: 11/13/2020 13:06   US Abdomen Limited  Result Date: 11/07/2020 CLINICAL DATA:  Alcohol abuse.  Elevated LFTs. EXAM: ULTRASOUND ABDOMEN LIMITED RIGHT UPPER QUADRANT COMPARISON:  None. FINDINGS: Gallbladder: Partially distended. No gallstones. Mild wall thickening at 3 mm. No sonographic Murphy sign noted by sonographer. Common bile duct: Diameter: 3 mm, normal Liver: No focal lesion identified. Within normal limits in parenchymal echogenicity. There is questionable lobulated contours. Portal vein is patent on color Doppler imaging with normal direction of blood flow towards the liver. Other: No right upper quadrant ascites. IMPRESSION: 1. Questionable micro lobulated hepatic contours which can be seen with cirrhosis. No focal lesion. 2. Upper normal gallbladder wall thickness is likely related to degree of distension. No gallstones or sonographic  findings of acute cholecystitis. Electronically Signed   By: Keith Rake M.D.   On: 11/07/2020 19:18   DG CHEST PORT 1 VIEW  Result Date: 11/10/2020 CLINICAL DATA:  Shortness of breath Respiratory failure with hypoxia EXAM: PORTABLE CHEST 1 VIEW COMPARISON:  01/04/2021 FINDINGS: Mild cardiomegaly. Unchanged small right pleural effusion. Mild pulmonary vascular congestion. Focal airspace opacity in the right mid lung suspicious for pneumonia. IMPRESSION: Radiographic findings most consistent with CHF/fluid volume overload with small right pleural effusion. There is more focal airspace opacity at the right lung base which could  represent superimposed pneumonia. Electronically Signed   By: Miachel Roux M.D.   On: 11/10/2020 08:05   DG Chest Port 1 View  Result Date: 11/04/2020 CLINICAL DATA:  Shortness of breath. EXAM: PORTABLE CHEST 1 VIEW COMPARISON:  Radiograph and CT 01/08/2019 FINDINGS: Progressive right pleural effusion and pleuroparenchymal opacity at the right lung base from prior exam. The heart is mildly enlarged. There is mild left paratracheal soft tissue thickening. Bronchial thickening appears chronic. Possible scattered pulmonary nodules in both lungs. No pneumothorax. Remote appearing right rib fractures. IMPRESSION: 1. Progressive right pleural effusion and pleuroparenchymal opacity at the right lung base from 2020 exam. 2. Mild left paratracheal soft tissue thickening, may be vascular structures or adenopathy. 3. Possible scattered pulmonary nodules in both lungs. 4. Chest CT (with IV contrast) is recommended for further evaluation. Electronically Signed   By: Keith Rake M.D.   On: 11/04/2020 15:19   ECHO TEE  Result Date: 11/08/2020    TRANSESOPHOGEAL ECHO REPORT   Patient Name:   Jordan Kelly Date of Exam: 11/08/2020 Medical Rec #:  [id number]     Height:       70.0 in Accession #:    [id number]    Weight:       184.1 lb Date of Birth:  05/13/1961      BSA:          2.015 m Patient Age:    28 years      BP:           95/68 mmHg Patient Gender: M             HR:           105 bpm. Exam Location:  Inpatient Procedure: Transesophageal Echo, 3D Echo and Saline Contrast Bubble Study Indications:    Atrial Septal Defect  History:        Patient has prior history of Echocardiogram examinations, most                 recent 11/05/2020. COPD.  Sonographer:    Bernadene Person RDCS Referring Phys: [phone] MIHAI CROITORU PROCEDURE: After discussion of the risks and benefits of a TEE, an informed consent was obtained from the patient. The transesophogeal probe was passed without difficulty through the esophogus of the patient.  Sedation performed by different physician. The patient was monitored while under deep sedation. Anesthestetic sedation was provided intravenously by Anesthesiology: 290.12mg  of Propofol, 60mg  of Lidocaine. The patient's vital signs; including heart rate, blood pressure, and oxygen saturation; remained stable throughout the procedure. The patient developed no complications during the procedure. A successful direct current cardioversion was performed at 120 joules with 1 attempt. IMPRESSIONS  1. Left ventricular ejection fraction, by estimation, is 45 to 50%. The left ventricle has mildly decreased function. The left ventricle demonstrates global hypokinesis.  2. Evidence of atrial level shunting detected by color flow Doppler. Agitated saline contrast bubble  study was positive with shunting observed within 3-6 cardiac cycles suggestive of interatrial shunt. 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 Diameter of ASD is approximately 0.5cm. There is 1.8 cm landing zone on either side of ASD. Normal coronary sinus/ PV drainage.  3. Right ventricular systolic function is moderately reduced. The right ventricular size is mildly enlarged.  4. Left atrial size was moderately dilated. No left atrial/left atrial appendage thrombus was detected.  5. Right atrial size was severely dilated.  6. The mitral valve is normal in structure. No evidence of mitral valve regurgitation. No evidence of mitral stenosis.  7. The aortic valve is normal in structure. Aortic valve regurgitation is not visualized. No aortic stenosis is present.  8. The inferior vena cava is normal in size with greater than 50% respiratory variability, suggesting right atrial pressure of 3 mmHg.  9. Secundum ASD. FINDINGS  Left Ventricle: Left ventricular ejection fraction, by estimation, is 45 to 50%. The left ventricle has mildly decreased function. The left ventricle demonstrates global  hypokinesis. The left ventricular internal cavity size was normal in size. There is  no left ventricular hypertrophy. Right Ventricle: The right ventricular size is mildly enlarged. No increase in right ventricular wall thickness. Right ventricular systolic function is moderately reduced. Left Atrium: Left atrial size was moderately dilated. No left atrial/left atrial appendage thrombus was detected. Right Atrium: Right atrial size was severely dilated. Prominent Eustachian valve. Pericardium: There is no evidence of pericardial effusion. Mitral Valve: The mitral valve is normal in structure. No evidence of mitral valve regurgitation. No evidence of mitral valve stenosis. Tricuspid Valve: The tricuspid valve is normal in structure. Tricuspid valve regurgitation is not demonstrated. No evidence of tricuspid stenosis. Aortic Valve: The aortic valve is normal in structure. Aortic valve regurgitation is not visualized. No aortic stenosis is present. Pulmonic Valve: The pulmonic valve was normal in structure. Pulmonic valve regurgitation is not visualized. No evidence of pulmonic stenosis. Aorta: The aortic root is normal in size and structure. Venous: The inferior vena cava is normal in size with greater than 50% respiratory variability, suggesting right atrial pressure of 3 mmHg. IAS/Shunts: Evidence of atrial level shunting detected by color flow Doppler. Agitated saline contrast was given intravenously to evaluate for intracardiac shunting. Agitated saline contrast bubble study was positive with shunting observed within 3-6 cardiac cycles suggestive of interatrial shunt. 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 Additional Comments: Secundum ASD. Candee Furbish MD Electronically signed by Candee Furbish MD Signature Date/Time: 11/08/2020/3:29:48 PM    Final    ECHOCARDIOGRAM COMPLETE BUBBLE STUDY  Result Date: 11/05/2020    ECHOCARDIOGRAM REPORT    Patient Name:   Jordan Kelly Date of Exam: 11/05/2020 Medical Rec #:  [id number]     Height:       70.0 in Accession #:    [id number]    Weight:       184.5 lb Date of Birth:  1961-04-17      BSA:          2.017 m Patient Age:    41 years      BP:           105/80 mmHg Patient Gender: M             HR:           76 bpm. Exam Location:  Inpatient Procedure:  2D Echo, Cardiac Doppler, Color Doppler, 3D Echo and Saline Contrast            Bubble Study Indications:    Atrial Flutter I48.92  History:        Patient has no prior history of Echocardiogram examinations.                 COPD; Risk Factors:Current Smoker.  Sonographer:    Darlina Sicilian RDCS Referring Phys: [phone] MIHAI CROITORU  Sonographer Comments: Global longitudinal strain was attempted. IMPRESSIONS  1. Left ventricular ejection fraction, by estimation, is 45 to 50%. The left ventricle has mildly decreased function. The left ventricle demonstrates global hypokinesis. Left ventricular diastolic function could not be evaluated. There is the interventricular septum is flattened in diastole ('D' shaped left ventricle), consistent with right ventricular volume overload. The average left ventricular global longitudinal strain is -12.0 %. The global longitudinal strain is abnormal.  2. Right ventricular systolic function is moderately reduced. The right ventricular size is moderately enlarged. There is mildly elevated pulmonary artery systolic pressure.  3. Right atrial size was severely dilated.  4. The mitral valve is normal in structure. No evidence of mitral valve regurgitation.  5. The aortic valve is tricuspid. Aortic valve regurgitation is not visualized. No aortic stenosis is present.  6. The inferior vena cava is dilated in size with <50% respiratory variability, suggesting right atrial pressure of 15 mmHg.  7. Evidence of atrial level shunting detected by color flow Doppler. There is a small secundum atrial septal defect with bidirectional shunting across  the atrial septum. Comparison(s): No prior Echocardiogram. Conclusion(s)/Recommendation(s): Transesophageal echocardiogram to assess the atrial septal defect. FINDINGS  Left Ventricle: Left ventricular ejection fraction, by estimation, is 45 to 50%. The left ventricle has mildly decreased function. The left ventricle demonstrates global hypokinesis. The average left ventricular global longitudinal strain is -12.0 %. The global longitudinal strain is abnormal. The left ventricular internal cavity size was normal in size. There is no left ventricular hypertrophy. The interventricular septum is flattened in diastole ('D' shaped left ventricle), consistent with right ventricular volume overload. Left ventricular diastolic function could not be evaluated due to atrial fibrillation. Left ventricular diastolic function could not be evaluated. Right Ventricle: The right ventricular size is moderately enlarged. No increase in right ventricular wall thickness. Right ventricular systolic function is moderately reduced. There is mildly elevated pulmonary artery systolic pressure. The tricuspid regurgitant velocity is 2.58 m/s, and with an assumed right atrial pressure of 15 mmHg, the estimated right ventricular systolic pressure is 42.7 mmHg. Left Atrium: Left atrial size was normal in size. Right Atrium: Right atrial size was severely dilated. Pericardium: There is no evidence of pericardial effusion. Mitral Valve: The mitral valve is normal in structure. No evidence of mitral valve regurgitation. Tricuspid Valve: The tricuspid valve is normal in structure. Tricuspid valve regurgitation is mild. Aortic Valve: The aortic valve is tricuspid. Aortic valve regurgitation is not visualized. No aortic stenosis is present. Pulmonic Valve: The pulmonic valve was normal in structure. Pulmonic valve regurgitation is trivial. Aorta: The aortic root and ascending aorta are structurally normal, with no evidence of dilitation. Venous: The  inferior vena cava is dilated in size with less than 50% respiratory variability, suggesting right atrial pressure of 15 mmHg. IAS/Shunts: Evidence of atrial level shunting detected by color flow Doppler. Agitated saline contrast was given intravenously to evaluate for intracardiac shunting. There is a small secundum atrial septal defect with bidirectional shunting across the atrial septum.  LEFT VENTRICLE PLAX 2D LVIDd:         5.30 cm LVIDs:         4.40 cm  2D Longitudinal Strain LV PW:         0.70 cm  2D Strain GLS Avg:     -12.0 % LV IVS:        0.70 cm LVOT diam:     2.00 cm LV SV:         47 LV SV Index:   24 LVOT Area:     3.14 cm  RIGHT VENTRICLE RV S prime:     7.40 cm/s RVOT diam:      2.54 cm TAPSE (M-mode): 1.1 cm LEFT ATRIUM             Index       RIGHT ATRIUM           Index LA diam:        3.60 cm 1.78 cm/m  RA Area:     28.56 cm LA Vol (A2C):   38.1 ml 18.89 ml/m RA Volume:   52.60 ml  26.08 ml/m LA Vol (A4C):   46.4 ml 23.00 ml/m LA Biplane Vol: 43.3 ml 21.47 ml/m  AORTIC VALVE             PULMONIC VALVE LVOT Vmax:   97.90 cm/s  RVOT Peak grad: 3 mmHg LVOT Vmean:  67.000 cm/s LVOT VTI:    0.151 m  AORTA Ao Root diam: 3.70 cm Ao Asc diam:  3.20 cm TRICUSPID VALVE TV VTI:         0.19 msec TR Peak grad:   26.6 mmHg TR Vmax:        258.00 cm/s  SHUNTS Systemic VTI:  0.15 m Systemic Diam: 2.00 cm Pulmonic VTI:  0.132 m Pulmonic Diam: 2.54 cm Qp/Qs:         1.41 Mihai Croitoru MD Electronically signed by Sanda Klein MD Signature Date/Time: 11/05/2020/11:57:24 AM    Final    Disposition   Pt is being discharged home today in good condition.  Follow-up Plans & Appointments    Follow-up Information    Noreene Larsson, NP On 11/28/2020.   Specialty: Nurse Practitioner Why: @3 :20pm Contact information: [address]  [address] [place] [address] [phone]        Erma Heritage, PA-C Follow up on 11/29/2020.   Specialties: Physician Assistant, Cardiology Why: at  3:30pm  Contact information: [place] [address] [phone]              Discharge Instructions    ([place]) Call MD:  Anytime you have any of the following symptoms: 1) 3 pound weight gain in 24 hours or 5 pounds in 1 week 2) shortness of breath, with or without a dry hacking cough 3) swelling in the hands, feet or stomach 4) if you have to sleep on extra pillows at night in order to breathe.   Complete by: As directed    AMB Referral to [facility]   Complete by: As directed    Embedded CM with [facility]: Demetrius Revel, NP, PCP  Please assign to [facility] Coordinator for complex care and disease management follow up calls and assess for further needs.  Questions please call:   Natividad Brood, RN BSN [facility] Liaison  [phone] business mobile phone Toll free office [phone]  Fax number: [phone]  [email or web address]   Reason for Referral: Embedded Chronic Care Management Services (Dept. specific)   Disease management services needed: Nurse Case Manager   Diagnoses of: Other   Other Diagnosis: heart disease; ETOH   Expected date of contact: Routine - 30 Days   Call MD for:  difficulty breathing, headache or visual disturbances   Complete by: As directed    Call MD for:  extreme fatigue   Complete by: As directed    Call MD for:  hives   Complete by: As directed    Call MD for:  persistant dizziness or light-headedness   Complete by: As directed    Call MD for:  persistant nausea and vomiting   Complete by: As directed    Call MD for:  redness, tenderness, or signs of infection (pain, swelling, redness, odor or green/yellow discharge around incision site)   Complete by: As directed    Call MD for:  severe uncontrolled pain   Complete by: As directed    Call MD for:  temperature >100.4   Complete by: As directed    Diet - low  sodium heart healthy   Complete by: As directed    Diet - low sodium heart healthy   Complete by: As directed    Discharge instructions   Complete by: As directed    1. Follow a low-salt diet - you are allowed no more than 2,000mg  of sodium per day. Watch your fluid intake. In general, you should not be taking in more than 2 liters of fluid per day (no more than 8 glasses per day). This includes sources of water in foods like soup, coffee, tea, milk, etc. 2. Weigh yourself on the same scale at same time of day and keep a log. 3. Call your doctor: (Anytime you feel any of the following symptoms)  - 3lb weight gain overnight or 5lb within a few days - Shortness of breath, with or without a dry hacking cough  - Swelling in the hands, feet or stomach  - If you have to sleep on extra pillows at night in order to breathe   IT IS IMPORTANT TO LET YOUR DOCTOR KNOW EARLY ON IF YOU ARE HAVING SYMPTOMS SO WE CAN HELP YOU!  Cardiac risk counseling and prevention recommendations:  -Recommend heart healthy/Mediterranean diet including whole grains, fruits, vegetables, fish, lean meats, and nuts.  Limit salt intake  -Recommend moderate walking including 3-5 times/week for 30 to 50 minutes per session with a goal of at least 150 minutes/week  -Recommend avoidance of tobacco products as well as excessive alcohol intake  -Blood pressure: BP goal should be less than 120/80 mmHg   Increase activity slowly   Complete by: As directed    Increase activity slowly   Complete by: As directed       Discharge Medications   Allergies as of 11/14/2020   No Known Allergies     Medication List    STOP taking these medications   furosemide 40 MG tablet Commonly known as: LASIX     TAKE these medications   albuterol 108 (90 Base) MCG/ACT inhaler Commonly known as: VENTOLIN HFA Inhale 2 puffs into the lungs every 6 (six) hours as needed for wheezing or shortness of breath. Dx J44.1--- COPD   apixaban 5  MG Tabs tablet Commonly known as: ELIQUIS Take 1 tablet (5 mg total) by mouth 2 (two) times daily.   bisacodyl 10 MG suppository Commonly known as: DULCOLAX Place 1 suppository (  10 mg total) rectally as needed for moderate constipation.   bisoprolol 5 MG tablet Commonly known as: ZEBETA Take 1 tablet (5 mg total) by mouth daily.   guaiFENesin 600 MG 12 hr tablet Commonly known as: MUCINEX Take 1 tablet (600 mg total) by mouth 2 (two) times daily.   multivitamin with minerals Tabs tablet Take 1 tablet by mouth daily.   nicotine 21 mg/24hr patch Commonly known as: NICODERM CQ - dosed in mg/24 hours Place 1 patch (21 mg total) onto the skin daily.   ondansetron 4 MG disintegrating tablet Commonly known as: ZOFRAN-ODT Take 1 tablet (4 mg total) by mouth every 8 (eight) hours as needed for nausea or vomiting.   sodium phosphate 7-19 GM/118ML Enem Place 133 mLs (1 enema total) rectally daily as needed for severe constipation.   spironolactone 25 MG tablet Commonly known as: ALDACTONE Take 0.5 tablets (12.5 mg total) by mouth daily.   torsemide 20 MG tablet Commonly known as: DEMADEX Take 1 tablet (20 mg total) by mouth daily.   triamcinolone cream 0.5 % Commonly known as: KENALOG Apply topically 2 (two) times daily.            Durable Medical Equipment  (From admission, onward)         Start     Ordered   11/10/20 2205  For home use only DME Walker rolling  Once       Question Answer Comment  Walker: With 5 Inch Wheels   Patient needs a walker to treat with the following condition Weakness      11/10/20 2205            Outstanding Labs/Studies   BMET  Duration of Discharge Encounter   Greater than 30 minutes including physician time.  Signed, Kathyrn Drown, NP 11/14/2020, 9:34 AM

## 2020-11-14 NOTE — TOC Transition Note (Signed)
Transition of Care [facility]) - CM/SW Discharge Note   Patient Details  Name: Jordan Kelly MRN: [id number] Date of Birth: 1961-06-01  [facility]) CM/SW Contact:  Zenon Mayo, RN Phone Number: 11/14/2020, 9:56 AM   Clinical Narrative:    NCM spoke with patient regarding eliqus co pay of [phone] , MD states they will work with him outpatient to change his [facility] and also NCM gave him a 10.00 copay card to see if would be able to use it for his refills.  Patient states he will call his ride to transport him home today.         Patient Goals and CMS Choice        Discharge Placement                       Discharge Plan and Services                                     Social Determinants of Health (SDOH) Interventions     Readmission Risk Interventions No flowsheet data found.

## 2020-11-14 NOTE — Discharge Instructions (Signed)
Atrial Flutter  Atrial flutter is a type of abnormal heart rhythm (arrhythmia). The heart has an electrical system that tells it how to beat. In atrial flutter, the signals move rapidly in the top chambers of the heart (the atria). This makes your heart beat very fast. Atrial flutter can come and go, or it can be permanent. The goal of treatment is to prevent blood clots from forming, control your heart rate, or restore your heartbeat to a normal rhythm. If this condition is not treated, it can cause serious problems, such as a weakened heart muscle (cardiomyopathy) or a stroke. What are the causes? This condition is often caused by conditions that damage the heart's electrical system. These include:  Heart conditions and heart surgery. These include heart attacks and open-heart surgery.  Lung problems, such as COPD or a blood clot in the lung (pulmonary embolism, or PE).  Poorly controlled high blood pressure (hypertension).  Overactive thyroid (hyperthyroidism).  Diabetes. In some cases, the cause of this condition is not known. What increases the risk? You are more likely to develop this condition if:  You are an elderly adult.  You are a man.  You are overweight (obese).  You have obstructive sleep apnea.  You have a family history of atrial flutter.  You have diabetes.  You drink a lot of alcohol, especially binge drinking.  You use drugs, including cannabis.  You smoke. What are the signs or symptoms? Symptoms of this condition include:  A feeling that your heart is pounding or racing (palpitations).  Shortness of breath.  Chest pain.  Feeling dizzy or light-headed.  Fainting.  Low blood pressure (hypotension).  Fatigue.  Tiring easily during exercise or activity. In some cases, there are no symptoms. How is this diagnosed? This condition may be diagnosed with:  An electrocardiogram (ECG) to check electrical signals of the heart.  An ambulatory  cardiac monitor to record your heart's activity for a few days.  An echocardiogram to create pictures of your heart.  A transesophageal echocardiogram (TEE) to create even better pictures of your heart.  A stress test to check your blood supply while you exercise.  Imaging tests, such as a CT scan or chest X-ray.  Blood tests. How is this treated? Treatment depends on underlying conditions and how you feel when you experience atrial flutter. This condition may be treated with:  Medicines to prevent blood clots or to treat heart rate or heart rhythm problems.  Electrical cardioversion to reset the heart's rhythm.  Ablation to remove the heart tissue that sends abnormal signals.  Left atrial appendage closure to seal the area where blood clots can form. In some cases, underlying conditions will be treated. Follow these instructions at home: Medicines  Take over-the-counter and prescription medicines only as told by your health care provider.  Do not take any new medicines without talking to your health care provider.  If you are taking blood thinners: ? Talk with your health care provider before you take any medicines that contain aspirin or NSAIDs, such as ibuprofen. These medicines increase your risk for dangerous bleeding. ? Take your medicine exactly as told, at the same time every day. ? Avoid activities that could cause injury or bruising, and follow instructions about how to prevent falls. ? Wear a medical alert bracelet or carry a card that lists what medicines you take. Lifestyle  Eat heart-healthy foods. Talk with a dietitian to make an eating plan that is right for you.  Do not use any products that contain nicotine or tobacco, such as cigarettes, e-cigarettes, and chewing tobacco. If you need help quitting, ask your health care provider.  Do not drink alcohol.  Do not use drugs, including cannabis.  Lose weight if you are overweight or obese.  Exercise  regularly as instructed by your health care provider. General instructions  Do not use diet pills unless your health care provider approves. Diet pills may make heart problems worse.  If you have obstructive sleep apnea, manage your condition as told by your health care provider.  Keep all follow-up visits as told by your health care provider. This is important. Contact a health care provider if you:  Notice a change in the rate, rhythm, or strength of your heartbeat.  Are taking a blood thinner and you notice more bruising.  Have a sudden change in weight.  Tire more easily when you exercise or do heavy work. Get help right away if you have:  Pain or pressure in your chest.  Shortness of breath.  Fainting.  Increasing sweating with no known cause.  Side effects of blood thinners, such as blood in your vomit, stool, or urine, or bleeding that cannot stop.  Any symptoms of a stroke. "BE FAST" is an easy way to remember the main warning signs of a stroke: ? B - Balance. Signs are dizziness, sudden trouble walking, or loss of balance. ? E - Eyes. Signs are trouble seeing or a sudden change in vision. ? F - Face. Signs are sudden weakness or numbness of the face, or the face or eyelid drooping on one side. ? A - Arms. Signs are weakness or numbness in an arm. This happens suddenly and usually on one side of the body. ? S - Speech. Signs are sudden trouble speaking, slurred speech, or trouble understanding what people say. ? T - Time. Time to call emergency services. Write down what time symptoms started.  Other signs of a stroke, such as: ? A sudden, severe headache with no known cause. ? Nausea or vomiting. ? Seizure.  These symptoms may represent a serious problem that is an emergency. Do not wait to see if the symptoms will go away. Get medical help right away. Call your local emergency services (911 in the [place] Do not drive yourself to the hospital. Summary  Atrial  flutter is an abnormal heart rhythm that can give you symptoms of palpitations, shortness of breath, or fatigue.  Atrial flutter is often treated with medicines to keep your heart in a normal rhythm and to prevent a stroke.  Get help right away if you cannot catch your breath, or have chest pain or pressure.  Get help right away if you have signs or symptoms of a stroke. This information is not intended to replace advice given to you by your health care provider. Make sure you discuss any questions you have with your health care provider. Document Revised: 01/20/2019 Document Reviewed: 01/20/2019 Elsevier Patient Education  [place] on my medicine - ELIQUIS (apixaban)  This medication education was reviewed with me or my healthcare representative as part of my discharge preparation.  Why was Eliquis prescribed for you? Eliquis was prescribed for you to reduce the risk of a blood clot forming that can cause a stroke if you have a medical condition called atrial fibrillation (a type of irregular heartbeat).  What do You need to know about Eliquis ? Take your Eliquis TWICE DAILY - one tablet  in the morning and one tablet in the evening with or without food. If you have difficulty swallowing the tablet whole please discuss with your pharmacist how to take the medication safely.  Take Eliquis exactly as prescribed by your doctor and DO NOT stop taking Eliquis without talking to the doctor who prescribed the medication.  Stopping may increase your risk of developing a stroke.  Refill your prescription before you run out.  After discharge, you should have regular check-up appointments with your healthcare provider that is prescribing your Eliquis.  In the future your dose may need to be changed if your kidney function or weight changes by a significant amount or as you get older.  What do you do if you miss a dose? If you miss a dose, take it as soon as you remember  on the same day and resume taking twice daily.  Do not take more than one dose of ELIQUIS at the same time to make up a missed dose.  Important Safety Information A possible side effect of Eliquis is bleeding. You should call your healthcare provider right away if you experience any of the following: ? Bleeding from an injury or your nose that does not stop. ? Unusual colored urine (red or dark brown) or unusual colored stools (red or black). ? Unusual bruising for unknown reasons. ? A serious fall or if you hit your head (even if there is no bleeding).  Some medicines may interact with Eliquis and might increase your risk of bleeding or clotting while on Eliquis. To help avoid this, consult your healthcare provider or pharmacist prior to using any new prescription or non-prescription medications, including herbals, vitamins, non-steroidal anti-inflammatory drugs (NSAIDs) and supplements.  This website has more information on Eliquis (apixaban): http://www.eliquis.com/eliquis/home

## 2020-11-14 NOTE — Progress Notes (Signed)
D/C instructions given and reviewed. Tele removed. Waiting on [facility] pharmacy meds.

## 2020-11-14 NOTE — Plan of Care (Signed)

## 2020-11-15 ENCOUNTER — Telehealth: Payer: Self-pay | Admitting: *Deleted

## 2020-11-15 NOTE — Chronic Care Management (AMB) (Signed)
  Care Management   Note  11/15/2020 Name: Jordan Kelly MRN: [id number] DOB: May 12, 1961  Jordan Kelly is a 60 y.o. year old male who is a primary care patient of Noreene Larsson, NP. I reached out to Galen Daft by phone today in response to a referral sent by Mr. Pascual Mantel Burks's PCP, Noreene Larsson, NP. .    Mr. Loberg was given information about care management services today including:  1. Care management services include personalized support from designated clinical staff supervised by his physician, including individualized plan of care and coordination with other care providers 2. 24/7 contact phone numbers for assistance for urgent and routine care needs. 3. The patient may stop care management services at any time by phone call to the office staff.  Patient agreed to services and verbal consent obtained.   Follow up plan: Telephone appointment with care management team member scheduled for:11/30/2020  [facility]

## 2020-11-28 ENCOUNTER — Other Ambulatory Visit: Payer: Self-pay

## 2020-11-28 ENCOUNTER — Ambulatory Visit (INDEPENDENT_AMBULATORY_CARE_PROVIDER_SITE_OTHER): Payer: 59 | Admitting: Nurse Practitioner

## 2020-11-28 ENCOUNTER — Encounter: Payer: Self-pay | Admitting: Nurse Practitioner

## 2020-11-28 VITALS — BP 117/78 | HR 79 | Ht 71.0 in | Wt 158.0 lb

## 2020-11-28 DIAGNOSIS — I50813 Acute on chronic right heart failure: Secondary | ICD-10-CM | POA: Diagnosis not present

## 2020-11-28 DIAGNOSIS — I4892 Unspecified atrial flutter: Secondary | ICD-10-CM | POA: Diagnosis not present

## 2020-11-28 DIAGNOSIS — M7989 Other specified soft tissue disorders: Secondary | ICD-10-CM

## 2020-11-28 DIAGNOSIS — F1010 Alcohol abuse, uncomplicated: Secondary | ICD-10-CM

## 2020-11-28 DIAGNOSIS — L409 Psoriasis, unspecified: Secondary | ICD-10-CM | POA: Diagnosis not present

## 2020-11-28 DIAGNOSIS — I270 Primary pulmonary hypertension: Secondary | ICD-10-CM | POA: Diagnosis not present

## 2020-11-28 NOTE — Progress Notes (Signed)
Acute Office Visit  Subjective:    Patient ID: Jordan Kelly, male    DOB: Feb 19, 1961, 60 y.o.   MRN: [id number]  Chief Complaint  Patient presents with  . Transitions Of Care    HPI Patient is in today for transition of care visit following hospitalization. He was admitted from 3/26-11/14/20  1. Typical Atrial Flutter  -Typical atrial flutter.  Secondary to RV enlargement.  Underwent TEE/cardioversion this admission.  He will continue Eliquis for least 1 month.  The decision regarding long-term anticoagulation will be made with his primary cardiologist.    2.  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  Was diuresed nearly 5 L this admission.  Weights decreased from 80.6 kg to 74.4 kg.  Course complicated by atrial flutter.  Found to have a PFO with right to left shunting.  After reviewing his images this did not appear to be an ASD.  Furthermore with his right to left shunting this did not explain his RV failure.  -He underwent TEE/cardioversion and will need to undergo uninterrupted anticoagulation.  I think he will merit an extensive work-up for pulmonary hypertension which will include a right and left heart cath, high-resolution chest CT, PFTs and VQ scan.  A CT PE study was obtained this admission that was negative for PE.  He does have an extensive smoking history and COPD.  This could also explain his RV failure.  I think the PFO is just a bystander.  -Due to cirrhosis he was discharged on Aldactone 12.5 mg daily and torsemide 20 mg daily.  -A low-sodium diet was recommended.  He was weaned from off supplemental oxygen on the day of discharge.   Echocardiogram  11/05/2020: 1. Left ventricular ejection fraction, by estimation, is 45 to 50%. The  left ventricle has mildly decreased function. The left ventricle  demonstrates global hypokinesis. Left ventricular diastolic function could  not be evaluated. There is the   interventricular septum is flattened in diastole ('D' shaped left  ventricle), consistent with right ventricular volume overload. The average  left ventricular global longitudinal strain is -12.0 %. The global  longitudinal strain is abnormal.  2. Right ventricular systolic function is moderately reduced. The right  ventricular size is moderately enlarged. There is mildly elevated  pulmonary artery systolic pressure.  3. Right atrial size was severely dilated.  4. The mitral valve is normal in structure. No evidence of mitral valve  regurgitation.  5. The aortic valve is tricuspid. Aortic valve regurgitation is not  visualized. No aortic stenosis is present.  6. The inferior vena cava is dilated in size with <50% respiratory  variability, suggesting right atrial pressure of 15 mmHg.  7. Evidence of atrial level shunting detected by color flow Doppler.  There is a small secundum atrial septal defect with bidirectional shunting  across the atrial septum.    3.  Acute hypoxic respiratory failure/COPD  -Symptoms improved with diuresis.  He did develop left heart failure and right heart failure.  I suspect his left heart failure may have been arrhythmia related.  Right heart failure could have simply been related to COPD or other causes of pulmonary pretension that will need work-up in the outpatient setting.  -He was weaned from all supplemental oxygen on the day of discharge.    4.  Alcohol abuse/cirrhosis/hyperammonemia  -Admitted with elevated ammonia level.  LFTs were elevated as well.  This was secondary to alcohol abuse as well as RV failure.  Liver enzymes did improve on the day of discharge.  Plan will be to continue Aldactone and torsemide in the outpatient setting.  He will need GI follow-up.    5.  Contact dermatitis/psoriasis  -Hospital medicine was consulted for rash.  They recommended triamcinolone cream.  Rash did improve.  He will need to follow-up with the PCP.    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 Barron Schmid felt to have a secundum ASD>>rather a PFO with right to left shunting due to elevated right heart pressures along with cor pulmonale. Unfortunately right left heart cath felt to be difficult as he underwent DCCV and will need to wait 1 month and have to do this as an outpatient. -CT PE study was negative this admission. -Transitioned from IV Lasix to torsemide 20 mg daily with continuation of . Aldactone 12.5 mg daily.  -Plan for pulmonary hypertension workup in the OP setting should include VQ scan, high-resolution chest CT, PFTs, right and left heart catheterization>>no urgency in the inpatient setting given his prolonged hospitalization.   Typical atrial flutter -S/p TEE/DCCV on 11/08/2020 to NSR  -Continue bisoprolol and Eliquis. -Uninterrupted DOAC therapy for at least 1 month   Acute hypoxic respiratory failure/COPD -Likely component of underlying volume overload/CHF as well as atelectasis from being in bed for prolonged amount of time -He will need PFTs in the outpatient setting.  Alcohol abuse/cirrhosis/hyperammonemia  -Liver enzymes normalized  -Admitted with acute liver failure which was likely worsened by hepatic congestion in the setting of RV failure. -Symptoms are improving by day of discharge  -Continue Aldactone as well as torsemide -Advised to refrain from alcohol -Needs GI follow-up upon discharge  Tobacco abuse -Cessation strongly encouraged   Contact dermatitis/cutaneous candidiasis/psoriasis -d/c nystatin; rash healed/improved greatly -Discontinue hydrocortisone and start triamcinolone cream>>continue at discharge to treat further psoriasis rashes still in place -Instructed him to follow-up outpatient with a PCP for further follow-up and management. He does not have a PCP he says, and does ask for assistance. TOC consult placed  Acute  kidney injury, resolved -Baseline creatinine ~ 0.8 -Probable CRS vs arrhythmia mediated -Renal function responded to diuresis  Past Medical History:  Diagnosis Date  . COPD (chronic obstructive pulmonary disease) ([place])   . Depression     Past Surgical History:  Procedure Laterality Date  . BUBBLE STUDY  11/08/2020   Procedure: BUBBLE STUDY;  Surgeon: Jerline Pain, MD;  Location: [facility] ENDOSCOPY;  Service: Cardiovascular;;  . CARDIOVERSION N/A 11/08/2020   Procedure: CARDIOVERSION;  Surgeon: Jerline Pain, MD;  Location: [facility] ENDOSCOPY;  Service: Cardiovascular;  Laterality: N/A;  . HERNIA REPAIR     right inguinal hernia  . TEE WITHOUT CARDIOVERSION N/A 11/08/2020   Procedure: TRANSESOPHAGEAL ECHOCARDIOGRAM (TEE);  Surgeon: Jerline Pain, MD;  Location: [facility] ENDOSCOPY;  Service: Cardiovascular;  Laterality: N/A;    History reviewed. No pertinent family history.  Social History   Socioeconomic History  . Marital status: Divorced    Spouse name: Not on file  . Number of children: Not on file  . Years of education: Not on file  . Highest education level: Not on file  Occupational History  . 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 Smoking status: Current Every Day Smoker    Packs/day: 1.00    Types: Cigarettes  . Smokeless tobacco: Never Used  Vaping Use  . Vaping Use: Never used  Substance and  Sexual Activity  . Alcohol use: Yes    Alcohol/week: 42.0 standard drinks    Types: 42 Cans of beer per week    Comment: 6 beers per day  . Drug use: Yes    Frequency: 5.0 times per week    Types: Marijuana, Benzodiazepines    Comment: last smoked 11/03/20  . Sexual activity: Not Currently  Other Topics Concern  . Not on file  Social History Narrative  . Not on file   Social Determinants of Health   Financial Resource Strain: Not on file  Food Insecurity: Not on file  Transportation Needs: Not on file  Physical Activity: Not on file  Stress: Not  on file  Social Connections: Not on file  Intimate Partner Violence: Not on file    Outpatient Medications Prior to Visit  Medication Sig Dispense Refill  . albuterol (VENTOLIN HFA) 108 (90 Base) MCG/ACT inhaler Inhale 2 puffs into the lungs every 6 (six) hours as needed for wheezing or shortness of breath. Dx J44.1--- COPD 1 Inhaler 2  . apixaban (ELIQUIS) 5 MG TABS tablet Take 1 tablet (5 mg total) by mouth 2 (two) times daily. 120 tablet 2  . bisacodyl (DULCOLAX) 10 MG suppository Place 1 suppository (10 mg total) rectally as needed for moderate constipation. 12 suppository 0  . bisoprolol (ZEBETA) 5 MG tablet Take 1 tablet (5 mg total) by mouth daily. 90 tablet 2  . guaiFENesin (MUCINEX) 600 MG 12 hr tablet Take 1 tablet (600 mg total) by mouth 2 (two) times daily. 20 tablet 0  . Multiple Vitamin (MULTIVITAMIN WITH MINERALS) TABS tablet Take 1 tablet by mouth daily. 30 tablet 3  . nicotine (NICODERM CQ - DOSED IN MG/24 HOURS) 21 mg/24hr patch Place 1 patch (21 mg total) onto the skin daily. 28 patch 0  . ondansetron (ZOFRAN-ODT) 4 MG disintegrating tablet Take 1 tablet (4 mg total) by mouth every 8 (eight) hours as needed for nausea or vomiting. 20 tablet 0  . sodium phosphate (FLEET) 7-19 GM/118ML ENEM Place 133 mLs (1 enema total) rectally daily as needed for severe constipation. 133 mL 0  . spironolactone (ALDACTONE) 25 MG tablet Take 1/2 tablet (12.5 mg total) by mouth daily. 90 tablet 2  . torsemide (DEMADEX) 20 MG tablet Take 1 tablet (20 mg total) by mouth daily. 90 tablet 2  . triamcinolone cream (KENALOG) 0.5 % Apply topically 2 (two) times daily. 30 g 0   No facility-administered medications prior to visit.    No Known Allergies  Review of Systems  Constitutional: Positive for fatigue. Negative for chills and fever.  Respiratory: Negative.   Cardiovascular: Negative.   Psychiatric/Behavioral: Positive for dysphoric mood. Negative for self-injury and suicidal ideas.        Concerned about the bills he has coming in and not being able to make money while he was in the hospital       Objective:    Physical Exam Constitutional:      Appearance: Normal appearance.  Cardiovascular:     Rate and Rhythm: Normal rate and regular rhythm.     Pulses: Normal pulses.     Heart sounds: Normal heart sounds.  Pulmonary:     Effort: Pulmonary effort is normal.     Breath sounds: Normal breath sounds.  Neurological:     Mental Status: He is alert.  Psychiatric:        Mood and Affect: Mood normal.        Behavior: Behavior  normal.        Thought Content: Thought content normal.        Judgment: Judgment normal.     BP 117/78 (BP Location: Right Arm, Patient Position: Sitting, Cuff Size: Normal)   Pulse 79   Ht _0  (1.803 m)   Wt 158 lb (71.7 kg)   SpO2 95%   BMI 22.04 kg/m  Wt Readings from Last 3 Encounters:  11/28/20 158 lb (71.7 kg)  11/14/20 164 lb 0.4 oz (74.4 kg)  10/30/20 192 lb (87.1 kg)    There are no preventive care reminders to display for this patient.  There are no preventive care reminders to display for this patient.   Lab Results  Component Value Date   TSH 1.843 11/05/2020   Lab Results  Component Value Date   WBC 9.7 11/14/2020   HGB 16.2 11/14/2020   HCT 49.6 11/14/2020   MCV 97.4 11/14/2020   PLT 273 11/14/2020   Lab Results  Component Value Date   NA 139 11/14/2020   K 3.6 11/14/2020   CO2 39 (H) 11/14/2020   GLUCOSE 100 (H) 11/14/2020   BUN 27 (H) 11/14/2020   CREATININE 0.93 11/14/2020   BILITOT 1.8 (H) 11/13/2020   ALKPHOS 82 11/13/2020   AST 31 11/13/2020   ALT 35 11/13/2020   PROT 6.1 (L) 11/13/2020   ALBUMIN 2.6 (L) 11/13/2020   CALCIUM 9.1 11/14/2020   ANIONGAP 10 11/14/2020   No results found for: CHOL No results found for: HDL No results found for: LDLCALC No results found for: TRIG No results found for: CHOLHDL No results found for: HGBA1C     Assessment & Plan:   Problem List Items  Addressed This Visit      Cardiovascular and Mediastinum   Atrial flutter with rapid ventricular response ([place])    -he had a-flutter and was started on eliquis -he states he has enough eliquis to last until 5/4 or 5/5, but can't afford the $1000/month -he will see if cardiology has samples tomorrow, if no he can call and see if he qualifies for patient assistance, or if his insurance has a different preferred DOAC      CHF (congestive heart failure) ([place]) - Primary    Wt Readings from Last 3 Encounters:  11/28/20 158 lb (71.7 kg)  11/14/20 164 lb 0.4 oz (74.4 kg)  10/30/20 192 lb (87.1 kg)   -he is still losing weight since his discharge -hasn't been drinking -cut way back on his smoking; we discussed using nicotine gum instead of cigarettes, but he states he can't afford gum right now      Relevant Orders   CMP14+EGFR   Pulmonary hypertension, primary [facility])    -Inpatient cardiology recommended work-up for pulmonary hypertension which will include a right and left heart cath, high-resolution chest CT, PFTs and VQ scan; heart cath was deferred d/t cardioversion -will defer to cardiology for workup of pulm htn; he has appt tomorrow        Musculoskeletal and Integument   Psoriasis    -has triamcinolone cream and is using it        Other   Alcohol abuse    -he states he has not been drinking since his discharge -will check CMP today      Leg swelling    -resolved with torsemide and spironolactone          No orders of the defined types were placed in this encounter.  Noreene Larsson, NP

## 2020-11-28 NOTE — Patient Instructions (Addendum)
We will get a metabolic panel today.  I will see you back in 2 weeks for a physical. We will determine what to do about eliquis at that time. We will also check cholesterol and some other routine labs prior to that appointment.  Please have fasting labs drawn 2-3 days prior to your appointment so we can discuss the results during your office visit.

## 2020-11-28 NOTE — Assessment & Plan Note (Signed)
-  Inpatient cardiology recommended work-up for pulmonary hypertension which will include a right and left heart cath, high-resolution chest CT, PFTs and VQ scan; heart cath was deferred d/t cardioversion -will defer to cardiology for workup of pulm htn; he has appt tomorrow

## 2020-11-28 NOTE — Assessment & Plan Note (Signed)
-  resolved with torsemide and spironolactone

## 2020-11-28 NOTE — Assessment & Plan Note (Signed)
-  he had a-flutter and was started on eliquis -he states he has enough eliquis to last until 5/4 or 5/5, but can't afford the $1000/month -he will see if cardiology has samples tomorrow, if no he can call and see if he qualifies for patient assistance, or if his insurance has a different preferred DOAC

## 2020-11-28 NOTE — Assessment & Plan Note (Signed)
Wt Readings from Last 3 Encounters:  11/28/20 158 lb (71.7 kg)  11/14/20 164 lb 0.4 oz (74.4 kg)  10/30/20 192 lb (87.1 kg)   -he is still losing weight since his discharge -hasn't been drinking -cut way back on his smoking; we discussed using nicotine gum instead of cigarettes, but he states he can't afford gum right now

## 2020-11-28 NOTE — Assessment & Plan Note (Signed)
-  he states he has not been drinking since his discharge -will check CMP today

## 2020-11-28 NOTE — Assessment & Plan Note (Signed)
-  has triamcinolone cream and is using it

## 2020-11-29 ENCOUNTER — Encounter: Payer: Self-pay | Admitting: Student

## 2020-11-29 ENCOUNTER — Ambulatory Visit (INDEPENDENT_AMBULATORY_CARE_PROVIDER_SITE_OTHER): Payer: 59 | Admitting: Student

## 2020-11-29 VITALS — BP 110/60 | HR 74 | Ht 71.0 in | Wt 160.0 lb

## 2020-11-29 DIAGNOSIS — Z79899 Other long term (current) drug therapy: Secondary | ICD-10-CM

## 2020-11-29 DIAGNOSIS — Z720 Tobacco use: Secondary | ICD-10-CM

## 2020-11-29 DIAGNOSIS — F1010 Alcohol abuse, uncomplicated: Secondary | ICD-10-CM

## 2020-11-29 DIAGNOSIS — Q211 Atrial septal defect: Secondary | ICD-10-CM | POA: Diagnosis not present

## 2020-11-29 DIAGNOSIS — I272 Pulmonary hypertension, unspecified: Secondary | ICD-10-CM

## 2020-11-29 DIAGNOSIS — I483 Typical atrial flutter: Secondary | ICD-10-CM | POA: Diagnosis not present

## 2020-11-29 DIAGNOSIS — Q2112 Patent foramen ovale: Secondary | ICD-10-CM

## 2020-11-29 LAB — CMP14+EGFR
ALT: 29 IU/L (ref 0–44)
AST: 25 IU/L (ref 0–40)
Albumin/Globulin Ratio: 1.2 (ref 1.2–2.2)
Albumin: 4.8 g/dL (ref 3.8–4.9)
Alkaline Phosphatase: 165 IU/L — ABNORMAL HIGH (ref 44–121)
BUN/Creatinine Ratio: 28 — ABNORMAL HIGH (ref 9–20)
BUN: 23 mg/dL (ref 6–24)
Bilirubin Total: 0.8 mg/dL (ref 0.0–1.2)
CO2: 25 mmol/L (ref 20–29)
Calcium: 10.7 mg/dL — ABNORMAL HIGH (ref 8.7–10.2)
Chloride: 90 mmol/L — ABNORMAL LOW (ref 96–106)
Creatinine, Ser: 0.81 mg/dL (ref 0.76–1.27)
Globulin, Total: 3.9 g/dL (ref 1.5–4.5)
Glucose: 94 mg/dL (ref 65–99)
Potassium: 5.2 mmol/L (ref 3.5–5.2)
Sodium: 136 mmol/L (ref 134–144)
Total Protein: 8.7 g/dL — ABNORMAL HIGH (ref 6.0–8.5)
eGFR: 103 mL/min/{1.73_m2} (ref >59)

## 2020-11-29 NOTE — Progress Notes (Signed)
Potassium and kidney function look good. His calcium and alkaline phosphatase are a little elevated, but we will monitor those with routine labs.

## 2020-11-29 NOTE — Progress Notes (Signed)
Cardiology Office Note    Date:  11/29/2020   ID:  Jordan Kelly, Jordan Kelly 1961-07-04, MRN [id number]  PCP:  Noreene Larsson, NP  Cardiologist: Evaluated by Dr. Gasper Sells and Dr. Audie Box during admission but requests to follow-up in [facility] Complaint  Patient presents with  . Hospitalization Follow-up    History of Present Illness:    Jordan Kelly is a 60 y.o. male with past medical history of COPD, tobacco use and alcohol use who presents to the office today for hospital follow-up.   He was admitted to [facility] on 11/04/2020 for evaluation of worsening fatigue and decreased urination and was found to be in new-onset atrial flutter with RVR. Was initially started on IV Cardizem as it was recommended to avoid Amiodarone given his elevated LFT's (AST 70 and ALT 99 on admission). His echocardiogram showed a mildly reduced EF of 45-50% with global HK but was noted to have right ventricular volume overload with left ventricular global longitudinal strain at -12.0 % and evidence of atrial level shunting detected by color flow with a small secundum atrial septal defect with bidirectional shunting across the atrial septum. CTA showed no evidence of a PE but was noted to have coronary artery calcifications and a moderate right pleural effusion. Was closely monitored in the ICU given concerns for alcohol withdrawal and was on CIWA protocol. Once his respiratory status improved, he underwent TEE-guided DCCV on 11/08/2020 which showed EF at 45-50% and atrial level shunting and contrast bubble study was positive with shunting observed within 3-6 cardiac cycles suggestive of interatrial shunt. Also noted to have a secundum atrial septal defect with predominantly right to left shunting across the atrial septum and diameter of his ASD was approximately 0.5cm. RV function was moderately reduced. He did undergo successful DCCV afterwards with return to NSR with 120 J. Images were further reviewed by  Dr. Audie Box and felt most consistent with a PFO with right to left shunting and did not explain his RV failure. He continued to receive IV Lasix for several days following the procedure and was eventually transitioned to Torsemide 20 mg daily along with Spironolactone 12.5 mg daily at discharge (weight at 74.4 kg). Was continued on Bisoprolol for rate control along with Eliquis for anticoagulation. It was recommended to have a thorough work-up for pulmonary hypertension in the outpatient setting to include V/Q scan, PFT's and R/LHC (once more than 1 month out from DCCV). Was recommended that he follow-up with his PCP as an outpatient with likely GI referral given his alcohol abuse and cirrhosis.  In talking with the patient today, he reports his dyspnea has significantly improved since his hospitalization. He was unaware of his atrial fibrillation during admission but denies any recent palpitations or chest pain. No reported orthopnea or PND. His weight has been stable on his home scales and he denies any recurrent lower extremity edema. He has reduced his tobacco use from 2 ppd to less than 5 cigarettes per day and has reduced his beer consumption from a 6-pack daily to less than 3 beers over the past few weeks.    Past Medical History:  Diagnosis Date  . Atrial flutter ([place])   . COPD (chronic obstructive pulmonary disease) ([place])   . Depression   . PFO (patent foramen ovale)     Past Surgical History:  Procedure Laterality Date  . BUBBLE STUDY  11/08/2020   Procedure: BUBBLE STUDY;  Surgeon: Jerline Pain, MD;  Location:  [facility];  Service: Cardiovascular;;  . CARDIOVERSION N/A 11/08/2020   Procedure: CARDIOVERSION;  Surgeon: Jerline Pain, MD;  Location: [facility];  Service: Cardiovascular;  Laterality: N/A;  . HERNIA REPAIR     right inguinal hernia  . TEE WITHOUT CARDIOVERSION N/A 11/08/2020   Procedure: TRANSESOPHAGEAL ECHOCARDIOGRAM (TEE);  Surgeon: Jerline Pain, MD;  Location: [facility]  ENDOSCOPY;  Service: Cardiovascular;  Laterality: N/A;    Current Medications: Outpatient Medications Prior to Visit  Medication Sig Dispense Refill  . albuterol (VENTOLIN HFA) 108 (90 Base) MCG/ACT inhaler Inhale 2 puffs into the lungs every 6 (six) hours as needed for wheezing or shortness of breath. Dx J44.1--- COPD 1 Inhaler 2  . apixaban (ELIQUIS) 5 MG TABS tablet Take 1 tablet (5 mg total) by mouth 2 (two) times daily. 120 tablet 2  . bisacodyl (DULCOLAX) 10 MG suppository Place 1 suppository (10 mg total) rectally as needed for moderate constipation. 12 suppository 0  . bisoprolol (ZEBETA) 5 MG tablet Take 1 tablet (5 mg total) by mouth daily. 90 tablet 2  . Multiple Vitamin (MULTIVITAMIN WITH MINERALS) TABS tablet Take 1 tablet by mouth daily. 30 tablet 3  . nicotine (NICODERM CQ - DOSED IN MG/24 HOURS) 21 mg/24hr patch Place 1 patch (21 mg total) onto the skin daily. 28 patch 0  . ondansetron (ZOFRAN-ODT) 4 MG disintegrating tablet Take 1 tablet (4 mg total) by mouth every 8 (eight) hours as needed for nausea or vomiting. 20 tablet 0  . sodium phosphate (FLEET) 7-19 GM/118ML ENEM Place 133 mLs (1 enema total) rectally daily as needed for severe constipation. 133 mL 0  . spironolactone (ALDACTONE) 25 MG tablet Take 1/2 tablet (12.5 mg total) by mouth daily. 90 tablet 2  . torsemide (DEMADEX) 20 MG tablet Take 1 tablet (20 mg total) by mouth daily. 90 tablet 2  . triamcinolone cream (KENALOG) 0.5 % Apply topically 2 (two) times daily. 30 g 0  . guaiFENesin (MUCINEX) 600 MG 12 hr tablet Take 1 tablet (600 mg total) by mouth 2 (two) times daily. 20 tablet 0   No facility-administered medications prior to visit.     Allergies:   Patient has no known allergies.   Social History   Socioeconomic History  . Marital status: Divorced    Spouse name: Not on file  . Number of children: Not on file  . Years of education: Not on file  . Highest education level: Not on file  Occupational  History  . 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 Smoking status: Current Every Day Smoker    Packs/day: 0.25    Types: Cigarettes  . Smokeless tobacco: Never Used  Vaping Use  . Vaping Use: Never used  Substance and Sexual Activity  . Alcohol use: Yes    Alcohol/week: 14.0 standard drinks    Types: 14 Cans of beer per week    Comment: 2  . Drug use: Yes    Frequency: 5.0 times per week    Types: Marijuana    Comment: last smoked 11/03/20  . Sexual activity: Not Currently  Other Topics Concern  . Not on file  Social History Narrative  . Not on file   Social Determinants of Health   Financial Resource Strain: Not on file  Food Insecurity: Not on file  Transportation Needs: Not on file  Physical Activity: Not on file  Stress: Not on file  Social Connections: Not on  file     Family History:  The patient's family history is not on file.   Review of Systems:   Please see the history of present illness.     General:  No chills, fever, night sweats or weight changes.  Cardiovascular:  No chest pain, orthopnea, palpitations, paroxysmal nocturnal dyspnea. Positive for dyspnea on exertion and edema (improved).  Dermatological: No rash, lesions/masses Respiratory: No cough.  Urologic: No hematuria, dysuria Abdominal:   No nausea, vomiting, diarrhea, bright red blood per rectum, melena, or hematemesis Neurologic:  No visual changes, wkns, changes in mental status. All other systems reviewed and are otherwise negative except as noted above.   Physical Exam:    VS:  BP 110/60   Pulse 74   Ht 5\' 11"  (1.803 m)   Wt 160 lb (72.6 kg)   SpO2 91%   BMI 22.32 kg/m    General: Well developed, well nourished,male appearing in no acute distress. Head: Normocephalic, atraumatic. Neck: No carotid bruits. JVD not elevated.  Lungs: Respirations regular and unlabored, without wheezes or rales.  Heart: Regular rate and rhythm. No S3 or S4.  No murmur, no  rubs, or gallops appreciated. Abdomen: Appears non-distended. No obvious abdominal masses. Msk:  Strength and tone appear normal for age. No obvious joint deformities or effusions. Extremities: No clubbing or cyanosis. No lower extremity edema.  Distal pedal pulses are 2+ bilaterally. Neuro: Alert and oriented X 3. Moves all extremities spontaneously. No focal deficits noted. Psych:  Responds to questions appropriately with a normal affect. Skin: No rashes. Psoriatic plaques noted.   Wt Readings from Last 3 Encounters:  11/29/20 160 lb (72.6 kg)  11/28/20 158 lb (71.7 kg)  11/14/20 164 lb 0.4 oz (74.4 kg)     Studies/Labs Reviewed:   EKG:  EKG is ordered today.  The ekg ordered today demonstrates NSR, HR 76 with no acute ST changes when compared to prior tracings.    Recent Labs: 11/05/2020: TSH 1.843 11/08/2020: B Natriuretic Peptide 1,061.0 11/14/2020: Hemoglobin 16.2; Magnesium 1.8; Platelets 273 11/28/2020: ALT 29; BUN 23; Creatinine, Ser 0.81; Potassium 5.2; Sodium 136   Lipid Panel No results found for: CHOL, TRIG, HDL, CHOLHDL, VLDL, LDLCALC, LDLDIRECT  Additional studies/ records that were reviewed today include:   Echocardiogram: 11/05/2020 IMPRESSIONS    1. Left ventricular ejection fraction, by estimation, is 45 to 50%. The  left ventricle has mildly decreased function. The left ventricle  demonstrates global hypokinesis. Left ventricular diastolic function could  not be evaluated. There is the  interventricular septum is flattened in diastole ('D' shaped left  ventricle), consistent with right ventricular volume overload. The average  left ventricular global longitudinal strain is -12.0 %. The global  longitudinal strain is abnormal.  2. Right ventricular systolic function is moderately reduced. The right  ventricular size is moderately enlarged. There is mildly elevated  pulmonary artery systolic pressure.  3. Right atrial size was severely dilated.  4. The  mitral valve is normal in structure. No evidence of mitral valve  regurgitation.  5. The aortic valve is tricuspid. Aortic valve regurgitation is not  visualized. No aortic stenosis is present.  6. The inferior vena cava is dilated in size with <50% respiratory  variability, suggesting right atrial pressure of 15 mmHg.  7. Evidence of atrial level shunting detected by color flow Doppler.  There is a small secundum atrial septal defect with bidirectional shunting  across the atrial septum.    TEE: 11/08/2020 IMPRESSIONS  1. Left ventricular ejection fraction, by estimation, is 45 to 50%. The  left ventricle has mildly decreased function. The left ventricle  demonstrates global hypokinesis.  2. Evidence of atrial level shunting detected by color flow Doppler.  Agitated saline contrast bubble study was positive with shunting observed  within 3-6 cardiac cycles suggestive of interatrial shunt. There is a  secundum atrial septal defect with  predominantly right to left shunting across the atrial septum. Diameter of  ASD is approximately 0.5cm. There is 1.8 cm landing zone on either side of  ASD. Normal coronary sinus/ PV drainage.  3. Right ventricular systolic function is moderately reduced. The right  ventricular size is mildly enlarged.  4. Left atrial size was moderately dilated. No left atrial/left atrial  appendage thrombus was detected.  5. Right atrial size was severely dilated.  6. The mitral valve is normal in structure. No evidence of mitral valve  regurgitation. No evidence of mitral stenosis.  7. The aortic valve is normal in structure. Aortic valve regurgitation is  not visualized. No aortic stenosis is present.  8. The inferior vena cava is normal in size with greater than 50%  respiratory variability, suggesting right atrial pressure of 3 mmHg.  9. Secundum ASD.    Assessment:    1. Pulmonary hypertension ([place])   2. PFO (patent foramen ovale)   3.  Typical atrial flutter ([place])   4. Medication management   5. Alcohol abuse   6. Tobacco abuse      Plan:   In order of problems listed above:  1. Pulmonary HTN/RV Failure/PFO - Felt to initially be an ASD during his recent admission but was further reviewed and most consistent with a PFO with right to left shunting and not felt to explain his RV failure with outpatient work-up of Pulmonary HTN recommended.  - Reviewed with the patient today and will plan for [facility] for definitive evaluation as recommended during his admission. Procedure, risks and benefits were reviewed and he is in agreement to proceed. He will call back to schedule once he has confirmed transportation (will need to be after 12/14/2020 given his recent DCCV). High-resolution Chest CT, VQ and PFT's were also recommended but will plan to obtain after his cardiac catheterization as he reports already feeling overwhelmed with his upcoming appointments. If PFT's abnormal, would refer to Pulmonology given his extensive tobacco history.  - Continue Bisoprolol 5mg  daily, Spironolactone 12.5mg  daily and Torsemide 20mg  daily. Will recheck BMET in 2 weeks as K+ was at 5.2 yesterday and he is not on K+ supplementation.   2. Typical Atrial Flutter - He did undergo successful DCCV during admission and is maintaining NSR by examination and EKG today. Continue Bisoprolol 5mg  daily for rate-control and Eliquis 5mg  BID for anticoagulation. Samples of Eliquis and a $10 co-pay card were provided today. May need to enroll in patient assistance if unable to use co-pay card with his insurnace.  3. Alcohol Abuse/Questionable Cirrhosis - He has reduced his consumption from a 6-pack per day to 3 beers since hospital discharge. LFT's have also normalized and were rechecked by his PCP yesterday with AST at 25 and ALT 29. Abdominal US during admission did show questionable micro lobulated hepatic contours which can be seen with cirrhosis and no focal  lesions. Will defer to his PCP in regards to continued follow-up with routine labs vs. GI referral. Was started on Spironolactone during admission.   4. Tobacco use - He was previously smoking 2 ppd but is  down to less than 5 cigarettes per day while using Nicotine patches. Was congratulated on this!   Shared Decision Making/Informed Consent:   Shared Decision Making/Informed Consent The risks [stroke (1 in 1000), death (1 in 1000), kidney failure [usually temporary] (1 in 500), bleeding (1 in 200), allergic reaction [possibly serious] (1 in 200)], benefits (diagnostic support and management of coronary artery disease) and alternatives of a cardiac catheterization were discussed in detail with Mr. Scogin and he is willing to proceed.        Medication Adjustments/Labs and Tests Ordered: Current medicines are reviewed at length with the patient today.  Concerns regarding medicines are outlined above.  Medication changes, Labs and Tests ordered today are listed in the Patient Instructions below. Patient Instructions   Medication Instructions:  Your physician recommends that you continue on your current medications as directed. Please refer to the Current Medication list given to you today.  Call office to schedule heart cath. To be scheduled after May 5  *If you need a refill on your cardiac medications before your next appointment, please call your pharmacy*   Lab Work: Your physician recommends that you return for lab work in: 2 Weeks   If you have labs (blood work) drawn today and your tests are completely normal, you will receive your results only by: Marland Kitchen MyChart Message (if you have MyChart) OR . A paper copy in the mail If you have any lab test that is abnormal or we need to change your treatment, we will call you to review the results.   Testing/Procedures: Your physician has requested that you have a cardiac catheterization. Cardiac catheterization is used to diagnose and/or  treat various heart conditions. Doctors may recommend this procedure for a number of different reasons. The most common reason is to evaluate chest pain. Chest pain can be a symptom of coronary artery disease (CAD), and cardiac catheterization can show whether plaque is narrowing or blocking your heart's arteries. This procedure is also used to evaluate the valves, as well as measure the blood flow and oxygen levels in different parts of your heart. For further information please visit [email or web address]. Please follow instruction sheet, as given.  Follow-Up: At [facility], you and your health needs are our priority.  As part of our continuing mission to provide you with exceptional heart care, we have created designated Provider Care Teams.  These Care Teams include your primary Cardiologist (physician) and Advanced Practice Providers (APPs -  Physician Assistants and Nurse Practitioners) who all work together to provide you with the care you need, when you need it.  We recommend signing up for the patient portal called "MyChart".  Sign up information is provided on this After Visit Summary.  MyChart is used to connect with patients for Virtual Visits (Telemedicine).  Patients are able to view lab/test results, encounter notes, upcoming appointments, etc.  Non-urgent messages can be sent to your provider as well.   To learn more about what you can do with MyChart, go to [email or web address].    Your next appointment:   6-8  week(s)  The format for your next appointment:   In Person  Provider:   Bernerd Pho, PA-C   Other Instructions Thank you for choosing [facility]!    Potassium Content of Foods  Potassium is a mineral found in many foods and drinks. It affects how the heart works, and helps keep fluids and minerals balanced in the body. The amount of potassium you need each day  depends on your age and any medical conditions you may have. Talk to your health care  provider or dietitian about how much potassium you need. The following lists of foods provide the general serving size for foods and the approximate amount of potassium in each serving, listed in milligrams (mg). Actual values may vary depending on the product and how it is processed. High in potassium The following foods and beverages have 200 mg or more of potassium per serving:  Apricots (raw) - 2 have 200 mg of potassium.  Apricots (dry) - 5 have 200 mg of potassium.  Artichoke - 1 medium has 345 mg of potassium.  Avocado -  fruit has 245 mg of potassium.  Banana - 1 medium fruit has 425 mg of potassium.  [place] or baked beans (canned) -  cup has 280 mg of potassium.  White beans (canned) -  cup has 595 mg potassium.  Beef roast - 3 oz has 320 mg of potassium.  Ground beef - 3 oz has 270 mg of potassium.  Beets (raw or cooked) -  cup has 260 mg of potassium.  Bran muffin - 2 oz has 300 mg of potassium.  Broccoli (cooked) -  cup has 230 mg of potassium.  Brussels sprouts -  cup has 250 mg of potassium.  Cantaloupe -  cup has 215 mg of potassium.  Cereal, 100% bran -  cup has 200-400 mg of potassium.  Cheeseburger -1 single fast food burger has 225-400 mg of potassium.  Chicken - 3 oz has 220 mg of potassium.  Clams (canned) - 3 oz has 535 mg of potassium.  Crab - 3 oz has 225 mg of potassium.  Dates - 5 have 270 mg of potassium.  Dried beans and peas -  cup has 300-475 mg of potassium.  Figs (dried) - 2 have 260 mg of potassium.  Fish (halibut, tuna, cod, snapper) - 3 oz has 480 mg of potassium.  Fish (salmon, haddock, swordfish, perch) - 3 oz has 300 mg of potassium.  Fish (tuna, canned) - 3 oz has 200 mg of potassium.  [place] fries (fast food) - 3 oz has 470 mg of potassium.  Granola with fruit and nuts -  cup has 200 mg of potassium.  Grapefruit juice -  cup has 200 mg of potassium.  Honeydew melon -  cup has 200 mg of potassium.  Kale  (raw) - 1 cup has 300 mg of potassium.  Kiwi - 1 medium fruit has 240 mg of potassium.  Kohlrabi, rutabaga, parsnips -  cup has 280 mg of potassium.  Lentils -  cup has 365 mg of potassium.  Mango - 1 each has 325 mg of potassium.  Milk (nonfat, low-fat, whole, buttermilk) - 1 cup has 350-380 mg of potassium.  Milk (chocolate) - 1 cup has 420 mg of potassium  Molasses - 1 Tbsp has 295 mg of potassium.  Mushrooms -  cup has 280 mg of potassium.  Nectarine - 1 each has 275 mg of potassium.  Nuts (almonds, peanuts, hazelnuts, [place], cashew, mixed) - 1 oz has 200 mg of potassium.  Nuts (pistachios) - 1 oz has 295 mg of potassium.  Orange - 1 fruit has 240 mg of potassium.  Orange juice -  cup has 235 mg of potassium.  Papaya -  medium fruit has 390 mg of potassium.  Peanut butter (chunky) - 2 Tbsp has 240 mg of potassium.  Peanut butter (smooth) - 2 Tbsp has  210 mg of potassium.  Pear - 1 medium (200 mg) of potassium.  Pomegranate - 1 whole fruit has 400 mg of potassium.  Pomegranate juice -  cup has 215 mg of potassium.  Pork - 3 oz has 350 mg of potassium.  Potato chips (salted) - 1 oz has 465 mg of potassium.  Potato (baked with skin) - 1 medium has 925 mg of potassium.  Potato (boiled) -  cup has 255 mg of potassium.  Potato (Mashed) -  cup has 330 mg of potassium.  Prune juice -  cup has 370 mg of potassium.  Prunes - 5 have 305 mg of potassium.  Pudding (chocolate) -  cup has 230 mg of potassium.  Pumpkin (canned) -  cup has 250 mg of potassium.  Raisins (seedless) -  cup has 270 mg of potassium.  Seeds (sunflower or pumpkin) - 1 oz has 240 mg of potassium.  Soy milk - 1 cup has 300 mg of potassium.  Spinach (cooked) - 1/2 cup has 420 mg of potassium.  Spinach (canned) -  cup has 370 mg of potassium.  Sweet potato (baked with skin) - 1 medium has 450 mg of potassium.  Swiss chard -  cup has 480 mg of potassium.  Tomato or  vegetable juice -  cup has 275 mg of potassium.  Tomato (sauce or puree) -  cup has 400-550 mg of potassium.  Tomato (raw) - 1 medium has 290 mg of potassium.  Tomato (canned) -  cup has 200-300 mg of potassium.  [place] - 3 oz has 250 mg of potassium.  Wheat germ - 1 oz has 250 mg of potassium.  Winter squash -  cup has 250 mg of potassium.  Yogurt (plain or fruited) - 6 oz has 260-435 mg of potassium.  Zucchini -  cup has 220 mg of potassium. Moderate in potassium The following foods and beverages have 50-200 mg of potassium per serving:  Apple - 1 fruit has 150 mg of potassium  Apple juice -  cup has 150 mg of potassium  Applesauce -  cup has 90 mg of potassium  Apricot nectar -  cup has 140 mg of potassium  Asparagus (small spears) -  cup has 155 mg of potassium  Asparagus (large spears) - 6 have 155 mg of potassium  Bagel (cinnamon raisin) - 1 four-inch bagel has 130 mg of potassium  Bagel (egg or plain) - 1 four- inch bagel has 70 mg of potassium  Beans (green) -  cup has 90 mg of potassium  Beans (yellow) -  cup has 190 mg of potassium  Beer, regular - 12 oz has 100 mg of potassium  Beets (canned) -  cup has 125 mg of potassium  Blackberries -  cup has 115 mg of potassium  Blueberries -  cup has 60 mg of potassium  Bread (whole wheat) - 1 slice has 70 mg of potassium  Broccoli (raw) -  cup has 145 mg of potassium  Cabbage -  cup has 150 mg of potassium  Carrots (cooked or raw) -  cup has 180 mg of potassium  Cauliflower (raw) -  cup has 150 mg of potassium  Celery (raw) -  cup has 155 mg of potassium  Cereal, bran flakes -  cup has 120-150 mg of potassium  Cheese (cottage) -  cup has 110 mg of potassium  Cherries - 10 have 150 mg of potassium  Chocolate - 1 oz bar has 165  mg of potassium  Coffee (brewed) - 6 oz has 90 mg of potassium  Corn -  cup or 1 ear has 195 mg of potassium  Cucumbers -  cup has 80 mg of  potassium  Egg - 1 large egg has 60 mg of potassium  Eggplant -  cup has 60 mg of potassium  Endive (raw) -  cup has 80 mg of potassium  English muffin - 1 has 65 mg of potassium  Fish (ocean perch) - 3 oz has 192 mg of potassium  Frankfurter, beef or pork - 1 has 75 mg of potassium  Fruit cocktail -  cup has 115 mg of potassium  Grape juice -  cup has 170 mg of potassium  Grapefruit -  fruit has 175 mg of potassium  Grapes -  cup has 155 mg of potassium  Greens: kale, turnip, collard -  cup has 110-150 mg of potassium  Ice cream or frozen yogurt (chocolate) -  cup has 175 mg of potassium  Ice cream or frozen yogurt (vanilla) -  cup has 120-150 mg of potassium  Lemons, limes - 1 each has 80 mg of potassium  Lettuce - 1 cup has 100 mg of potassium  Mixed vegetables -  cup has 150 mg of potassium  Mushrooms, raw -  cup has 110 mg of potassium  Nuts (walnuts, pecans, or macadamia) - 1 oz has 125 mg of potassium  Oatmeal -  cup has 80 mg of potassium  Okra -  cup has 110 mg of potassium  Onions -  cup has 120 mg of potassium  Peach - 1 has 185 mg of potassium  Peaches (canned) -  cup has 120 mg of potassium  Pears (canned) -  cup has 120 mg of potassium  Peas, green (frozen) -  cup has 90 mg of potassium  Peppers (Green) -  cup has 130 mg of potassium  Peppers (Red) -  cup has 160 mg of potassium  Pineapple juice -  cup has 165 mg of potassium  Pineapple (fresh or canned) -  cup has 100 mg of potassium  Plums - 1 has 105 mg of potassium  Pudding, vanilla -  cup has 150 mg of potassium  Raspberries -  cup has 90 mg of potassium  Rhubarb -  cup has 115 mg of potassium  Rice, wild -  cup has 80 mg of potassium  Shrimp - 3 oz has 155 mg of potassium  Spinach (raw) - 1 cup has 170 mg of potassium  Strawberries -  cup has 125 mg of potassium  Summer squash -  cup has 175-200 mg of potassium  Swiss chard (raw) - 1 cup has  135 mg of potassium  Tangerines - 1 fruit has 140 mg of potassium  Tea, brewed - 6 oz has 65 mg of potassium  Turnips -  cup has 140 mg of potassium  Watermelon -  cup has 85 mg of potassium  Wine (Red, table) - 5 oz has 180 mg of potassium  Wine (White, table) - 5 oz 100 mg of potassium Low in potassium The following foods and beverages have less than 50 mg of potassium per serving.  Bread (white) - 1 slice has 30 mg of potassium  Carbonated beverages - 12 oz has less than 5 mg of potassium  Cheese - 1 oz has 20-30 mg of potassium  Cranberries -  cup has 45 mg of potassium  Cranberry juice cocktail -  cup has 20 mg of potassium  Fats and oils - 1 Tbsp has less than 5 mg of potassium  Hummus - 1 Tbsp has 32 mg of potassium  Nectar (papaya, mango, or pear) -  cup has 35 mg of potassium  Rice (white or brown) -  cup has 50 mg of potassium  Spaghetti or macaroni (cooked) -  cup has 30 mg of potassium  Tortilla, flour or corn - 1 has 50 mg of potassium  Waffle - 1 four-inch waffle has 50 mg of potassium  Water chestnuts -  cup has 40 mg of potassium Summary  Potassium is a mineral found in many foods and drinks. It affects how the heart works, and helps keep fluids and minerals balanced in the body.  The amount of potassium you need each day depends on your age and any existing medical conditions you may have. Your health care provider or dietitian may recommend an amount of potassium that you should have each day. This information is not intended to replace advice given to you by your health care provider. Make sure you discuss any questions you have with your health care provider. Document Revised: 07/11/2017 Document Reviewed: 10/23/2016 Elsevier Patient Education  2021 [place], Erma Heritage, [place]  11/29/2020 7:14 PM    [facility] [address] [place], [place] [address] Phone: [phone] Fax: [phone]

## 2020-11-29 NOTE — Patient Instructions (Addendum)
Medication Instructions:  Your physician recommends that you continue on your current medications as directed. Please refer to the Current Medication list given to you today.  Call office to schedule heart cath. To be scheduled after May 5  *If you need a refill on your cardiac medications before your next appointment, please call your pharmacy*   Lab Work: Your physician recommends that you return for lab work in: 2 Weeks   If you have labs (blood work) drawn today and your tests are completely normal, you will receive your results only by: [facility] MyChart Message (if you have MyChart) OR . A paper copy in the mail If you have any lab test that is abnormal or we need to change your treatment, we will call you to review the results.   Testing/Procedures: Your physician has requested that you have a cardiac catheterization. Cardiac catheterization is used to diagnose and/or treat various heart conditions. Doctors may recommend this procedure for a number of different reasons. The most common reason is to evaluate chest pain. Chest pain can be a symptom of coronary artery disease (CAD), and cardiac catheterization can show whether plaque is narrowing or blocking your heart's arteries. This procedure is also used to evaluate the valves, as well as measure the blood flow and oxygen levels in different parts of your heart. For further information please visit [email or web address]. Please follow instruction sheet, as given.  Follow-Up: At [facility], you and your health needs are our priority.  As part of our continuing mission to provide you with exceptional heart care, we have created designated Provider Care Teams.  These Care Teams include your primary Cardiologist (physician) and Advanced Practice Providers (APPs -  Physician Assistants and Nurse Practitioners) who all work together to provide you with the care you need, when you need it.  We recommend signing up for the patient portal called  "MyChart".  Sign up information is provided on this After Visit Summary.  MyChart is used to connect with patients for Virtual Visits (Telemedicine).  Patients are able to view lab/test results, encounter notes, upcoming appointments, etc.  Non-urgent messages can be sent to your provider as well.   To learn more about what you can do with MyChart, go to [email or web address].    Your next appointment:   6-8  week(s)  The format for your next appointment:   In Person  Provider:   Bernerd Pho, PA-C   Other Instructions Thank you for choosing [facility]!    Potassium Content of Foods  Potassium is a mineral found in many foods and drinks. It affects how the heart works, and helps keep fluids and minerals balanced in the body. The amount of potassium you need each day depends on your age and any medical conditions you may have. Talk to your health care provider or dietitian about how much potassium you need. The following lists of foods provide the general serving size for foods and the approximate amount of potassium in each serving, listed in milligrams (mg). Actual values may vary depending on the product and how it is processed. High in potassium The following foods and beverages have 200 mg or more of potassium per serving:  Apricots (raw) - 2 have 200 mg of potassium.  Apricots (dry) - 5 have 200 mg of potassium.  Artichoke - 1 medium has 345 mg of potassium.  Avocado -  fruit has 245 mg of potassium.  Banana - 1 medium fruit has 425 mg of potassium.  [place] or baked beans (canned) -  cup has 280 mg of potassium.  White beans (canned) -  cup has 595 mg potassium.  Beef roast - 3 oz has 320 mg of potassium.  Ground beef - 3 oz has 270 mg of potassium.  Beets (raw or cooked) -  cup has 260 mg of potassium.  Bran muffin - 2 oz has 300 mg of potassium.  Broccoli (cooked) -  cup has 230 mg of potassium.  Brussels sprouts -  cup has 250 mg of  potassium.  Cantaloupe -  cup has 215 mg of potassium.  Cereal, 100% bran -  cup has 200-400 mg of potassium.  Cheeseburger -1 single fast food burger has 225-400 mg of potassium.  Chicken - 3 oz has 220 mg of potassium.  Clams (canned) - 3 oz has 535 mg of potassium.  Crab - 3 oz has 225 mg of potassium.  Dates - 5 have 270 mg of potassium.  Dried beans and peas -  cup has 300-475 mg of potassium.  Figs (dried) - 2 have 260 mg of potassium.  Fish (halibut, tuna, cod, snapper) - 3 oz has 480 mg of potassium.  Fish (salmon, haddock, swordfish, perch) - 3 oz has 300 mg of potassium.  Fish (tuna, canned) - 3 oz has 200 mg of potassium.  [place] fries (fast food) - 3 oz has 470 mg of potassium.  Granola with fruit and nuts -  cup has 200 mg of potassium.  Grapefruit juice -  cup has 200 mg of potassium.  Honeydew melon -  cup has 200 mg of potassium.  Kale (raw) - 1 cup has 300 mg of potassium.  Kiwi - 1 medium fruit has 240 mg of potassium.  Kohlrabi, rutabaga, parsnips -  cup has 280 mg of potassium.  Lentils -  cup has 365 mg of potassium.  Mango - 1 each has 325 mg of potassium.  Milk (nonfat, low-fat, whole, buttermilk) - 1 cup has 350-380 mg of potassium.  Milk (chocolate) - 1 cup has 420 mg of potassium  Molasses - 1 Tbsp has 295 mg of potassium.  Mushrooms -  cup has 280 mg of potassium.  Nectarine - 1 each has 275 mg of potassium.  Nuts (almonds, peanuts, hazelnuts, [place], cashew, mixed) - 1 oz has 200 mg of potassium.  Nuts (pistachios) - 1 oz has 295 mg of potassium.  Orange - 1 fruit has 240 mg of potassium.  Orange juice -  cup has 235 mg of potassium.  Papaya -  medium fruit has 390 mg of potassium.  Peanut butter (chunky) - 2 Tbsp has 240 mg of potassium.  Peanut butter (smooth) - 2 Tbsp has 210 mg of potassium.  Pear - 1 medium (200 mg) of potassium.  Pomegranate - 1 whole fruit has 400 mg of potassium.  Pomegranate juice  -  cup has 215 mg of potassium.  Pork - 3 oz has 350 mg of potassium.  Potato chips (salted) - 1 oz has 465 mg of potassium.  Potato (baked with skin) - 1 medium has 925 mg of potassium.  Potato (boiled) -  cup has 255 mg of potassium.  Potato (Mashed) -  cup has 330 mg of potassium.  Prune juice -  cup has 370 mg of potassium.  Prunes - 5 have 305 mg of potassium.  Pudding (chocolate) -  cup has 230 mg of potassium.  Pumpkin (canned) -  cup has 250 mg  of potassium.  Raisins (seedless) -  cup has 270 mg of potassium.  Seeds (sunflower or pumpkin) - 1 oz has 240 mg of potassium.  Soy milk - 1 cup has 300 mg of potassium.  Spinach (cooked) - 1/2 cup has 420 mg of potassium.  Spinach (canned) -  cup has 370 mg of potassium.  Sweet potato (baked with skin) - 1 medium has 450 mg of potassium.  Swiss chard -  cup has 480 mg of potassium.  Tomato or vegetable juice -  cup has 275 mg of potassium.  Tomato (sauce or puree) -  cup has 400-550 mg of potassium.  Tomato (raw) - 1 medium has 290 mg of potassium.  Tomato (canned) -  cup has 200-300 mg of potassium.  [place] - 3 oz has 250 mg of potassium.  Wheat germ - 1 oz has 250 mg of potassium.  Winter squash -  cup has 250 mg of potassium.  Yogurt (plain or fruited) - 6 oz has 260-435 mg of potassium.  Zucchini -  cup has 220 mg of potassium. Moderate in potassium The following foods and beverages have 50-200 mg of potassium per serving:  Apple - 1 fruit has 150 mg of potassium  Apple juice -  cup has 150 mg of potassium  Applesauce -  cup has 90 mg of potassium  Apricot nectar -  cup has 140 mg of potassium  Asparagus (small spears) -  cup has 155 mg of potassium  Asparagus (large spears) - 6 have 155 mg of potassium  Bagel (cinnamon raisin) - 1 four-inch bagel has 130 mg of potassium  Bagel (egg or plain) - 1 four- inch bagel has 70 mg of potassium  Beans (green) -  cup has 90 mg of  potassium  Beans (yellow) -  cup has 190 mg of potassium  Beer, regular - 12 oz has 100 mg of potassium  Beets (canned) -  cup has 125 mg of potassium  Blackberries -  cup has 115 mg of potassium  Blueberries -  cup has 60 mg of potassium  Bread (whole wheat) - 1 slice has 70 mg of potassium  Broccoli (raw) -  cup has 145 mg of potassium  Cabbage -  cup has 150 mg of potassium  Carrots (cooked or raw) -  cup has 180 mg of potassium  Cauliflower (raw) -  cup has 150 mg of potassium  Celery (raw) -  cup has 155 mg of potassium  Cereal, bran flakes -  cup has 120-150 mg of potassium  Cheese (cottage) -  cup has 110 mg of potassium  Cherries - 10 have 150 mg of potassium  Chocolate - 1 oz bar has 165 mg of potassium  Coffee (brewed) - 6 oz has 90 mg of potassium  Corn -  cup or 1 ear has 195 mg of potassium  Cucumbers -  cup has 80 mg of potassium  Egg - 1 large egg has 60 mg of potassium  Eggplant -  cup has 60 mg of potassium  Endive (raw) -  cup has 80 mg of potassium  English muffin - 1 has 65 mg of potassium  Fish (ocean perch) - 3 oz has 192 mg of potassium  Frankfurter, beef or pork - 1 has 75 mg of potassium  Fruit cocktail -  cup has 115 mg of potassium  Grape juice -  cup has 170 mg of potassium  Grapefruit -  fruit has 175  mg of potassium  Grapes -  cup has 155 mg of potassium  Greens: kale, turnip, collard -  cup has 110-150 mg of potassium  Ice cream or frozen yogurt (chocolate) -  cup has 175 mg of potassium  Ice cream or frozen yogurt (vanilla) -  cup has 120-150 mg of potassium  Lemons, limes - 1 each has 80 mg of potassium  Lettuce - 1 cup has 100 mg of potassium  Mixed vegetables -  cup has 150 mg of potassium  Mushrooms, raw -  cup has 110 mg of potassium  Nuts (walnuts, pecans, or macadamia) - 1 oz has 125 mg of potassium  Oatmeal -  cup has 80 mg of potassium  Okra -  cup has 110 mg of  potassium  Onions -  cup has 120 mg of potassium  Peach - 1 has 185 mg of potassium  Peaches (canned) -  cup has 120 mg of potassium  Pears (canned) -  cup has 120 mg of potassium  Peas, green (frozen) -  cup has 90 mg of potassium  Peppers (Green) -  cup has 130 mg of potassium  Peppers (Red) -  cup has 160 mg of potassium  Pineapple juice -  cup has 165 mg of potassium  Pineapple (fresh or canned) -  cup has 100 mg of potassium  Plums - 1 has 105 mg of potassium  Pudding, vanilla -  cup has 150 mg of potassium  Raspberries -  cup has 90 mg of potassium  Rhubarb -  cup has 115 mg of potassium  Rice, wild -  cup has 80 mg of potassium  Shrimp - 3 oz has 155 mg of potassium  Spinach (raw) - 1 cup has 170 mg of potassium  Strawberries -  cup has 125 mg of potassium  Summer squash -  cup has 175-200 mg of potassium  Swiss chard (raw) - 1 cup has 135 mg of potassium  Tangerines - 1 fruit has 140 mg of potassium  Tea, brewed - 6 oz has 65 mg of potassium  Turnips -  cup has 140 mg of potassium  Watermelon -  cup has 85 mg of potassium  Wine (Red, table) - 5 oz has 180 mg of potassium  Wine (White, table) - 5 oz 100 mg of potassium Low in potassium The following foods and beverages have less than 50 mg of potassium per serving.  Bread (white) - 1 slice has 30 mg of potassium  Carbonated beverages - 12 oz has less than 5 mg of potassium  Cheese - 1 oz has 20-30 mg of potassium  Cranberries -  cup has 45 mg of potassium  Cranberry juice cocktail -  cup has 20 mg of potassium  Fats and oils - 1 Tbsp has less than 5 mg of potassium  Hummus - 1 Tbsp has 32 mg of potassium  Nectar (papaya, mango, or pear) -  cup has 35 mg of potassium  Rice (white or brown) -  cup has 50 mg of potassium  Spaghetti or macaroni (cooked) -  cup has 30 mg of potassium  Tortilla, flour or corn - 1 has 50 mg of potassium  Waffle - 1 four-inch waffle has  50 mg of potassium  Water chestnuts -  cup has 40 mg of potassium Summary  Potassium is a mineral found in many foods and drinks. It affects how the heart works, and helps keep fluids and minerals balanced in  the body.  The amount of potassium you need each day depends on your age and any existing medical conditions you may have. Your health care provider or dietitian may recommend an amount of potassium that you should have each day. This information is not intended to replace advice given to you by your health care provider. Make sure you discuss any questions you have with your health care provider. Document Revised: 07/11/2017 Document Reviewed: 10/23/2016 Elsevier Patient Education  [place].

## 2020-11-30 ENCOUNTER — Ambulatory Visit: Payer: 59 | Admitting: *Deleted

## 2020-11-30 DIAGNOSIS — I5082 Biventricular heart failure: Secondary | ICD-10-CM

## 2020-11-30 DIAGNOSIS — K7030 Alcoholic cirrhosis of liver without ascites: Secondary | ICD-10-CM

## 2020-11-30 NOTE — Patient Instructions (Signed)
Visit Information  PATIENT GOALS:  Goals Addressed            This Visit's Progress   . Maintain My Quality of Life related to managment of cirrhosis       Timeframe:  Long-Range Goal Priority:  High Start Date:      01/04/2021                       Expected End Date:     06/06/2021                  Follow Up Date 01/04/2021   - complete a living will/ health care power of attorney which has been mailed to you - discuss my treatment options with the doctor or nurse - make shared treatment decisions with doctor - spend time outdoors at least 3 times a week - continue walking and lifting light weights - take medications as prescribed - expect a call from social worker and care guide for resources and help with depression   Why is this important?    Having a long-term illness can be scary.   It can also be stressful for you and your caregiver.   These steps may help.    Notes:     . Track and Manage Symptoms-Heart Failure       Timeframe:  Long-Range Goal Priority:  High Start Date:         11/30/2020                    Expected End Date:    06/01/2021                   Follow Up Date 01/04/2021   - develop a rescue plan - eat more whole grains, fruits and vegetables, lean meats and healthy fats - follow rescue plan if symptoms flare-up - know when to call the doctor - track symptoms and what helps feel better or worse  - call doctor if have 3 pound weight gain in one day or 5 pounds in one week    Why is this important?    You will be able to handle your symptoms better if you keep track of them.   Making some simple changes to your lifestyle will help.   Eating healthy is one thing you can do to take good care of yourself.    Notes:        Jordan Kelly was given information about Care Management services by the embedded care coordination team including:  1. Care Management services include personalized support from designated clinical staff supervised by his  physician, including individualized plan of care and coordination with other care providers 2. 24/7 contact phone numbers for assistance for urgent and routine care needs. 3. The patient may stop CCM services at any time (effective at the end of the month) by phone call to the office staff.  Patient agreed to services and verbal consent obtained.   The patient verbalized understanding of instructions, educational materials, and care plan provided today and agreed to receive a mailed copy of patient instructions, educational materials, and care plan.   Telephone follow up appointment with care management team member scheduled for:   01/04/2021  Jacqlyn Larsen [facility], BSN RN Case Manager [facility] [phone]     Cirrhosis  Cirrhosis is long-term (chronic) liver injury. The liver is the body's largest internal organ, and it performs many functions. It  converts food into energy, removes toxic material from the blood, makes important proteins, and absorbs necessary vitamins from food. In cirrhosis, healthy liver cells are replaced by scar tissue. This prevents blood from flowing through the liver and makes it difficult for the liver to complete its functions. What are the causes? Common causes of this condition are hepatitis C and long-term alcohol abuse. Other causes include:  Nonalcoholic fatty liver disease (NAFLD). This happens when fat is deposited in the liver by causes other than alcohol.  Hepatitis B infection.  Autoimmune hepatitis. In this condition, the body's defense system (immune system) mistakenly attacks the liver cells, causing inflammation.  Diseases that cause blockage of ducts inside the liver.  Inherited liver diseases, such as hemochromatosis. This is one of the most common inherited liver diseases. In this disease, deposits of iron collect in the liver and other organs.  Reactions to certain long-term medicines, such as amiodarone, a heart  medicine.  Parasitic infections. These include schistosomiasis, which is caused by a flatworm.  Long-term contact to certain toxins. These toxins include certain organic solvents, such as toluene and chloroform. What increases the risk? You are more likely to develop this condition if:  You have certain types of viral hepatitis.  You abuse alcohol, especially if you are male.  You are overweight.  You use IV drugs and share needles.  You have unprotected sex with someone who has viral hepatitis. What are the signs or symptoms? You may not have any signs and symptoms at first. Symptoms may not develop until the damage to your liver starts to get worse. Early symptoms may include:  Weakness and tiredness (fatigue).  Changes in sleep patterns or having trouble sleeping.  Itchiness.  Tenderness in the right-upper part of your abdomen.  Weight loss and muscle loss.  Nausea.  Loss of appetite. Later symptoms may include:  Fatigue or weakness that is getting worse.  Yellow skin and eyes (jaundice).  Buildup of fluid in the abdomen (ascites). You may notice that your clothes are tight around your waist.  Weight gain and swelling of the feet and ankles (edema).  Trouble breathing.  Easy bruising and bleeding.  Vomiting blood, or black or bloody stool.  Mental confusion. How is this diagnosed? Your health care provider may suspect cirrhosis based on your symptoms and medical history, especially if you have other medical conditions or a history of alcohol abuse. Your health care provider will do a physical exam to feel your liver and to check for signs of cirrhosis. Tests may include:  Blood tests to check: ? For hepatitis B or C. ? Kidney function. ? Liver function.  Imaging tests such as: ? MRI or CT scan to look for changes seen in advanced cirrhosis. ? Ultrasound to see if normal liver tissue is being replaced by scar tissue.  A procedure in which a long  needle is used to take a sample of liver tissue to be checked in a lab (biopsy). Liver biopsy can confirm the diagnosis of cirrhosis. How is this treated? Treatment for this condition depends on how damaged your liver is and what caused the damage. It may include treating the symptoms of cirrhosis, or treating the underlying causes to slow the damage. Treatment may include:  Making lifestyle changes, such as: ? Eating a healthy diet. You may need to work with your health care provider or a dietitian to develop an eating plan. ? Restricting salt intake. ? Maintaining a healthy weight. ? Not abusing  drugs or alcohol.  Taking medicines to: ? Treat liver infections or other infections. ? Control itching. ? Reduce fluid buildup. ? Reduce certain blood toxins. ? Reduce risk of bleeding from enlarged blood vessels in the stomach or esophagus (varices).  Liver transplant. In this procedure, a liver from a donor is used to replace your diseased liver. This is done if cirrhosis has caused liver failure. Other treatments and procedures may be done depending on the problems that you get from cirrhosis. Common problems include liver-related kidney failure (hepatorenal syndrome). Follow these instructions at home:  Take medicines only as told by your health care provider. Do not use medicines that are toxic to your liver. Ask your health care provider before taking any new medicines, including over-the-counter medicines such as NSAIDs.  Rest as needed.  Eat a well-balanced diet.  Limit your salt or water intake, if your health care provider asks you to do this.  Do not drink alcohol. This is especially important if you routinely take acetaminophen.  Keep all follow-up visits. This is important.   Contact a health care provider if you:  Have fatigue or weakness that is getting worse.  Develop swelling of the hands, feet, or legs, or a buildup of fluid in the abdomen (ascites).  Have a fever or  chills.  Develop loss of appetite.  Have nausea or vomiting.  Develop jaundice.  Develop easy bruising or bleeding. Get help right away if you:  Vomit bright red blood or a material that looks like coffee grounds.  Have blood in your stools.  Notice that your stools appear black and tarry.  Become confused.  Have chest pain or trouble breathing. These symptoms may represent a serious problem that is an emergency. Do not wait to see if the symptoms will go away. Get medical help right away. Call your local emergency services (911 in the [place] Do not drive yourself to the hospital. Summary  Cirrhosis is chronic liver injury. Common causes are hepatitis C and long-term alcohol abuse.  Tests used to diagnose cirrhosis include blood tests, imaging tests, and liver biopsy.  Treatment for this condition involves treating the underlying cause. Avoid alcohol, drugs, salt, and medicines that may damage your liver.  Get help right away if you vomit bright red blood or a material that looks like coffee grounds. This information is not intended to replace advice given to you by your health care provider. Make sure you discuss any questions you have with your health care provider. Document Revised: 05/11/2020 Document Reviewed: 05/11/2020 Elsevier Patient Education  2021 [place].  Critical care medicine: Principles of diagnosis and management in the adult (4th ed., pp. 4431-5400). [place]unders."> Miller's anesthesia (8th ed., pp. 232-250). [employer]unders.">  Advance Directive  Advance directives are legal documents that allow you to make decisions about your health care and medical treatment in case you become unable to communicate for yourself. Advance directives let your wishes be known to family, friends, and health care providers. Discussing and writing advance directives should happen over time rather than all at once. Advance directives can be changed and updated at any time. There are  different types of advance directives, such as:  Medical power of attorney.  Living will.  Do not resuscitate (DNR) order or do not attempt resuscitation (DNAR) order. Health care proxy and medical power of attorney A health care proxy is also called a health care agent. This person is appointed to make medical decisions for you when you are unable  to make decisions for yourself. Generally, people ask a trusted friend or family member to act as their proxy and represent their preferences. Make sure you have an agreement with your trusted person to act as your proxy. A proxy may have to make a medical decision on your behalf if your wishes are not known. A medical power of attorney, also called a durable power of attorney for health care, is a legal document that names your health care proxy. Depending on the laws in your state, the document may need to be:  Signed.  Notarized.  Dated.  Copied.  Witnessed.  Incorporated into your medical record. You may also want to appoint a trusted person to manage your money in the event you are unable to do so. This is called a durable power of attorney for finances. It is a separate legal document from the durable power of attorney for health care. You may choose your health care proxy or someone different to act as your agent in money matters. If you do not appoint a proxy, or there is a concern that the proxy is not acting in your best interest, a court may appoint a guardian to act on your behalf. Living will A living will is a set of instructions that state your wishes about medical care when you cannot express them yourself. Health care providers should keep a copy of your living will in your medical record. You may want to give a copy to family members or friends. To alert caregivers in case of an emergency, you can place a card in your wallet to let them know that you have a living will and where they can find it. A living will is used if you  become:  Terminally ill.  Disabled.  Unable to communicate or make decisions. The following decisions should be included in your living will:  To use or not to use life support equipment, such as dialysis machines and breathing machines (ventilators).  Whether you want a DNR or DNAR order. This tells health care providers not to use cardiopulmonary resuscitation (CPR) if breathing or heartbeat stops.  To use or not to use tube feeding.  To be given or not to be given food and fluids.  Whether you want comfort (palliative) care when the goal becomes comfort rather than a cure.  Whether you want to donate your organs and tissues. A living will does not give instructions for distributing your money and property if you should pass away. DNR or DNAR A DNR or DNAR order is a request not to have CPR in the event that your heart stops beating or you stop breathing. If a DNR or DNAR order has not been made and shared, a health care provider will try to help any patient whose heart has stopped or who has stopped breathing. If you plan to have surgery, talk with your health care provider about how your DNR or DNAR order will be followed if problems occur. What if I do not have an advance directive? Some states assign family decision makers to act on your behalf if you do not have an advance directive. Each state has its own laws about advance directives. You may want to check with your health care provider, attorney, or state representative about the laws in your state. Summary  Advance directives are legal documents that allow you to make decisions about your health care and medical treatment in case you become unable to communicate for yourself.  The  process of discussing and writing advance directives should happen over time. You can change and update advance directives at any time.  Advance directives may include a medical power of attorney, a living will, and a DNR or DNAR order. This  information is not intended to replace advice given to you by your health care provider. Make sure you discuss any questions you have with your health care provider. Document Revised: 05/02/2020 Document Reviewed: 05/02/2020 Elsevier Patient Education  2021 [employer].

## 2020-11-30 NOTE — Chronic Care Management (AMB) (Signed)
Care Management    RN Visit Note  11/30/2020 Name: Jordan Kelly MRN: [id number] DOB: [birth year]  Subjective: Jordan Kelly is a 60 y.o. year old male who is a primary care patient of Jordan Larsson, NP. The care management team was consulted for assistance with disease management and care coordination needs.    Engaged with patient by telephone for initial visit in response to provider referral for case management and/or care coordination services.   Consent to Services:   Jordan Kelly was given information about Care Management services today including:  1. Care Management services includes personalized support from designated clinical staff supervised by his physician, including individualized plan of care and coordination with other care providers 2. 24/7 contact phone numbers for assistance for urgent and routine care needs. 3. The patient may stop case management services at any time by phone call to the office staff.  Patient agreed to services and consent obtained.   Assessment: Review of patient past medical history, allergies, medications, health status, including review of consultants reports, laboratory and other test data, was performed as part of comprehensive evaluation and provision of chronic care management services.   SDOH (Social Determinants of Health) assessments and interventions performed:  SDOH Interventions   Flowsheet Row Most Recent Value  SDOH Interventions   Food Insecurity Interventions Intervention Not Indicated  [pt has food at present]  Transportation Interventions Intervention Not Indicated  Depression Interventions/Treatment  --  [referral to social worker]       Care Plan  No Known Allergies  Outpatient Encounter Medications as of 11/30/2020  Medication Sig  . albuterol (VENTOLIN HFA) 108 (90 Base) MCG/ACT inhaler Inhale 2 puffs into the lungs every 6 (six) hours as needed for wheezing or shortness of breath. Dx J44.1--- COPD  . apixaban  (ELIQUIS) 5 MG TABS tablet Take 1 tablet (5 mg total) by mouth 2 (two) times daily.  . bisoprolol (ZEBETA) 5 MG tablet Take 1 tablet (5 mg total) by mouth daily.  . Multiple Vitamin (MULTIVITAMIN WITH MINERALS) TABS tablet Take 1 tablet by mouth daily.  . nicotine (NICODERM CQ - DOSED IN MG/24 HOURS) 21 mg/24hr patch Place 1 patch (21 mg total) onto the skin daily.  . ondansetron (ZOFRAN-ODT) 4 MG disintegrating tablet Take 1 tablet (4 mg total) by mouth every 8 (eight) hours as needed for nausea or vomiting.  . sodium phosphate (FLEET) 7-19 GM/118ML ENEM Place 133 mLs (1 enema total) rectally daily as needed for severe constipation.  Marland Kitchen spironolactone (ALDACTONE) 25 MG tablet Take 1/2 tablet (12.5 mg total) by mouth daily.  Marland Kitchen torsemide (DEMADEX) 20 MG tablet Take 1 tablet (20 mg total) by mouth daily.  Marland Kitchen triamcinolone cream (KENALOG) 0.5 % Apply topically 2 (two) times daily.  . bisacodyl (DULCOLAX) 10 MG suppository Place 1 suppository (10 mg total) rectally as needed for moderate constipation. (Patient not taking: Reported on 11/30/2020)   No facility-administered encounter medications on file as of 11/30/2020.    Patient Active Problem List   Diagnosis Date Noted  . Pulmonary hypertension, primary ([place]) 11/28/2020  . PFO (patent foramen ovale) 11/14/2020  . Cirrhosis ([place]) 11/14/2020  . Psoriasis 11/14/2020  . AKI (acute kidney injury) ([place]) 11/14/2020  . Acute cor pulmonale ([place]) 11/13/2020  . CHF (congestive heart failure) ([place]) 11/05/2020  . Congestive heart failure (CHF) ([place]) 11/04/2020  . Atrial flutter with rapid ventricular response ([place]) 11/04/2020  . Encounter to establish care 10/30/2020  . Obstructive chronic bronchitis without  exacerbation ([place]) 10/30/2020  . Leg swelling 10/30/2020  . Constipation 10/30/2020  . Nausea 10/30/2020  . Tobacco abuse 01/09/2019  . Alcohol abuse 01/09/2019    Conditions to be addressed/monitored: CHF, Cirrhosis  Care Plan : Heart Failure  (Adult)  Updates made by Jordan Mends, RN since 11/30/2020 12:00 AM    Problem: Symptom Exacerbation (Heart Failure)     Goal: Symptom Exacerbation Prevented or Minimized   Start Date: 11/30/2020  Expected End Date: 06/01/2021  This Visit's Progress: On track  Priority: High  Note:   Current Barriers:  Marland Kitchen Knowledge deficit related to basic heart failure pathophysiology and self care management- needs reinforcement of CHF action plan . Financial strain- patient works full time [employer] and is out of work due to CHF exacerbation and fatigue, plans to return to work part time next week. Case Manager Clinical Goal(s):  . patient will weigh self daily and record . patient will verbalize understanding of Heart Failure Action Plan and when to call doctor . patient will take all Heart Failure mediations as prescribed . patient will weigh daily and record (notifying MD of 3 lb weight gain over night or 5 lb in a week) Interventions:  . Collaboration with Jordan Larsson, NP regarding development and update of comprehensive plan of care as evidenced by provider attestation and co-signature . Inter-disciplinary care team collaboration (see longitudinal plan of care) . Basic overview and discussion of pathophysiology of Heart Failure reviewed  . Provided verbal education on low sodium diet . Assessed need for readable accurate scales in home . Discussed importance of daily weight and advised patient to weigh and record daily . Reviewed role of diuretics in prevention of fluid overload and management of heart failure and importance of taking as prescribed . Reviewed importance of calling doctor for exacerbation of HF symptoms such as weight gain, edema, etc. . Order sent for Care Guide and Social work outreach for financial strain, applying for assistance. Self-Care Activities: . Self administers medications as prescribed . Attends all scheduled provider appointments . Attends church  or other social activities . Calls provider office for new concerns or questions Patient Goals: - develop a rescue plan - eat more whole grains, fruits and vegetables, lean meats and healthy fats - follow rescue plan if symptoms flare-up - know when to call the doctor - track symptoms and what helps feel better or worse  - call doctor if have 3 pound weight gain in one day or 5 pounds in one week Follow Up Plan: Telephone follow up appointment with care management team member scheduled for: 01/04/2021   Care Plan : Cirrhosis  Updates made by Jordan Mends, RN since 11/30/2020 12:00 AM    Problem: Health Promotion or Disease Self-Management of Cirrhosis   Priority: High  NoGoal: Self-Management Plan Developedte Start Date: 11/30/2020 Expected End Date: 06/01/2021 This Visit's Progress:  On Track Priority:  High   Current Barriers:   Ineffective Self Health Maintenance - Does not have HCPOA and living will in place, pt is interested and requests to be mailed to his home.  Financial concerns, has no source of income at present, will try to start back to work next week part time.  Rides a moped and sister transports patient to out of town appointments, does not feel he needs to utilize [facility] transportation at present.  Has samples of eliquis but will not be able to afford this medication. Clinical Goal(s):  Marland Kitchen Collaboration with Pearline Cables,  Trinna Balloon, NP regarding development and update of comprehensive plan of care as evidenced by provider attestation and co-signature . Inter-disciplinary care team collaboration (see longitudinal plan of care)  patient will work with care management team to address care coordination and chronic disease management needs related to Disease Management  Medication Management and Education  Medication Assistance   Mental Health Counseling   Interventions:   Evaluation of current treatment plan related to Cirrhosis, Financial constraints related to currently  out of work, no income, plans to start back to work part time next week,  Medication procurement- unable to afford eliquis and Mental Health Concerns  self-management and patient's adherence to plan as established by provider.  Collaboration with Jordan Larsson, NP regarding development and update of comprehensive plan of care as evidenced by provider attestation       and co-signature  Inter-disciplinary care team collaboration (see longitudinal plan of care)  Discussed plans with patient for ongoing care management follow up and provided patient with direct contact information for care management team  Referral placed for Care Guide (for resources financial, food, applying for assistance etc), social work for management of depression and pharmacy for medication assistance (eliquis).   Reinforced with patient importance of taking all medications as prescribed  Call doctor for any new concerns such as fluid retention  Reviewed [facility] transportation services if pt should ever need it  Reviewed importance of avoiding medications such as tylenol Self Care Activities:  . Attends all scheduled provider appointments . Calls pharmacy for medication refills . Attends church or other social activities . Calls provider office for new concerns or questions Patient Goals:  - complete a living will/ health care power of attorney which has been mailed to you - discuss my treatment options with the doctor or nurse - make shared treatment decisions with doctor - spend time outdoors at least 3 times a week - continue walking and lifting light weights - take medications as prescribed - expect a call from social worker and care guide for resources and help with depression Follow Up Plan:  Telephone follow up appointment with care management team member scheduled for: 01/04/2021     Note:      Plan: Telephone follow up appointment with care management team member scheduled for:  01/04/2021  Jacqlyn Larsen [facility], BSN RN Case Manager [facility] [phone]

## 2020-12-04 ENCOUNTER — Telehealth: Payer: Self-pay | Admitting: Nurse Practitioner

## 2020-12-04 NOTE — Telephone Encounter (Signed)
   Telephone encounter was:  Successful.  12/04/2020 Name: [name] MRN: [id number] DOB: 13-Nov-1960  Jordan Kelly is a 60 y.o. year old male who is a primary care patient of Noreene Larsson, NP . The community resource team was consulted for assistance with Food Insecurity and Financial Difficulties related to needing disability  Care guide performed the following interventions: Patient provided with information about care guide support team and interviewed to confirm resource needs Discussed resources to assist with needs in his county for disability services and also for food pantries in the area. pt does not do email so this information will need to be mailed via postal service to him. .  Follow Up Plan:  Care guide will follow up with patient by phone over the next week  [place], Care Management Phone: [phone] Email: [email or web address]

## 2020-12-05 ENCOUNTER — Telehealth: Payer: Self-pay | Admitting: Nurse Practitioner

## 2020-12-05 NOTE — Telephone Encounter (Signed)
   Telephone encounter was:  Successful.  12/05/2020 Name: Jordan Kelly MRN: [id number] DOB: October 30, 1960  Jordan Kelly is a 60 y.o. year old male who is a primary care patient of Noreene Larsson, NP . The community resource team was consulted for assistance with food insecurity and getting help with disabilty forms  Care guide performed the following interventions: Discussed resources to assist with sending the pt a letter with a list of food banks in his area, the application for disability and also a list of what he will need to send to the disability program with a name of Ginette Otto of the [facility] for him to contact when he gets the applicaiton. She will help him fill out and file for disability. .  Follow Up Plan:  No further follow up planned at this time. The patient has been provided with needed resources.  [place], Care Management Phone: [phone] Email: [email or web address]

## 2020-12-07 ENCOUNTER — Ambulatory Visit: Payer: Self-pay | Admitting: *Deleted

## 2020-12-07 ENCOUNTER — Other Ambulatory Visit: Payer: Self-pay | Admitting: *Deleted

## 2020-12-07 DIAGNOSIS — I50813 Acute on chronic right heart failure: Secondary | ICD-10-CM

## 2020-12-07 NOTE — Chronic Care Management (AMB) (Signed)
Care Management    RN Visit Note  12/07/2020 Name: Jordan Kelly MRN: [id number] DOB: 03-11-1961  Subjective: Jordan Kelly is a 60 y.o. year old male who is a primary care patient of Noreene Larsson, NP. The care management team was consulted for assistance with disease management and care coordination needs.    Engaged with patient by telephone for follow up visit in response to provider referral for case management and/or care coordination services.   Consent to Services:   Mr. Andujo was given information about Care Management services today including:  1. Care Management services includes personalized support from designated clinical staff supervised by his physician, including individualized plan of care and coordination with other care providers 2. 24/7 contact phone numbers for assistance for urgent and routine care needs. 3. The patient may stop case management services at any time by phone call to the office staff.  Patient agreed to services and consent obtained.   Assessment: Review of patient past medical history, allergies, medications, health status, including review of consultants reports, laboratory and other test data, was performed as part of comprehensive evaluation and provision of chronic care management services.   SDOH (Social Determinants of Health) assessments and interventions performed:    Care Plan  No Known Allergies  Outpatient Encounter Medications as of 12/07/2020  Medication Sig  . albuterol (VENTOLIN HFA) 108 (90 Base) MCG/ACT inhaler Inhale 2 puffs into the lungs every 6 (six) hours as needed for wheezing or shortness of breath. Dx J44.1--- COPD  . apixaban (ELIQUIS) 5 MG TABS tablet Take 1 tablet (5 mg total) by mouth 2 (two) times daily.  . bisacodyl (DULCOLAX) 10 MG suppository Place 1 suppository (10 mg total) rectally as needed for moderate constipation. (Patient not taking: Reported on 11/30/2020)  . bisoprolol (ZEBETA) 5 MG tablet Take 1 tablet  (5 mg total) by mouth daily.  . Multiple Vitamin (MULTIVITAMIN WITH MINERALS) TABS tablet Take 1 tablet by mouth daily.  . nicotine (NICODERM CQ - DOSED IN MG/24 HOURS) 21 mg/24hr patch Place 1 patch (21 mg total) onto the skin daily.  . ondansetron (ZOFRAN-ODT) 4 MG disintegrating tablet Take 1 tablet (4 mg total) by mouth every 8 (eight) hours as needed for nausea or vomiting.  . sodium phosphate (FLEET) 7-19 GM/118ML ENEM Place 133 mLs (1 enema total) rectally daily as needed for severe constipation.  Marland Kitchen spironolactone (ALDACTONE) 25 MG tablet Take 1/2 tablet (12.5 mg total) by mouth daily.  Marland Kitchen torsemide (DEMADEX) 20 MG tablet Take 1 tablet (20 mg total) by mouth daily.  Marland Kitchen triamcinolone cream (KENALOG) 0.5 % Apply topically 2 (two) times daily.   No facility-administered encounter medications on file as of 12/07/2020.    Patient Active Problem List   Diagnosis Date Noted  . Pulmonary hypertension, primary ([place]) 11/28/2020  . PFO (patent foramen ovale) 11/14/2020  . Cirrhosis ([place]) 11/14/2020  . Psoriasis 11/14/2020  . AKI (acute kidney injury) ([place]) 11/14/2020  . Acute cor pulmonale ([place]) 11/13/2020  . CHF (congestive heart failure) ([place]) 11/05/2020  . Congestive heart failure (CHF) ([place]) 11/04/2020  . Atrial flutter with rapid ventricular response ([place]) 11/04/2020  . Encounter to establish care 10/30/2020  . Obstructive chronic bronchitis without exacerbation ([place]) 10/30/2020  . Leg swelling 10/30/2020  . Constipation 10/30/2020  . Nausea 10/30/2020  . Tobacco abuse 01/09/2019  . Alcohol abuse 01/09/2019    Conditions to be addressed/monitored: CHF  Care Plan : Heart Failure (Adult)  Updates made by  Kassie Mends, RN since 12/07/2020 12:00 AM    Problem: Symptom Exacerbation (Heart Failure)     Goal: Symptom Exacerbation Prevented or Minimized   Start Date: 11/30/2020  Expected End Date: 06/01/2021  Recent Progress: On track  Priority: High  Note:   Current Barriers:   Marland Kitchen Knowledge deficit related to basic heart failure pathophysiology and self care management- needs reinforcement of CHF action plan . Financial strain- patient works full time [employer] and is out of work due to CHF exacerbation and fatigue, plans to return to work part time next week. Case Manager Clinical Goal(s):  . patient will weigh self daily and record . patient will verbalize understanding of Heart Failure Action Plan and when to call doctor . patient will take all Heart Failure mediations as prescribed . patient will weigh daily and record (notifying MD of 3 lb weight gain over night or 5 lb in a week) Interventions:  . Collaboration with Noreene Larsson, NP regarding development and update of comprehensive plan of care as evidenced by provider attestation and co-signature . Inter-disciplinary care team collaboration (see longitudinal plan of care) . Basic overview and discussion of pathophysiology of Heart Failure reviewed  . Provided verbal education on low sodium diet . Assessed need for readable accurate scales in home . Discussed importance of daily weight and advised patient to weigh and record daily . Reviewed role of diuretics in prevention of fluid overload and management of heart failure and importance of taking as prescribed . Reviewed importance of calling doctor for exacerbation of HF symptoms such as weight gain, edema, etc. . Order sent for Care Guide and Social work outreach for financial strain, applying for assistance. . 12/07/20- Provided patient with contact number for Eliquis Savings Card [phone] (pt read number back to RN care manager) . 12/07/20- RN care manager asked patient to call RN care manager back if he had any issues with obtaining the savings card (pt still has samples of eliquis and is taking as prescribed) Self-Care Activities: . Self administers medications as prescribed . Attends all scheduled provider appointments . Attends church  or other social activities . Calls provider office for new concerns or questions Patient Goals: - develop a rescue plan - eat more whole grains, fruits and vegetables, lean meats and healthy fats - follow rescue plan if symptoms flare-up - know when to call the doctor - track symptoms and what helps feel better or worse  - call doctor if have 3 pound weight gain in one day or 5 pounds in one week Follow Up Plan: Telephone follow up appointment with care management team member scheduled for: 01/04/2021     Plan: Telephone follow up appointment with care management team member scheduled for:  01/04/21  Jacqlyn Larsen [facility], BSN RN Case Manager [facility] [phone]

## 2020-12-07 NOTE — Patient Instructions (Signed)
Visit Information  Goals Addressed   None

## 2020-12-12 ENCOUNTER — Other Ambulatory Visit: Payer: Self-pay

## 2020-12-12 ENCOUNTER — Other Ambulatory Visit (HOSPITAL_COMMUNITY): Payer: Self-pay

## 2020-12-12 ENCOUNTER — Encounter: Payer: Self-pay | Admitting: Nurse Practitioner

## 2020-12-12 ENCOUNTER — Ambulatory Visit (INDEPENDENT_AMBULATORY_CARE_PROVIDER_SITE_OTHER): Payer: 59 | Admitting: Nurse Practitioner

## 2020-12-12 VITALS — BP 104/72 | HR 73 | Temp 98.2°F | Resp 20 | Ht 70.5 in | Wt 158.0 lb

## 2020-12-12 DIAGNOSIS — Z0000 Encounter for general adult medical examination without abnormal findings: Secondary | ICD-10-CM

## 2020-12-12 DIAGNOSIS — I50813 Acute on chronic right heart failure: Secondary | ICD-10-CM | POA: Diagnosis not present

## 2020-12-12 DIAGNOSIS — R932 Abnormal findings on diagnostic imaging of liver and biliary tract: Secondary | ICD-10-CM | POA: Diagnosis not present

## 2020-12-12 DIAGNOSIS — J449 Chronic obstructive pulmonary disease, unspecified: Secondary | ICD-10-CM

## 2020-12-12 DIAGNOSIS — J439 Emphysema, unspecified: Secondary | ICD-10-CM

## 2020-12-12 NOTE — Patient Instructions (Signed)
Please have fasting labs drawn this week.  I sent in a referral to GI to check on your liver.  I also sent in a referral to pulmonology so we can document your lung function. If you are going to make a disability claim, having the result of pulmonary function tests may help your case.  For your next appointment, please have fasting labs drawn 2-3 days prior to your appointment so we can discuss the results during your office visit.

## 2020-12-12 NOTE — Assessment & Plan Note (Signed)
-  hx of alcohol abuse; he cut back on his drinking after admitted -imaging showed questionable cirrhosis -referral to GI

## 2020-12-12 NOTE — Assessment & Plan Note (Signed)
Wt Readings from Last 3 Encounters:  12/12/20 158 lb (71.7 kg)  11/29/20 160 lb (72.6 kg)  11/28/20 158 lb (71.7 kg)  -wt is stable, and he has no leg edema today -he states cardiology will set him up for heart cath -checking labs this week; wasn't fasting today

## 2020-12-12 NOTE — Progress Notes (Signed)
Established Patient Office Visit  Subjective:  Patient ID: Jordan Kelly, male    DOB: June 02, 1961  Age: 60 y.o. MRN: [id number]  CC:  Chief Complaint  Patient presents with  . Annual Exam    HPI [name] presents for physical exam. He was recently seen by cardiology for pulmonary HTN, RV failure, PFO, and a-flutter. Records reviewed. He had DCCV while admitted for his a-flutter. Cardiology provided him with a $10 copay card for his eliquis.  He states that cardiology would like to perform a heart cath in the future.  He states that he has some breast tenderness.  Past Medical History:  Diagnosis Date  . Atrial flutter ([place])   . COPD (chronic obstructive pulmonary disease) ([place])   . Depression   . PFO (patent foramen ovale)     Past Surgical History:  Procedure Laterality Date  . BUBBLE STUDY  11/08/2020   Procedure: BUBBLE STUDY;  Surgeon: Jerline Pain, MD;  Location[facility] ENDOSCOPY;  Service: Cardiovascular;;  . CARDIOVERSION N/A 11/08/2020   Procedure: CARDIOVERSION;  Surgeon: Jerline Pain, MD;  Location: [facility] ENDOSCOPY;  Service: Cardiovascular;  Laterality: N/A;  . HERNIA REPAIR     right inguinal hernia  . TEE WITHOUT CARDIOVERSION N/A 11/08/2020   Procedure: TRANSESOPHAGEAL ECHOCARDIOGRAM (TEE);  Surgeon: Jerline Pain, MD;  Location: [facility] ENDOSCOPY;  Service: Cardiovascular;  Laterality: N/A;    History reviewed. No pertinent family history.  Social History   Socioeconomic History  . Marital status: Divorced    Spouse name: Not on file  . Number of children: Not on file  . Years of education: Not on file  . Highest education level: Not on file  Occupational History  . 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 Smoking status: Current Every Day Smoker    Packs/day: 0.25    Types: Cigarettes  . Smokeless tobacco: Never Used  Vaping Use  . Vaping Use: Never used  Substance and Sexual Activity  . Alcohol use: Yes    Alcohol/week:  14.0 standard drinks    Types: 14 Cans of beer per week    Comment: 2  . Drug use: Yes    Frequency: 5.0 times per week    Types: Marijuana    Comment: last smoked 11/03/20  . Sexual activity: Not Currently  Other Topics Concern  . Not on file  Social History Narrative  . Not on file   Social Determinants of Health   Financial Resource Strain: Not on file  Food Insecurity: No Food Insecurity  . Worried About Charity fundraiser in the Last Year: Never true  . Ran Out of Food in the Last Year: Never true  Transportation Needs: No Transportation Needs  . Lack of Transportation (Medical): No  . Lack of Transportation (Non-Medical): No  Physical Activity: Not on file  Stress: Not on file  Social Connections: Not on file  Intimate Partner Violence: Not on file    Outpatient Medications Prior to Visit  Medication Sig Dispense Refill  . albuterol (VENTOLIN HFA) 108 (90 Base) MCG/ACT inhaler Inhale 2 puffs into the lungs every 6 (six) hours as needed for wheezing or shortness of breath. Dx J44.1--- COPD 1 Inhaler 2  . apixaban (ELIQUIS) 5 MG TABS tablet Take 1 tablet (5 mg total) by mouth 2 (two) times daily. 120 tablet 2  . bisacodyl (DULCOLAX) 10 MG suppository Place 1 suppository (10 mg total)  rectally as needed for moderate constipation. 12 suppository 0  . bisoprolol (ZEBETA) 5 MG tablet Take 1 tablet (5 mg total) by mouth daily. 90 tablet 2  . Multiple Vitamin (MULTIVITAMIN WITH MINERALS) TABS tablet Take 1 tablet by mouth daily. 30 tablet 3  . nicotine (NICODERM CQ - DOSED IN MG/24 HOURS) 21 mg/24hr patch Place 1 patch (21 mg total) onto the skin daily. 28 patch 0  . ondansetron (ZOFRAN-ODT) 4 MG disintegrating tablet Take 1 tablet (4 mg total) by mouth every 8 (eight) hours as needed for nausea or vomiting. 20 tablet 0  . sodium phosphate (FLEET) 7-19 GM/118ML ENEM Place 133 mLs (1 enema total) rectally daily as needed for severe constipation. 133 mL 0  . spironolactone  (ALDACTONE) 25 MG tablet Take 1/2 tablet (12.5 mg total) by mouth daily. 90 tablet 2  . torsemide (DEMADEX) 20 MG tablet Take 1 tablet (20 mg total) by mouth daily. 90 tablet 2  . triamcinolone cream (KENALOG) 0.5 % Apply topically 2 (two) times daily. 30 g 0   No facility-administered medications prior to visit.    No Known Allergies  ROS Review of Systems  Constitutional: Negative.   HENT: Negative.   Eyes: Negative.   Respiratory: Positive for shortness of breath.        With exertion is his new baseline  Cardiovascular: Negative.        Leg swelling has improved  Gastrointestinal: Negative.   Endocrine: Negative.   Genitourinary: Negative.   Musculoskeletal: Negative.   Skin: Negative.   Allergic/Immunologic: Negative.   Neurological: Negative.   Hematological: Negative.   Psychiatric/Behavioral: Negative.       Objective:    Physical Exam Constitutional:      Appearance: Normal appearance.  HENT:     Head: Normocephalic and atraumatic.     Right Ear: Tympanic membrane, ear canal and external ear normal.     Left Ear: External ear normal. There is impacted cerumen.     Nose: Nose normal.     Mouth/Throat:     Mouth: Mucous membranes are moist.     Pharynx: Oropharynx is clear.  Eyes:     Extraocular Movements: Extraocular movements intact.     Conjunctiva/sclera: Conjunctivae normal.     Pupils: Pupils are equal, round, and reactive to light.  Cardiovascular:     Rate and Rhythm: Normal rate and regular rhythm.     Pulses: Normal pulses.     Heart sounds: Normal heart sounds.     Comments: Has hx of a-flutter, but regular rhythm heard on exam today Pulmonary:     Effort: Pulmonary effort is normal.     Breath sounds: Normal breath sounds.  Abdominal:     General: Abdomen is flat. Bowel sounds are normal.     Palpations: Abdomen is soft.  Musculoskeletal:        General: Normal range of motion.     Cervical back: Normal range of motion and neck supple.   Skin:    General: Skin is warm and dry.     Capillary Refill: Capillary refill takes less than 2 seconds.  Neurological:     General: No focal deficit present.     Mental Status: He is alert and oriented to person, place, and time.  Psychiatric:        Mood and Affect: Mood normal.        Behavior: Behavior normal.        Thought Content: Thought content normal.  Judgment: Judgment normal.     BP 104/72   Pulse 73   Temp 98.2 F (36.8 C)   Resp 20   Ht 5' 10.5" (1.791 m)   Wt 158 lb (71.7 kg)   SpO2 93%   BMI 22.35 kg/m  Wt Readings from Last 3 Encounters:  12/12/20 158 lb (71.7 kg)  11/29/20 160 lb (72.6 kg)  11/28/20 158 lb (71.7 kg)     There are no preventive care reminders to display for this patient.  There are no preventive care reminders to display for this patient.  Lab Results  Component Value Date   TSH 1.843 11/05/2020   Lab Results  Component Value Date   WBC 9.7 11/14/2020   HGB 16.2 11/14/2020   HCT 49.6 11/14/2020   MCV 97.4 11/14/2020   PLT 273 11/14/2020   Lab Results  Component Value Date   NA 136 11/28/2020   K 5.2 11/28/2020   CO2 25 11/28/2020   GLUCOSE 94 11/28/2020   BUN 23 11/28/2020   CREATININE 0.81 11/28/2020   BILITOT 0.8 11/28/2020   ALKPHOS 165 (H) 11/28/2020   AST 25 11/28/2020   ALT 29 11/28/2020   PROT 8.7 (H) 11/28/2020   ALBUMIN 4.8 11/28/2020   CALCIUM 10.7 (H) 11/28/2020   ANIONGAP 10 11/14/2020   EGFR 103 11/28/2020   No results found for: CHOL No results found for: HDL No results found for: LDLCALC No results found for: TRIG No results found for: CHOLHDL No results found for: HGBA1C    Assessment & Plan:   Problem List Items Addressed This Visit      Cardiovascular and Mediastinum   CHF (congestive heart failure) ([place]) - Primary    Wt Readings from Last 3 Encounters:  12/12/20 158 lb (71.7 kg)  11/29/20 160 lb (72.6 kg)  11/28/20 158 lb (71.7 kg)  -wt is stable, and he has no leg edema  today -he states cardiology will set him up for heart cath -checking labs this week; wasn't fasting today         Respiratory   Emphysema/COPD ([place])    -diagnosed on CT from April 2022 admission -has PRN albuterol -will refer to pulmonology for PFTs        Other   Abnormal liver diagnostic imaging    -hx of alcohol abuse; he cut back on his drinking after admitted -imaging showed questionable cirrhosis -referral to GI       Relevant Orders   Ambulatory referral to Gastroenterology    Other Visit Diagnoses    General medical exam       Relevant Orders   CBC with Differential/Platelet   CMP14+EGFR   Lipid Panel With LDL/HDL Ratio      No orders of the defined types were placed in this encounter.   Follow-up: Return in about 3 months (around 03/14/2021) for Follow-up (CHF, COPD, LFTs).    Noreene Larsson, NP

## 2020-12-12 NOTE — Assessment & Plan Note (Signed)
-  diagnosed on CT from April 2022 admission -has PRN albuterol -will refer to pulmonology for PFTs

## 2020-12-12 NOTE — Assessment & Plan Note (Signed)
-  recently starte don diuretics -has some breast tenderness; we discussed gynecomastia and spironolactone -he will contact cardiology to see if they want to change diuretics; no change today since cardiology is managing this

## 2020-12-13 ENCOUNTER — Encounter: Payer: Self-pay | Admitting: Internal Medicine

## 2020-12-20 LAB — CBC WITH DIFFERENTIAL/PLATELET
Basophils Absolute: 0 10*3/uL (ref 0.0–0.2)
Basos: 0 %
EOS (ABSOLUTE): 0.1 10*3/uL (ref 0.0–0.4)
Eos: 1 %
Hematocrit: 46.7 % (ref 37.5–51.0)
Hemoglobin: 16.2 g/dL (ref 13.0–17.7)
Immature Grans (Abs): 0 10*3/uL (ref 0.0–0.1)
Immature Granulocytes: 0 %
Lymphocytes Absolute: 2.4 10*3/uL (ref 0.7–3.1)
Lymphs: 26 %
MCH: 32.5 pg (ref 26.6–33.0)
MCHC: 34.7 g/dL (ref 31.5–35.7)
MCV: 94 fL (ref 79–97)
Monocytes Absolute: 0.8 10*3/uL (ref 0.1–0.9)
Monocytes: 9 %
Neutrophils Absolute: 6 10*3/uL (ref 1.4–7.0)
Neutrophils: 64 %
Platelets: 411 10*3/uL (ref 150–450)
RBC: 4.99 x10E6/uL (ref 4.14–5.80)
RDW: 14.9 % (ref 11.6–15.4)
WBC: 9.4 10*3/uL (ref 3.4–10.8)

## 2020-12-20 LAB — CMP14+EGFR
ALT: 18 IU/L (ref 0–44)
AST: 19 IU/L (ref 0–40)
Albumin/Globulin Ratio: 1.2 (ref 1.2–2.2)
Albumin: 4.3 g/dL (ref 3.8–4.9)
Alkaline Phosphatase: 152 IU/L — ABNORMAL HIGH (ref 44–121)
BUN/Creatinine Ratio: 12 (ref 9–20)
BUN: 9 mg/dL (ref 6–24)
Bilirubin Total: 0.5 mg/dL (ref 0.0–1.2)
CO2: 29 mmol/L (ref 20–29)
Calcium: 9.5 mg/dL (ref 8.7–10.2)
Chloride: 94 mmol/L — ABNORMAL LOW (ref 96–106)
Creatinine, Ser: 0.76 mg/dL (ref 0.76–1.27)
Globulin, Total: 3.5 g/dL (ref 1.5–4.5)
Glucose: 107 mg/dL — ABNORMAL HIGH (ref 65–99)
Potassium: 4.2 mmol/L (ref 3.5–5.2)
Sodium: 138 mmol/L (ref 134–144)
Total Protein: 7.8 g/dL (ref 6.0–8.5)
eGFR: 105 mL/min/{1.73_m2} (ref >59)

## 2020-12-20 LAB — LIPID PANEL WITH LDL/HDL RATIO
Cholesterol, Total: 175 mg/dL (ref 100–199)
HDL: 69 mg/dL (ref >39)
LDL Chol Calc (NIH): 93 mg/dL (ref 0–99)
LDL/HDL Ratio: 1.3 ratio (ref 0.0–3.6)
Triglycerides: 68 mg/dL (ref 0–149)
VLDL Cholesterol Cal: 13 mg/dL (ref 5–40)

## 2020-12-20 NOTE — Progress Notes (Signed)
Labs good good except for blood sugar, it was slightly elevated. I added an A1c to screen for diabetes.

## 2020-12-22 LAB — HGB A1C W/O EAG: Hgb A1c MFr Bld: 6.3 % — ABNORMAL HIGH (ref 4.8–5.6)

## 2020-12-22 LAB — SPECIMEN STATUS REPORT

## 2020-12-22 NOTE — Progress Notes (Signed)
A1c was 6.3 which is in the prediabetes range. No need to add or change medicines, but cutting back on soda and sugary foods will help and increasing exercise will reduce that A1c.

## 2020-12-26 ENCOUNTER — Other Ambulatory Visit (HOSPITAL_COMMUNITY): Payer: Self-pay

## 2020-12-26 ENCOUNTER — Telehealth: Payer: Self-pay | Admitting: Student

## 2020-12-26 NOTE — Telephone Encounter (Signed)
Pt called stating that he was supposed to call and set up a cath   Please call [phone]

## 2020-12-26 NOTE — Telephone Encounter (Signed)
New message    Patient daughter called on patient AVS it said to call after May 5th to schedule heart cath, she needs a call back today to schedule this procedure for her dad

## 2020-12-27 NOTE — Telephone Encounter (Signed)
Please give pt's daughter, Hoyle Barkdull a call @ [phone]  She would like to get him scheduled for the Cath ASAP.

## 2020-12-27 NOTE — Telephone Encounter (Signed)
Pt daughter was notified that the Cath Lab is pushing all non-urgent cath's back towards June 20th. Will call cath lab and schedule pts cath for after that time. Daughter verbalized that any day and time would work.

## 2021-01-04 ENCOUNTER — Telehealth: Payer: Self-pay | Admitting: *Deleted

## 2021-01-04 ENCOUNTER — Telehealth: Payer: 59

## 2021-01-04 NOTE — Telephone Encounter (Signed)
  Care Management   Follow Up Note   01/04/2021 Name: Jordan Kelly MRN: [id number] DOB: 06-16-1961   Referred by: Noreene Larsson, NP Reason for referral : Chronic Care Management (CHF, Cirrhosis (non- billable))   An unsuccessful telephone outreach was attempted today. The patient was referred to the case management team for assistance with care management and care coordination.   Follow Up Plan: Telephone follow up appointment with care management team member scheduled for:  Careguide will reschedule.  Jacqlyn Larsen [facility], BSN RN Case Manager [place] [phone]

## 2021-01-09 ENCOUNTER — Ambulatory Visit: Payer: 59 | Admitting: *Deleted

## 2021-01-09 DIAGNOSIS — K7030 Alcoholic cirrhosis of liver without ascites: Secondary | ICD-10-CM

## 2021-01-09 DIAGNOSIS — I50813 Acute on chronic right heart failure: Secondary | ICD-10-CM

## 2021-01-09 NOTE — Chronic Care Management (AMB) (Signed)
Care Management    RN Visit Note  01/09/2021 Name: Jordan Kelly MRN: [id number] DOB: 07-16-1961  Subjective: Jordan Kelly is a 60 y.o. year old male who is a primary care patient of Noreene Larsson, NP. The care management team was consulted for assistance with disease management and care coordination needs.    Engaged with patient by telephone for follow up visit in response to provider referral for case management and/or care coordination services.   Consent to Services:   Mr. Huskins was given information about Care Management services today including:  1. Care Management services includes personalized support from designated clinical staff supervised by his physician, including individualized plan of care and coordination with other care providers 2. 24/7 contact phone numbers for assistance for urgent and routine care needs. 3. The patient may stop case management services at any time by phone call to the office staff.  Patient agreed to services and consent obtained.   Assessment: Review of patient past medical history, allergies, medications, health status, including review of consultants reports, laboratory and other test data, was performed as part of comprehensive evaluation and provision of chronic care management services.   SDOH (Social Determinants of Health) assessments and interventions performed:    Care Plan  No Known Allergies  Outpatient Encounter Medications as of 01/09/2021  Medication Sig  . albuterol (VENTOLIN HFA) 108 (90 Base) MCG/ACT inhaler Inhale 2 puffs into the lungs every 6 (six) hours as needed for wheezing or shortness of breath. Dx J44.1--- COPD  . apixaban (ELIQUIS) 5 MG TABS tablet Take 1 tablet (5 mg total) by mouth 2 (two) times daily.  . bisacodyl (DULCOLAX) 10 MG suppository Place 1 suppository (10 mg total) rectally as needed for moderate constipation.  . bisoprolol (ZEBETA) 5 MG tablet Take 1 tablet (5 mg total) by mouth daily.  . Multiple  Vitamin (MULTIVITAMIN WITH MINERALS) TABS tablet Take 1 tablet by mouth daily.  . nicotine (NICODERM CQ - DOSED IN MG/24 HOURS) 21 mg/24hr patch Place 1 patch (21 mg total) onto the skin daily.  . sodium phosphate (FLEET) 7-19 GM/118ML ENEM Place 133 mLs (1 enema total) rectally daily as needed for severe constipation.  Marland Kitchen spironolactone (ALDACTONE) 25 MG tablet Take 1/2 tablet (12.5 mg total) by mouth daily.  Marland Kitchen torsemide (DEMADEX) 20 MG tablet Take 1 tablet (20 mg total) by mouth daily.  Marland Kitchen triamcinolone cream (KENALOG) 0.5 % Apply topically 2 (two) times daily.  . ondansetron (ZOFRAN-ODT) 4 MG disintegrating tablet Take 1 tablet (4 mg total) by mouth every 8 (eight) hours as needed for nausea or vomiting. (Patient not taking: Reported on 01/09/2021)   No facility-administered encounter medications on file as of 01/09/2021.    Patient Active Problem List   Diagnosis Date Noted  . Abnormal liver diagnostic imaging 12/12/2020  . Pulmonary hypertension, primary ([place]) 11/28/2020  . PFO (patent foramen ovale) 11/14/2020  . Cirrhosis ([place]) 11/14/2020  . Psoriasis 11/14/2020  . AKI (acute kidney injury) ([place]) 11/14/2020  . Acute cor pulmonale ([place]) 11/13/2020  . CHF (congestive heart failure) ([place]) 11/05/2020  . Congestive heart failure (CHF) ([place]) 11/04/2020  . Atrial flutter with rapid ventricular response ([place]) 11/04/2020  . Encounter to establish care 10/30/2020  . Emphysema/COPD ([place]) 10/30/2020  . Leg swelling 10/30/2020  . Constipation 10/30/2020  . Nausea 10/30/2020  . Tobacco abuse 01/09/2019  . Alcohol abuse 01/09/2019    Conditions to be addressed/monitored: CHF, Cirrhosis , Care Plan : Heart Failure (Adult)  Updates made by Jordan Mends, RN since 01/09/2021 12:00 AM    Problem: Symptom Exacerbation (Heart Failure)     Long-Range Goal: Symptom Exacerbation Prevented or Minimized   Start Date: 11/30/2020  Expected End Date: 06/01/2021  This Visit's Progress: On track   Recent Progress: On track  Priority: High  Note:   Current Barriers:  Marland Kitchen Knowledge deficit related to basic heart failure pathophysiology and self care management- needs reinforcement of CHF action plan . Financial strain- patient is back at work 30 hours per week at [employer], this is all he is able to do at present due to health issues, pt hopes to increase his hours in the future. Case Manager Clinical Goal(s):  . patient will weigh self daily and record . patient will verbalize understanding of Heart Failure Action Plan and when to call doctor . patient will take all Heart Failure mediations as prescribed . patient will weigh daily and record (notifying MD of 3 lb weight gain over night or 5 lb in a week) Interventions:  . Collaboration with Noreene Larsson, NP regarding development and update of comprehensive plan of care as evidenced by provider attestation and co-signature . Inter-disciplinary care team collaboration (see longitudinal plan of care) . Basic overview and discussion of pathophysiology of Heart Failure reviewed  . Reinforced low sodium diet . Reinforced importance of daily weight and advised patient to weigh and record daily . Reviewed role of diuretics in prevention of fluid overload and management of heart failure and importance of taking as prescribed . Reviewed importance of calling doctor for exacerbation of HF symptoms such as weight gain, edema, etc. . Ask patient to call RN CM or social worker for any continued financial issues such as medication affordability . Reviewed with patient contact number for Eliquis Savings Card [phone] - pt has the card and has used it . Encouraged walking outside daily and practicing energy conservation . Reviewed all upcoming appointments- 6/13 Dr. Halford Chessman,  [address]  6/30 Hurshel Keys and 8/2 Demetrius Revel. Self-Care Activities: . Self administers medications as prescribed . Attends all scheduled  provider appointments . Attends church or other social activities . Calls provider office for new concerns or questions Patient Goals: - develop a rescue plan - eat more whole grains, fruits and vegetables, lean meats and healthy fats - follow rescue plan if symptoms flare-up - know when to call the doctor, call early for change in health status/ symptoms - call doctor if have 3 pound weight gain in one day or 5 pounds in one week - follow up with cardiologist on 01/23/21 - call if you have any difficulty affording medications Follow Up Plan: Telephone follow up appointment with care management team member scheduled for: 03/06/2021   Care Plan : Cirrhosis  Updates made by Jordan Mends, RN since 01/09/2021 12:00 AM    Problem: Health Promotion or Disease Self-Management of Cirrhosis   Priority: High  Note:     Long-Range Goal: Self-Management Plan Developed   Start Date: 11/30/2020  Expected End Date: 06/01/2021  This Visit's Progress: On track  Priority: High  Note:   Current Barriers:   Ineffective Self Health Maintenance - continues to smoke 5 cigarettes daily, is using nicoderm patch, trying to completely quit.  Does not contact provider office for questions/concerns- does not always call MD office for questions/ concerns. Clinical Goal(s):  Marland Kitchen Collaboration with Noreene Larsson, NP regarding development and update of comprehensive plan of care  as evidenced by provider attestation and co-signature . Inter-disciplinary care team collaboration (see longitudinal plan of care)  patient will work with care management team to address care coordination and chronic disease management needs related to Educational Needs  Care Coordination  Medication Management and Education  Medication Assistance - pt now has eliquis card and is able to afford with 10$ copay   Interventions:   Evaluation of current treatment plan related to CHF, Cirrhosis Financial constraints related to working  part time at 30 hours per week, self-management and patient's adherence to plan as established by provider.  Collaboration with Noreene Larsson, NP regarding development and update of comprehensive plan of care as evidenced by provider attestation       and co-signature  Inter-disciplinary care team collaboration (see longitudinal plan of care)  Discussed plans with patient for ongoing care management follow up and provided patient with direct contact information for care management team  Encouraged pt to call doctor for change in health status/ symptoms early on  Reviewed all upcoming appointments with patient  Continue working with Education officer, museum as needed and encouraged pt to complete advanced directives Self Care Activities:  . Attends all scheduled provider appointments . Calls pharmacy for medication refills . Calls provider office for new concerns or questions Patient Goals: - read over and complete living will/ health care power of attorney which has been mailed to you - discuss treatment options with the doctor or nurse - make shared treatment decisions with doctor - continue spending time outdoors at least 3 times a week - continue walking and lifting light weights - take medications as prescribed, call if any further difficulty obtaining eliquis - congratulations on being back at work, do what you can and alternate activity with rest Follow Up Plan: Telephone outreach with care manager planned for - 02/27/2021      Plan: Telephone follow up appointment with care management team member scheduled for:  03/06/2021  Jordan Kelly [facility], BSN RN Case Manager [facility] [phone]

## 2021-01-09 NOTE — Patient Instructions (Signed)
Visit Information  Goals Addressed            This Visit's Progress   . Maintain My Quality of Life related to managment of cirrhosis       Timeframe:  Long-Range Goal Priority:  High Start Date:      01/04/2021                       Expected End Date:     06/06/2021                  Follow Up Date 02/27/2021   - read over and complete living will/ health care power of attorney which has been mailed to you - discuss treatment options with the doctor or nurse - make shared treatment decisions with doctor - continue spending time outdoors at least 3 times a week - continue walking and lifting light weights - take medications as prescribed, call if any further difficulty obtaining eliquis - congratulations on being back at work, do what you can and alternate activity with rest   Why is this important?    Having a long-term illness can be scary.   It can also be stressful for you and your caregiver.   These steps may help.    Notes:     . Track and Manage Symptoms-Heart Failure       Timeframe:  Long-Range Goal Priority:  High Start Date:         11/30/2020                    Expected End Date:    06/01/2021                   Follow Up Date 03/06/2021   - develop a rescue plan - eat more whole grains, fruits and vegetables, lean meats and healthy fats - follow rescue plan if symptoms flare-up - know when to call the doctor, call early for change in health status/ symptoms - call doctor if have 3 pound weight gain in one day or 5 pounds in one week - follow up with cardiologist on 01/23/21 - call if you have any difficulty affording medications    Why is this important?    You will be able to handle your symptoms better if you keep track of them.   Making some simple changes to your lifestyle will help.   Eating healthy is one thing you can do to take good care of yourself.    Notes:        The patient verbalized understanding of instructions, educational  materials, and care plan provided today and declined offer to receive copy of patient instructions, educational materials, and care plan.   Telephone follow up appointment with care management team member scheduled for: 03/06/2021  Jacqlyn Larsen [facility], BSN RN Case Manager [facility] [phone]

## 2021-01-22 ENCOUNTER — Other Ambulatory Visit: Payer: Self-pay

## 2021-01-22 ENCOUNTER — Ambulatory Visit (INDEPENDENT_AMBULATORY_CARE_PROVIDER_SITE_OTHER): Payer: 59 | Admitting: Pulmonary Disease

## 2021-01-22 ENCOUNTER — Encounter: Payer: Self-pay | Admitting: Pulmonary Disease

## 2021-01-22 ENCOUNTER — Telehealth: Payer: Self-pay

## 2021-01-22 ENCOUNTER — Ambulatory Visit (HOSPITAL_COMMUNITY)
Admission: RE | Admit: 2021-01-22 | Discharge: 2021-01-22 | Disposition: A | Discharge status: Home / Self Care | Payer: 59 | Source: Ambulatory Visit | Attending: Pulmonary Disease | Admitting: Pulmonary Disease

## 2021-01-22 VITALS — BP 110/68 | HR 70 | Temp 98.1°F | Ht 70.0 in | Wt 156.0 lb

## 2021-01-22 DIAGNOSIS — R0683 Snoring: Secondary | ICD-10-CM | POA: Diagnosis not present

## 2021-01-22 DIAGNOSIS — R059 Cough, unspecified: Secondary | ICD-10-CM | POA: Diagnosis present

## 2021-01-22 DIAGNOSIS — L409 Psoriasis, unspecified: Secondary | ICD-10-CM

## 2021-01-22 MED ORDER — FLUTICASONE-SALMETEROL 250-50 MCG/ACT IN AEPB: 1.0000 | INHALATION_SPRAY | Freq: Two times a day (BID) | RESPIRATORY_TRACT | 5 refills | Status: DC

## 2021-01-22 MED ORDER — ALBUTEROL SULFATE HFA 108 (90 BASE) MCG/ACT IN AERS: 2.0000 | INHALATION_SPRAY | Freq: Four times a day (QID) | RESPIRATORY_TRACT | 5 refills | Status: DC | PRN

## 2021-01-22 MED ORDER — TRIAMCINOLONE ACETONIDE 0.5 % EX CREA: TOPICAL_CREAM | Freq: Two times a day (BID) | CUTANEOUS | 0 refills | Status: DC

## 2021-01-22 NOTE — Telephone Encounter (Signed)
Rx refilled.

## 2021-01-22 NOTE — Telephone Encounter (Signed)
Patient need med refill no longer gets from Dr Glenford Peers.  triamcinolone cream (KENALOG) 0.5 %   Pharmacy: [facility]

## 2021-01-22 NOTE — Progress Notes (Signed)
[facility]  Chief Complaint  Patient presents with   Consult    Non productive cough     Constitutional:  BP 110/68 (BP Location: Left Arm, Cuff Size: Normal)   Pulse 70   Temp 98.1 F (36.7 C) (Temporal)   Ht 5\' 10"  (1.778 m)   Wt 156 lb (70.8 kg)   SpO2 95% Comment: Room air  BMI 22.38 kg/m   Past Medical History:  A flutter, Depression, PFO, CHF, Psoriasis, ETOH with cirrhosis  Past Surgical History:  He  has a past surgical history that includes Hernia repair; TEE without cardioversion (N/A, 11/08/2020); Cardioversion (N/A, 11/08/2020); and Bubble study (11/08/2020).  Brief Summary:  Jordan Kelly is a 60 y.o. male smoker with cough.       Subjective:   He is from [place] and has lived in this area his whole life.  He was smoking 2 packs per day, but down to 1/4 pack per day.  Using nicotine patch.  Works as a Training and development officer.  No animal/bird exposures.  No history of TB.  No travel history.  He was never in the [employer]  His father had emphysema.  He was in hospital in April with A flutter and heart failure.  Has history of ETOH and was told he has cirrhosis.  He has been feeling more fatigued.  He snores and wakes up short of breath.  He gets sinus congestion, particularly in the morning.  He has cough with clear sputum.  Intermittently gets wheezing.  Uses albuterol intermittently and this helps.  He was using spiriva which also helped, but was too expensive.  He notices his cough and wheeze are worse in Spring and Fall and also with humid weather.  Physical Exam:   Appearance - well kempt   ENMT - no sinus tenderness, no oral exudate, no LAN, Mallampati 3 airway, no stridor  Respiratory - decreased breath sounds bilaterally, no wheezing or rales  CV - s1s2 regular rate and rhythm, no murmurs  Ext - no clubbing, no edema  Skin - no rashes  Psych - normal mood and affect   Pulmonary testing:    Chest Imaging:  CT angio  chest 11/13/20 >>mod Rt effusion, small Lt effusion, mild emphysema, remote rib fractures  Sleep Tests:    Cardiac Tests:  Echo 11/05/20 >> EF 45 to 50%, mod RV dysfunction, severe RA dilation  Social History:  He  reports that he has been smoking cigarettes. He has been smoking an average of 0.25 packs per day. He has never used smokeless tobacco. He reports current alcohol use of about 14.0 standard drinks of alcohol per week. He reports current drug use. Frequency: 5.00 times per week. Drug: Marijuana.  Family History:  His family history includes COPD in his father.     Assessment/Plan:   Cough with emphysema and history of tobacco abuse. - he might have component of asthma and allergies also - defer PFT until cardiac assessment is completed and heart function stable - will have him try advair with prn albuterol; spiriva was too expensive  Right pleural effusion. - seen on CT chest from April 2022 - will repeat chest xray  Tobacco abuse. - encouraged him to keep up smoking cessation efforts - he is using nicotine patch  Snoring. - associated with apnea, sleep disruption, and daytime sleepiness - he has history of heart disease and depression - he could have obstructive sleep apnea - will arrange for  home sleep study to assess further  A flutter, chronic systolic CHF, PFO. - followed by [facility]  ETOH with cirrhosis. - he has appointment scheduled with Dr. Hurshel Keys with Gastroenterology  Time Spent Involved in Patient Care on Day of Examination:  48 minutes  Follow up:   Patient Instructions  Will arrange for chest xray and home sleep study  Advair one puff in the morning and one puff in the evening, and rinse your mouth after each use  Albuterol two puffs every 6 hours as needed for cough, wheeze, or chest congestion  Follow up in 8 weeks  Medication List:   Allergies as of 01/22/2021   No Known Allergies      Medication List         Accurate as of January 22, 2021 11:00 AM. If you have any questions, ask your nurse or doctor.          albuterol 108 (90 Base) MCG/ACT inhaler Commonly known as: VENTOLIN HFA Inhale 2 puffs into the lungs every 6 (six) hours as needed for wheezing or shortness of breath. What changed: additional instructions Changed by: Chesley Mires, MD   apixaban 5 MG Tabs tablet Commonly known as: ELIQUIS Take 1 tablet (5 mg total) by mouth 2 (two) times daily.   bisacodyl 10 MG suppository Commonly known as: DULCOLAX Place 1 suppository (10 mg total) rectally as needed for moderate constipation.   bisoprolol 5 MG tablet Commonly known as: ZEBETA Take 1 tablet (5 mg total) by mouth daily.   fluticasone-salmeterol 250-50 MCG/ACT Aepb Commonly known as: Advair Diskus Inhale 1 puff into the lungs in the morning and at bedtime. Started by: Chesley Mires, MD   guaiFENesin 600 MG 12 hr tablet Commonly known as: MUCINEX Take 600 mg by mouth 2 (two) times daily as needed.   multivitamin with minerals Tabs tablet Take 1 tablet by mouth daily.   nicotine 21 mg/24hr patch Commonly known as: NICODERM CQ - dosed in mg/24 hours Place 1 patch (21 mg total) onto the skin daily.   ondansetron 4 MG disintegrating tablet Commonly known as: ZOFRAN-ODT Take 1 tablet (4 mg total) by mouth every 8 (eight) hours as needed for nausea or vomiting.   sodium phosphate 7-19 GM/118ML Enem Place 133 mLs (1 enema total) rectally daily as needed for severe constipation.   spironolactone 25 MG tablet Commonly known as: ALDACTONE Take 1/2 tablet (12.5 mg total) by mouth daily.   torsemide 20 MG tablet Commonly known as: DEMADEX Take 1 tablet (20 mg total) by mouth daily.   triamcinolone cream 0.5 % Commonly known as: KENALOG Apply topically 2 (two) times daily.        Signature:  Chesley Mires, MD [place] Pager - [phone] 01/22/2021, 11:00 AM

## 2021-01-22 NOTE — Patient Instructions (Signed)
Will arrange for chest xray and home sleep study  Advair one puff in the morning and one puff in the evening, and rinse your mouth after each use  Albuterol two puffs every 6 hours as needed for cough, wheeze, or chest congestion  Follow up in 8 weeks

## 2021-01-23 ENCOUNTER — Encounter: Payer: Self-pay | Admitting: Student

## 2021-01-23 ENCOUNTER — Ambulatory Visit (INDEPENDENT_AMBULATORY_CARE_PROVIDER_SITE_OTHER): Payer: 59 | Admitting: Student

## 2021-01-23 ENCOUNTER — Other Ambulatory Visit (HOSPITAL_BASED_OUTPATIENT_CLINIC_OR_DEPARTMENT_OTHER): Payer: Self-pay

## 2021-01-23 VITALS — BP 110/68 | HR 75 | Ht 70.0 in | Wt 158.8 lb

## 2021-01-23 DIAGNOSIS — I483 Typical atrial flutter: Secondary | ICD-10-CM

## 2021-01-23 DIAGNOSIS — R079 Chest pain, unspecified: Secondary | ICD-10-CM

## 2021-01-23 DIAGNOSIS — Z01818 Encounter for other preprocedural examination: Secondary | ICD-10-CM

## 2021-01-23 DIAGNOSIS — I272 Pulmonary hypertension, unspecified: Secondary | ICD-10-CM

## 2021-01-23 DIAGNOSIS — Q211 Atrial septal defect: Secondary | ICD-10-CM

## 2021-01-23 DIAGNOSIS — F1010 Alcohol abuse, uncomplicated: Secondary | ICD-10-CM

## 2021-01-23 DIAGNOSIS — Q2112 Patent foramen ovale: Secondary | ICD-10-CM

## 2021-01-23 LAB — CBC
HCT: 46.8 % (ref 38.5–50.0)
Hemoglobin: 15.8 g/dL (ref 13.2–17.1)
MCH: 33.9 pg — ABNORMAL HIGH (ref 27.0–33.0)
MCHC: 33.8 g/dL (ref 32.0–36.0)
MCV: 100.4 fL — ABNORMAL HIGH (ref 80.0–100.0)
MPV: 8.9 fL (ref 7.5–12.5)
Platelets: 336 10*3/uL (ref 140–400)
RBC: 4.66 10*6/uL (ref 4.20–5.80)
RDW: 18.3 % — ABNORMAL HIGH (ref 11.0–15.0)
WBC: 10.1 10*3/uL (ref 3.8–10.8)

## 2021-01-23 LAB — BASIC METABOLIC PANEL
BUN/Creatinine Ratio: 14 (calc) (ref 6–22)
BUN: 9 mg/dL (ref 7–25)
CO2: 32 mmol/L (ref 20–32)
Calcium: 9.6 mg/dL (ref 8.6–10.3)
Chloride: 95 mmol/L — ABNORMAL LOW (ref 98–110)
Creat: 0.63 mg/dL — ABNORMAL LOW (ref 0.70–1.33)
Glucose, Bld: 91 mg/dL (ref 65–99)
Potassium: 4.8 mmol/L (ref 3.5–5.3)
Sodium: 133 mmol/L — ABNORMAL LOW (ref 135–146)

## 2021-01-23 NOTE — H&P (View-Only) (Signed)
Cardiology Office Note    Date:  01/23/2021   ID:  SAJJAD Kelly, DOB 10-Oct-1960, MRN [id number]  PCP:  Jordan Larsson, NP  Cardiologist:  Evaluated by Dr. Gasper Kelly and Dr. Audie Kelly during admission but requests to follow-up in [facility] Complaint  Patient presents with   Follow-up    6 week visit    History of Present Illness:    Jordan Kelly is a 60 y.o. male with past medical history of COPD, tobacco use and alcohol use who presents to the office today for 6-week follow-up.   He was last examined by myself in 11/2020 for hospital follow-up duirng which he was found to have new-onset atrial flutter with RVR and required TEE/DCCV during admission. His TEE also showed a secundum ASD with predominantly right to left shunting across the atrial septum and diameter of his ASD was approximately 0.5 cm. Images were reviewed by Dr. Audie Kelly during admission and felt to be most consistent with a PFO and did not explain his decreased RV function. Was recommended to have a thorough work-up for pulmonary HTN as an outpatient. At the time of his visit, he reported significant improvement in his dyspnea since his hospitalization and denied any chest pain or palpitations. Had reduced his tobacco use to less than 5 cigarettes daily and reduced his alcohol use from a 6-pack daily to less than 3 beers since hospital discharge. Risks and benefits for [facility] were reviewed at the time of his visit and he was in agreement to proceed but wanted to call back and schedule but this was delayed given the contrast shortage. PFT's were also ordered but have not been obtained. He was evaluated by Pulmonology on 01/22/2021 and was recommended to defer PFT's until after his cardiac work-up. He was set-up for a home sleep study.   In talking with the patient today, he does report having occasional episodes of chest discomfort since his last visit which can occur at rest or with activity and resolve within a  minute. He reports his breathing has overall been stable and denies any specific orthopnea, PND or pitting edema. Reports occasional palpitations but no persistent symptoms. Reports good compliance with Eliquis and denies any melena, hematochezia or hematuria. He does wish to move forward with his cardiac catheterization as previously discussed at his prior office visit.   Past Medical History:  Diagnosis Date   Atrial flutter (HCC)    COPD (chronic obstructive pulmonary disease) (HCC)    Depression    PFO (patent foramen ovale)    Psoriasis     Past Surgical History:  Procedure Laterality Date   BUBBLE STUDY  11/08/2020   Procedure: BUBBLE STUDY;  Surgeon: Jerline Pain, MD;  Location: [facility];  Service: Cardiovascular;;   CARDIOVERSION N/A 11/08/2020   Procedure: CARDIOVERSION;  Surgeon: Jerline Pain, MD;  Location: [facility];  Service: Cardiovascular;  Laterality: N/A;   HERNIA REPAIR     right inguinal hernia   TEE WITHOUT CARDIOVERSION N/A 11/08/2020   Procedure: TRANSESOPHAGEAL ECHOCARDIOGRAM (TEE);  Surgeon: Jerline Pain, MD;  Location: [facility];  Service: Cardiovascular;  Laterality: N/A;    Current Medications: Outpatient Medications Prior to Visit  Medication Sig Dispense Refill   albuterol (VENTOLIN HFA) 108 (90 Base) MCG/ACT inhaler Inhale 2 puffs into the lungs every 6 (six) hours as needed for wheezing or shortness of breath. 8 g 5   apixaban (ELIQUIS) 5 MG TABS tablet Take 1  tablet (5 mg total) by mouth 2 (two) times daily. 120 tablet 2   bisacodyl (DULCOLAX) 10 MG suppository Place 1 suppository (10 mg total) rectally as needed for moderate constipation. 12 suppository 0   bisoprolol (ZEBETA) 5 MG tablet Take 1 tablet (5 mg total) by mouth daily. 90 tablet 2   fluticasone-salmeterol (ADVAIR DISKUS) 250-50 MCG/ACT AEPB Inhale 1 puff into the lungs in the morning and at bedtime. 60 each 5   guaiFENesin (MUCINEX) 600 MG 12 hr tablet Take 600 mg by mouth 2  (two) times daily as needed.     Multiple Vitamin (MULTIVITAMIN WITH MINERALS) TABS tablet Take 1 tablet by mouth daily. 30 tablet 3   nicotine (NICODERM CQ - DOSED IN MG/24 HOURS) 21 mg/24hr patch Place 1 patch (21 mg total) onto the skin daily. 28 patch 0   ondansetron (ZOFRAN-ODT) 4 MG disintegrating tablet Take 1 tablet (4 mg total) by mouth every 8 (eight) hours as needed for nausea or vomiting. 20 tablet 0   sodium phosphate (FLEET) 7-19 GM/118ML ENEM Place 133 mLs (1 enema total) rectally daily as needed for severe constipation. 133 mL 0   spironolactone (ALDACTONE) 25 MG tablet Take 1/2 tablet (12.5 mg total) by mouth daily. 90 tablet 2   torsemide (DEMADEX) 20 MG tablet Take 1 tablet (20 mg total) by mouth daily. 90 tablet 2   triamcinolone cream (KENALOG) 0.5 % Apply topically 2 (two) times daily. 30 g 0   No facility-administered medications prior to visit.     Allergies:   Patient has no known allergies.   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  Tobacco Use   Smoking status: Every Day    Packs/day: 0.25    Pack years: 0.00    Types: Cigarettes   Smokeless tobacco: Never   Tobacco comments:    Smokes 5 cigarettes per day 01/22/21  Vaping Use   Vaping Use: Never used  Substance and Sexual Activity   Alcohol use: Yes    Alcohol/week: 14.0 standard drinks    Types: 14 Cans of beer per week    Comment: 2   Drug use: Yes    Frequency: 5.0 times per week    Types: Marijuana    Comment: last smoked 11/03/20   Sexual activity: Not Currently  Other Topics Concern   Not on file  Social History Narrative   Not on file   Social Determinants of Health   Financial Resource Strain: Not on file  Food Insecurity: No Food Insecurity    Worried About Estate manager/land agent of Food in the Last Year: Never true   Ran Out of Food in the Last Year: Never true  Transportation Needs: No Transportation Needs   Lack of Transportation (Medical): No   Lack of Transportation (Non-Medical): No  Physical Activity: Not on file  Stress: Not on file  Social Connections: Not on file     Family History:  The patient's family history includes COPD in his father.   Review of Systems:    Please see the history of present illness.     All other systems reviewed and are otherwise negative except as noted above.   Physical Exam:  VS:  BP 110/68   Pulse 75   Ht 5\' 10"  (1.778 m)   Wt 158 lb 12.8 oz (72 kg)   SpO2 97%   BMI 22.79 kg/m    General: Well developed, well nourished,male appearing in no acute distress. Head: Normocephalic, atraumatic. Neck: No carotid bruits. JVD not elevated.  Lungs: Respirations regular and unlabored, without wheezes or rales.  Heart: Regular rate and rhythm. No S3 or S4.  No murmur, no rubs, or gallops appreciated. Abdomen: Appears non-distended. No obvious abdominal masses. Msk:  Strength and tone appear normal for age. No obvious joint deformities or effusions. Extremities: No clubbing or cyanosis. No pitting edema.  Distal pedal pulses are 2+ bilaterally. Neuro: Alert and oriented X 3. Moves all extremities spontaneously. No focal deficits noted. Psych:  Responds to questions appropriately with a normal affect. Skin: No rashes or lesions noted  Wt Readings from Last 3 Encounters:  01/23/21 158 lb 12.8 oz (72 kg)  01/22/21 156 lb (70.8 kg)  12/12/20 158 lb (71.7 kg)     Studies/Labs Reviewed:   EKG:  EKG is not ordered today.   Recent Labs: 11/05/2020: TSH 1.843 11/08/2020: B Natriuretic Peptide 1,061.0 11/14/2020: Magnesium 1.8 12/19/2020: ALT 18; BUN 9; Creatinine, Ser 0.76; Hemoglobin 16.2; Platelets 411; Potassium 4.2; Sodium 138   Lipid Panel    Component Value Date/Time   CHOL 175 12/19/2020  1121   TRIG 68 12/19/2020 1121   HDL 69 12/19/2020 1121   LDLCALC 93 12/19/2020 1121    Additional studies/ records that were reviewed today include:   Echocardiogram: 10/2020 IMPRESSIONS     1. Left ventricular ejection fraction, by estimation, is 45 to 50%. The  left ventricle has mildly decreased function. The left ventricle  demonstrates global hypokinesis. Left ventricular diastolic function could  not be evaluated. There is the  interventricular septum is flattened in diastole ('D' shaped left  ventricle), consistent with right ventricular volume overload. The average  left ventricular global longitudinal strain is -12.0 %. The global  longitudinal strain is abnormal.   2. Right ventricular systolic function is moderately reduced. The right  ventricular size is moderately enlarged. There is mildly elevated  pulmonary artery systolic pressure.   3. Right atrial size was severely dilated.   4. The mitral valve is normal in structure. No evidence of mitral valve  regurgitation.   5. The aortic valve is tricuspid. Aortic valve regurgitation is not  visualized. No aortic stenosis is present.   6. The inferior vena cava is dilated in size with <50% respiratory  variability, suggesting right atrial pressure of 15 mmHg.   7. Evidence of atrial level shunting detected by color flow Doppler.  There is a small secundum atrial septal defect with bidirectional shunting  across the atrial septum.   Assessment:    1. Pulmonary hypertension ([place])   2. PFO (patent foramen ovale)   3. Chest pain of uncertain etiology   4. Typical atrial flutter ([place])   5. Pre-op testing   6. Alcohol abuse      Plan:   In order of problems listed above:  1. Pulmonary HTN/RV Failure/PFO - Was diagnosed furing his recent admission and his PFO was not felt to explain his RV failure with outpatient workup of his Pulmonary HTN recommended by Dr. Audie Kelly. His catheterization was delayed due to the  contrast shortage but is now able to be scheduled. Reviewed the procedure along with risks and benefits with the patient and he is  in agreement to proceed. Pending results, can proceed with high-resolution Chest CT and PFT's as previously recommended (also being followed by Pulmonology with plans for a home sleep study).  - He remains on Bisoprolol 5mg  daily, Spironolactone 12.5mg  daily and Torsemide 20mg  daily.   2. Chest Pain with Atypical Features - He does report intermittent chest pain which can occur at rest or with activity. Is atypical as symptoms typically resolve within several seconds. Plan for [facility] as outlined above.   3. Typical Atrial Flutter - He is s/p TEE/DCCV in 10/2020. Was in NSR at his last visit and is in NSR by examination today. Continue Bisoprolol 5mg  daily and Eliquis 5mg  BID. He can now hold Eliquis for 48 hours prior to his catheterization given he is more than 1 month out from [place].   4. Alcohol Abuse/Concern for Cirrhosis - Labs last month showed his AST was at 19 and ALT at 18. He has reduced his alcohol use significantly and was congratulated on this. Has been referred to GI by his PCP. He remains on Spironolactone.    Shared Decision Making/Informed Consent:   Shared Decision Making/Informed Consent The risks [stroke (1 in 1000), death (1 in 1000), kidney failure [usually temporary] (1 in 500), bleeding (1 in 200), allergic reaction [possibly serious] (1 in 200)], benefits (diagnostic support and management of coronary artery disease) and alternatives of a cardiac catheterization were discussed in detail with Mr. Brau and he is willing to proceed.    Medication Adjustments/Labs and Tests Ordered: Current medicines are reviewed at length with the patient today.  Concerns regarding medicines are outlined above.  Medication changes, Labs and Tests ordered today are listed in the Patient Instructions below. Patient Instructions  Medication Instructions:  Your  physician recommends that you continue on your current medications as directed. Please refer to the Current Medication list given to you today.   *If you need a refill on your cardiac medications before your next appointment, please call your pharmacy*   Lab Work: Your physician recommends that you return for lab work in: Today   If you have labs (blood work) drawn today and your tests are completely normal, you will receive your results only by: MyChart Message (if you have MyChart) OR A paper copy in the mail If you have any lab test that is abnormal or we need to change your treatment, we will call you to review the results.   Testing/Procedures: Your physician has requested that you have a cardiac catheterization. Cardiac catheterization is used to diagnose and/or treat various heart conditions. Doctors may recommend this procedure for a number of different reasons. The most common reason is to evaluate chest pain. Chest pain can be a symptom of coronary artery disease (CAD), and cardiac catheterization can show whether plaque is narrowing or blocking your heart's arteries. This procedure is also used to evaluate the valves, as well as measure the blood flow and oxygen levels in different parts of your heart. For further information please visit HugeFiesta.[email or web address]. Please follow instruction sheet, as given.    Follow-Up: At [facility], you and your health needs are our priority.  As part of our continuing mission to provide you with exceptional heart care, we have created designated Provider Care Teams.  These Care Teams include your primary Cardiologist (physician) and Advanced Practice Providers (APPs -  Physician Assistants and Nurse Practitioners) who all work together to provide you with the care you need, when you need it.  We recommend signing  up for the patient portal called "MyChart".  Sign up information is provided on this After Visit Summary.  MyChart is used to connect  with patients for Virtual Visits (Telemedicine).  Patients are able to view lab/test results, encounter notes, upcoming appointments, etc.  Non-urgent messages can be sent to your provider as well.   To learn more about what you can do with MyChart, go to [email or web address].    Your next appointment:   2 week(s)  The format for your next appointment:   In Person  Provider:   You will see one of the following Advanced Practice Providers on your designated Care Team:   Bernerd Pho, PA-C  Ermalinda Barrios, PA-C     Other Instructions Thank you for choosing [place]!     [address] [place] [address] Dept: [phone] Loc: [place]  01/23/2021  You are scheduled for a Cardiac Catheterization on Monday, June 20 with Dr. Sherren Mocha.  1. Please arrive at the [facility] (Main Entrance A) at [facility]: [address] [place], [place] [address] at 9:00 AM (This time is two hours before your procedure to ensure your preparation). Free valet parking service is available.   Special note: Every effort is made to have your procedure done on time. Please understand that emergencies sometimes delay scheduled procedures.  2. Diet: Do not eat solid foods after midnight.  The patient may have clear liquids until 5am upon the day of the procedure.  3. Labs: You will need to have blood drawn on Tuesday, June 14 at [facility] [address], [place]  Open: 7am - 6pm, Sat 8am - 12 noon   Phone: [phone]. You do not need to be fasting.  4. Medication instructions in preparation for your procedure:   Contrast Allergy: No   Stop taking Eliquis (Apixiban) on Friday, June 17.   Do not take Torsemide or Spironolactone the morning of your procedure.   On the morning of your procedure, take your Aspirin and any morning medicines NOT  listed above.  You may use sips of water.  5. Plan for one night stay--bring personal belongings. 6. Bring a current list of your medications and current insurance cards. 7. You MUST have a responsible person to drive you home. 8. Someone MUST be with you the first 24 hours after you arrive home or your discharge will be delayed. 9. Please wear clothes that are easy to get on and off and wear slip-on shoes.  Thank you for allowing [place] to care for you!   -- [facility]    Signed, Waynetta Pean  01/23/2021 7:35 PM    [place] [address] [place], [place] [address] Phone: [phone] Fax: [phone]

## 2021-01-23 NOTE — Patient Instructions (Signed)
Medication Instructions:  Your physician recommends that you continue on your current medications as directed. Please refer to the Current Medication list given to you today.   *If you need a refill on your cardiac medications before your next appointment, please call your pharmacy*   Lab Work: Your physician recommends that you return for lab work in: Today   If you have labs (blood work) drawn today and your tests are completely normal, you will receive your results only by: MyChart Message (if you have MyChart) OR A paper copy in the mail If you have any lab test that is abnormal or we need to change your treatment, we will call you to review the results.   Testing/Procedures: Your physician has requested that you have a cardiac catheterization. Cardiac catheterization is used to diagnose and/or treat various heart conditions. Doctors may recommend this procedure for a number of different reasons. The most common reason is to evaluate chest pain. Chest pain can be a symptom of coronary artery disease (CAD), and cardiac catheterization can show whether plaque is narrowing or blocking your heart's arteries. This procedure is also used to evaluate the valves, as well as measure the blood flow and oxygen levels in different parts of your heart. For further information please visit HugeFiesta.[email or web address]. Please follow instruction sheet, as given.    Follow-Up: At [facility], you and your health needs are our priority.  As part of our continuing mission to provide you with exceptional heart care, we have created designated Provider Care Teams.  These Care Teams include your primary Cardiologist (physician) and Advanced Practice Providers (APPs -  Physician Assistants and Nurse Practitioners) who all work together to provide you with the care you need, when you need it.  We recommend signing up for the patient portal called "MyChart".  Sign up information is provided on this After Visit  Summary.  MyChart is used to connect with patients for Virtual Visits (Telemedicine).  Patients are able to view lab/test results, encounter notes, upcoming appointments, etc.  Non-urgent messages can be sent to your provider as well.   To learn more about what you can do with MyChart, go to [email or web address].    Your next appointment:   2 week(s)  The format for your next appointment:   In Person  Provider:   You will see one of the following Advanced Practice Providers on your designated Care Team:   Bernerd Pho, PA-C  Ermalinda Barrios, PA-C     Other Instructions Thank you for choosing [place]!     [place] Kelly [id number] Dept: [phone] Loc: [place]  01/23/2021  You are scheduled for a Cardiac Catheterization on Monday, June 20 with Dr. Sherren Mocha.  1. Please arrive at the [facility] (Main Entrance A) at [facility]: [address] [place], [place] [address] at 9:00 AM (This time is two hours before your procedure to ensure your preparation). Free valet parking service is available.   Special note: Every effort is made to have your procedure done on time. Please understand that emergencies sometimes delay scheduled procedures.  2. Diet: Do not eat solid foods after midnight.  The patient may have clear liquids until 5am upon the day of the procedure.  3. Labs: You will need to have blood drawn on Tuesday, June 14 at [facility] [address], [place]  Open: 7am -  6pm, Sat 8am - 12 noon   Phone: [phone]. You do not need to be fasting.  4. Medication instructions in preparation for your procedure:   Contrast Allergy: No   Stop taking Eliquis (Apixiban) on Friday, June 17.   Do not take Torsemide or Spironolactone the morning of your procedure.   On the morning of your procedure, take your  Aspirin and any morning medicines NOT listed above.  You may use sips of water.  5. Plan for one night stay--bring personal belongings. 6. Bring a current list of your medications and current insurance cards. 7. You MUST have a responsible person to drive you home. 8. Someone MUST be with you the first 24 hours after you arrive home or your discharge will be delayed. 9. Please wear clothes that are easy to get on and off and wear slip-on shoes.  Thank you for allowing [place] to care for you!   -- [place] [facility]

## 2021-01-23 NOTE — Progress Notes (Signed)
Called and went over xray results per Dr Sood with patient. All questions answered and patient expressed full understanding. Nothing further needed at this time.

## 2021-01-23 NOTE — Progress Notes (Signed)
Cardiology Office Note    Date:  01/23/2021   ID:  Jordan Kelly, DOB 15-Jun-1961, MRN [id number]  PCP:  Jordan Larsson, NP  Cardiologist:  Evaluated by Dr. Gasper Kelly and Dr. Audie Kelly during admission but requests to follow-up in [facility] Complaint  Patient presents with   Follow-up    6 week visit    History of Present Illness:    Jordan Kelly is a 60 y.o. male with past medical history of COPD, tobacco use and alcohol use who presents to the office today for 6-week follow-up.   He was last examined by myself in 11/2020 for hospital follow-up duirng which he was found to have new-onset atrial flutter with RVR and required TEE/DCCV during admission. His TEE also showed a secundum ASD with predominantly right to left shunting across the atrial septum and diameter of his ASD was approximately 0.5 cm. Images were reviewed by Dr. Audie Kelly during admission and felt to be most consistent with a PFO and did not explain his decreased RV function. Was recommended to have a thorough work-up for pulmonary HTN as an outpatient. At the time of his visit, he reported significant improvement in his dyspnea since his hospitalization and denied any chest Kelly or palpitations. Had reduced his tobacco use to less than 5 cigarettes daily and reduced his alcohol use from a 6-pack daily to less than 3 beers since hospital discharge. Risks and benefits for [facility] were reviewed at the time of his visit and he was in agreement to proceed but wanted to call back and schedule but this was delayed given the contrast shortage. PFT's were also ordered but have not been obtained. He was evaluated by Pulmonology on 01/22/2021 and was recommended to defer PFT's until after his cardiac work-up. He was set-up for a home sleep study.   In talking with the patient today, he does report having occasional episodes of chest discomfort since his last visit which can occur at rest or with activity and resolve within a  minute. He reports his breathing has overall been stable and denies any specific orthopnea, PND or pitting edema. Reports occasional palpitations but no persistent symptoms. Reports good compliance with Eliquis and denies any melena, hematochezia or hematuria. He does wish to move forward with his cardiac catheterization as previously discussed at his prior office visit.   Past Medical History:  Diagnosis Date   Atrial flutter (HCC)    COPD (chronic obstructive pulmonary disease) (HCC)    Depression    PFO (patent foramen ovale)    Psoriasis     Past Surgical History:  Procedure Laterality Date   BUBBLE STUDY  11/08/2020   Procedure: BUBBLE STUDY;  Surgeon: Jordan Pain, MD;  Location: [place];  Service: Cardiovascular;;   CARDIOVERSION N/A 11/08/2020   Procedure: CARDIOVERSION;  Surgeon: Jordan Pain, MD;  Location: [facility] ENDOSCOPY;  Service: Cardiovascular;  Laterality: N/A;   HERNIA REPAIR     right inguinal hernia   TEE WITHOUT CARDIOVERSION N/A 11/08/2020   Procedure: TRANSESOPHAGEAL ECHOCARDIOGRAM (TEE);  Surgeon: Jordan Pain, MD;  [facility] END[facility]COPY;  Service: Cardiovascular;  Laterality: N/A;    Current Medications: Outpatient Medications Prior to Visit  Medication Sig Dispense Refill   albuterol (VENTOLIN HFA) 108 (90 Base) MCG/ACT inhaler Inhale 2 puffs into the lungs every 6 (six) hours as needed for wheezing or shortness of breath. 8 g 5   apixaban (ELIQUIS) 5 MG TABS tablet Take 1  tablet (5 mg total) by mouth 2 (two) times daily. 120 tablet 2   bisacodyl (DULCOLAX) 10 MG suppository Place 1 suppository (10 mg total) rectally as needed for moderate constipation. 12 suppository 0   bisoprolol (ZEBETA) 5 MG tablet Take 1 tablet (5 mg total) by mouth daily. 90 tablet 2   fluticasone-salmeterol (ADVAIR DISKUS) 250-50 MCG/ACT AEPB Inhale 1 puff into the lungs in the morning and at bedtime. 60 each 5   guaiFENesin (MUCINEX) 600 MG 12 hr tablet Take 600 mg by mouth 2  (two) times daily as needed.     Multiple Vitamin (MULTIVITAMIN WITH MINERALS) TABS tablet Take 1 tablet by mouth daily. 30 tablet 3   nicotine (NICODERM CQ - DOSED IN MG/24 HOURS) 21 mg/24hr patch Place 1 patch (21 mg total) onto the skin daily. 28 patch 0   ondansetron (ZOFRAN-ODT) 4 MG disintegrating tablet Take 1 tablet (4 mg total) by mouth every 8 (eight) hours as needed for nausea or vomiting. 20 tablet 0   sodium phosphate (FLEET) 7-19 GM/118ML ENEM Place 133 mLs (1 enema total) rectally daily as needed for severe constipation. 133 mL 0   spironolactone (ALDACTONE) 25 MG tablet Take 1/2 tablet (12.5 mg total) by mouth daily. 90 tablet 2   torsemide (DEMADEX) 20 MG tablet Take 1 tablet (20 mg total) by mouth daily. 90 tablet 2   triamcinolone cream (KENALOG) 0.5 % Apply topically 2 (two) times daily. 30 g 0   No facility-administered medications prior to visit.     Allergies:   Patient has no known allergies.   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  Tobacco Use   Smoking status: Every Day    Packs/day: 0.25    Pack years: 0.00    Types: Cigarettes   Smokeless tobacco: Never   Tobacco comments:    Smokes 5 cigarettes per day 01/22/21  Vaping Use   Vaping Use: Never used  Substance and Sexual Activity   Alcohol use: Yes    Alcohol/week: 14.0 standard drinks    Types: 14 Cans of beer per week    Comment: 2   Drug use: Yes    Frequency: 5.0 times per week    Types: Marijuana    Comment: last smoked 11/03/20   Sexual activity: Not Currently  Other Topics Concern   Not on file  Social History Narrative   Not on file   Social Determinants of Health   Financial Resource Strain: Not on file  Food Insecurity: No Food Insecurity    Worried About Estate manager/land agent of Food in the Last Year: Never true   Ran Out of Food in the Last Year: Never true  Transportation Needs: No Transportation Needs   Lack of Transportation (Medical): No   Lack of Transportation (Non-Medical): No  Physical Activity: Not on file  Stress: Not on file  Social Connections: Not on file     Family History:  The patient's family history includes COPD in his father.   Review of Systems:    Please see the history of present illness.     All other systems reviewed and are otherwise negative except as noted above.   Physical Exam:  VS:  BP 110/68   Pulse 75   Ht 5\' 10"  (1.778 m)   Wt 158 lb 12.8 oz (72 kg)   SpO2 97%   BMI 22.79 kg/m    General: Well developed, well nourished,male appearing in no acute distress. Head: Normocephalic, atraumatic. Neck: No carotid bruits. JVD not elevated.  Lungs: Respirations regular and unlabored, without wheezes or rales.  Heart: Regular rate and rhythm. No S3 or S4.  No murmur, no rubs, or gallops appreciated. Abdomen: Appears non-distended. No obvious abdominal masses. Msk:  Strength and tone appear normal for age. No obvious joint deformities or effusions. Extremities: No clubbing or cyanosis. No pitting edema.  Distal pedal pulses are 2+ bilaterally. Neuro: Alert and oriented X 3. Moves all extremities spontaneously. No focal deficits noted. Psych:  Responds to questions appropriately with a normal affect. Skin: No rashes or lesions noted  Wt Readings from Last 3 Encounters:  01/23/21 158 lb 12.8 oz (72 kg)  01/22/21 156 lb (70.8 kg)  12/12/20 158 lb (71.7 kg)     Studies/Labs Reviewed:   EKG:  EKG is not ordered today.   Recent Labs: 11/05/2020: TSH 1.843 11/08/2020: B Natriuretic Peptide 1,061.0 11/14/2020: Magnesium 1.8 12/19/2020: ALT 18; BUN 9; Creatinine, Ser 0.76; Hemoglobin 16.2; Platelets 411; Potassium 4.2; Sodium 138   Lipid Panel    Component Value Date/Time   CHOL 175 12/19/2020  1121   TRIG 68 12/19/2020 1121   HDL 69 12/19/2020 1121   LDLCALC 93 12/19/2020 1121    Additional studies/ records that were reviewed today include:   Echocardiogram: 10/2020 IMPRESSIONS     1. Left ventricular ejection fraction, by estimation, is 45 to 50%. The  left ventricle has mildly decreased function. The left ventricle  demonstrates global hypokinesis. Left ventricular diastolic function could  not be evaluated. There is the  interventricular septum is flattened in diastole ('D' shaped left  ventricle), consistent with right ventricular volume overload. The average  left ventricular global longitudinal strain is -12.0 %. The global  longitudinal strain is abnormal.   2. Right ventricular systolic function is moderately reduced. The right  ventricular size is moderately enlarged. There is mildly elevated  pulmonary artery systolic pressure.   3. Right atrial size was severely dilated.   4. The mitral valve is normal in structure. No evidence of mitral valve  regurgitation.   5. The aortic valve is tricuspid. Aortic valve regurgitation is not  visualized. No aortic stenosis is present.   6. The inferior vena cava is dilated in size with <50% respiratory  variability, suggesting right atrial pressure of 15 mmHg.   7. Evidence of atrial level shunting detected by color flow Doppler.  There is a small secundum atrial septal defect with bidirectional shunting  across the atrial septum.   Assessment:    1. Pulmonary hypertension ([place])   2. PFO (patent foramen ovale)   3. Chest Kelly of uncertain etiology   4. Typical atrial flutter ([place])   5. Pre-op testing   6. Alcohol abuse      Plan:   In order of problems listed above:  1. Pulmonary HTN/RV Failure/PFO - Was diagnosed furing his recent admission and his PFO was not felt to explain his RV failure with outpatient workup of his Pulmonary HTN recommended by Dr. Audie Kelly. His catheterization was delayed due to the  contrast shortage but is now able to be scheduled. Reviewed the procedure along with risks and benefits with the patient and he is  in agreement to proceed. Pending results, can proceed with high-resolution Chest CT and PFT's as previously recommended (also being followed by Pulmonology with plans for a home sleep study).  - He remains on Bisoprolol 5mg  daily, Spironolactone 12.5mg  daily and Torsemide 20mg  daily.   2. Chest Kelly with Atypical Features - He does report intermittent chest Kelly which can occur at rest or with activity. Is atypical as symptoms typically resolve within several seconds. Plan for [facility] as outlined above.   3. Typical Atrial Flutter - He is s/p TEE/DCCV in 10/2020. Was in NSR at his last visit and is in NSR by examination today. Continue Bisoprolol 5mg  daily and Eliquis 5mg  BID. He can now hold Eliquis for 48 hours prior to his catheterization given he is more than 1 month out from [place].   4. Alcohol Abuse/Concern for Cirrhosis - Labs last month showed his AST was at 19 and ALT at 18. He has reduced his alcohol use significantly and was congratulated on this. Has been referred to GI by his PCP. He remains on Spironolactone.    Shared Decision Making/Informed Consent:   Shared Decision Making/Informed Consent The risks [stroke (1 in 1000), death (1 in 1000), kidney failure [usually temporary] (1 in 500), bleeding (1 in 200), allergic reaction [possibly serious] (1 in 200)], benefits (diagnostic support and management of coronary artery disease) and alternatives of a cardiac catheterization were discussed in detail with Mr. Eltzroth and he is willing to proceed.    Medication Adjustments/Labs and Tests Ordered: Current medicines are reviewed at length with the patient today.  Concerns regarding medicines are outlined above.  Medication changes, Labs and Tests ordered today are listed in the Patient Instructions below. Patient Instructions  Medication Instructions:  Your  physician recommends that you continue on your current medications as directed. Please refer to the Current Medication list given to you today.   *If you need a refill on your cardiac medications before your next appointment, please call your pharmacy*   Lab Work: Your physician recommends that you return for lab work in: Today   If you have labs (blood work) drawn today and your tests are completely normal, you will receive your results only by: MyChart Message (if you have MyChart) OR A paper copy in the mail If you have any lab test that is abnormal or we need to change your treatment, we will call you to review the results.   Testing/Procedures: Your physician has requested that you have a cardiac catheterization. Cardiac catheterization is used to diagnose and/or treat various heart conditions. Doctors may recommend this procedure for a number of different reasons. The most common reason is to evaluate chest Kelly. Chest Kelly can be a symptom of coronary artery disease (CAD), and cardiac catheterization can show whether plaque is narrowing or blocking your heart's arteries. This procedure is also used to evaluate the valves, as well as measure the blood flow and oxygen levels in different parts of your heart. For further information please visit [email or web address]. Please follow instruction sheet, as given.    Follow-Up: At [facility], you and your health needs are our priority.  As part of our continuing mission to provide you with exceptional heart care, we have created designated Provider Care Teams.  These Care Teams include your primary Cardiologist (physician) and Advanced Practice Providers (APPs -  Physician Assistants and Nurse Practitioners) who all work together to provide you with the care you need, when you need it.  We recommend signing  up for the patient portal called "MyChart".  Sign up information is provided on this After Visit Summary.  MyChart is used to connect  with patients for Virtual Visits (Telemedicine).  Patients are able to view lab/test results, encounter notes, upcoming appointments, etc.  Non-urgent messages can be sent to your provider as well.   To learn more about what you can do with MyChart, go to [email or web address].    Your next appointment:   2 week(s)  The format for your next appointment:   In Person  Provider:   You will see one of the following Advanced Practice Providers on your designated Care Team:   Bernerd Pho, PA-C  Ermalinda Barrios, PA-C     Other Instructions Thank you for choosing [facility]!     [place] [address] Dept: [phone] Loc: [place]  01/23/2021  You are scheduled for a Cardiac Catheterization on Monday, June 20 with Dr. Sherren Mocha.  1. Please arrive at the [facility] (Main Entrance A) at [facility]: [address] [place], [place] [address] at 9:00 AM (This time is two hours before your procedure to ensure your preparation). Free valet parking service is available.   Special note: Every effort is made to have your procedure done on time. Please understand that emergencies sometimes delay scheduled procedures.  2. Diet: Do not eat solid foods after midnight.  The patient may have clear liquids until 5am upon the day of the procedure.  3. Labs: You will need to have blood drawn on Tuesday, June 14 at [place] [address], [place]  Open: 7am - 6pm, Sat 8am - 12 noon   Phone: [phone]. You do not need to be fasting.  4. Medication instructions in preparation for your procedure:   Contrast Allergy: No   Stop taking Eliquis (Apixiban) on Friday, June 17.   Do not take Torsemide or Spironolactone the morning of your procedure.   On the morning of your procedure, take your Aspirin and any morning medicines NOT  listed above.  You may use sips of water.  5. Plan for one night stay--bring personal belongings. 6. Bring a current list of your medications and current insurance cards. 7. You MUST have a responsible person to drive you home. 8. Someone MUST be with you the first 24 hours after you arrive home or your discharge will be delayed. 9. Please wear clothes that are easy to get on and off and wear slip-on shoes.  Thank you for allowing [place] to care for you!   -- [place] [facility]    Signed, Waynetta Pean  01/23/2021 7:35 PM    [place] [address] [place], [place] [address] Phone: [phone] Fax: [phone]

## 2021-01-24 ENCOUNTER — Telehealth: Payer: Self-pay

## 2021-01-24 MED ORDER — TORSEMIDE 20 MG PO TABS: ORAL_TABLET | ORAL | 6 refills | Status: DC

## 2021-01-24 NOTE — Telephone Encounter (Signed)
I discussed results with patient and he agrees to reduce torsemide to 10 mg daily. May take 20 mg for wt gain over 2 lbs in 24 hrs or 5 lbs in a week.

## 2021-01-24 NOTE — Telephone Encounter (Signed)
-----   Message from Erma Heritage, [place] sent at 01/24/2021 10:10 AM EDT ----- Please let the patient know his pre-procedure labs show stable hemoglobin and platelets. Kidney function remains stable. His potassium is within a normal range but sodium level is slightly low. Given no evidence of volume overload by examination, would recommend reducing his Torsemide from 20mg  daily to 10mg  daily (can cut his current tablets in half). If he notices a weight gain of 2 lbs overnight, 5 lbs in a week, or worsening edema then he should take the entire tablet.

## 2021-01-25 ENCOUNTER — Telehealth: Payer: Self-pay | Admitting: *Deleted

## 2021-01-25 NOTE — Telephone Encounter (Signed)
Patient returning call. He states he is at work, but will try to answer.

## 2021-01-25 NOTE — Telephone Encounter (Addendum)
Pt contacted pre-catheterization scheduled at [facility] for: Monday January 29, 2021 11:30 AM Verified arrival time and place: [facility]) at: 9:30 AM   No solid food after midnight prior to cath, clear liquids until 5 AM day of procedure.  Hold: Eliquis-none 01/27/21 until post procedure Spironolactone/Torsemide-AM of procedure  Except hold medications AM meds can be  taken pre-cath with sips of water including: ASA 81 mg   Confirmed patient has responsible adult to drive home post procedure and be with patient first 24 hours after arriving home: yes  You are allowed ONE visitor in the waiting room during the time you are at the hospital for your procedure. Both you and your visitor must wear a mask once you enter the hospital.   Patient reports does not currently have any symptoms concerning for COVID-19 and no household members with COVID-19 like illness.       Reviewed procedure/mask/visitor instructions with patient.

## 2021-01-25 NOTE — Telephone Encounter (Signed)
No answer, voicemail message. 

## 2021-01-29 ENCOUNTER — Ambulatory Visit (HOSPITAL_COMMUNITY)
Admission: RE | Admit: 2021-01-29 | Discharge: 2021-01-29 | Disposition: A | Discharge status: Home / Self Care | Payer: 59 | Attending: Cardiovascular Disease | Admitting: Cardiovascular Disease

## 2021-01-29 ENCOUNTER — Ambulatory Visit (HOSPITAL_COMMUNITY)
Admission: RE | Disposition: A | Discharge status: Home / Self Care | Payer: Self-pay | Source: Home / Self Care | Attending: Cardiovascular Disease

## 2021-01-29 ENCOUNTER — Other Ambulatory Visit: Payer: Self-pay

## 2021-01-29 DIAGNOSIS — Z7901 Long term (current) use of anticoagulants: Secondary | ICD-10-CM | POA: Diagnosis not present

## 2021-01-29 DIAGNOSIS — F1010 Alcohol abuse, uncomplicated: Secondary | ICD-10-CM | POA: Insufficient documentation

## 2021-01-29 DIAGNOSIS — Z79899 Other long term (current) drug therapy: Secondary | ICD-10-CM | POA: Insufficient documentation

## 2021-01-29 DIAGNOSIS — Q211 Atrial septal defect: Secondary | ICD-10-CM | POA: Insufficient documentation

## 2021-01-29 DIAGNOSIS — I272 Pulmonary hypertension, unspecified: Secondary | ICD-10-CM | POA: Insufficient documentation

## 2021-01-29 DIAGNOSIS — J449 Chronic obstructive pulmonary disease, unspecified: Secondary | ICD-10-CM | POA: Diagnosis not present

## 2021-01-29 DIAGNOSIS — I483 Typical atrial flutter: Secondary | ICD-10-CM | POA: Insufficient documentation

## 2021-01-29 DIAGNOSIS — F17210 Nicotine dependence, cigarettes, uncomplicated: Secondary | ICD-10-CM | POA: Diagnosis not present

## 2021-01-29 DIAGNOSIS — I270 Primary pulmonary hypertension: Secondary | ICD-10-CM | POA: Diagnosis not present

## 2021-01-29 DIAGNOSIS — R079 Chest pain, unspecified: Secondary | ICD-10-CM | POA: Insufficient documentation

## 2021-01-29 DIAGNOSIS — I2510 Atherosclerotic heart disease of native coronary artery without angina pectoris: Secondary | ICD-10-CM | POA: Insufficient documentation

## 2021-01-29 HISTORY — PX: RIGHT/LEFT HEART CATH AND CORONARY ANGIOGRAPHY: CATH118266

## 2021-01-29 LAB — POCT I-STAT EG7
Acid-Base Excess: 0 mmol/L (ref 0.0–2.0)
Bicarbonate: 26.9 mmol/L (ref 20.0–28.0)
Calcium, Ion: 1.21 mmol/L (ref 1.15–1.40)
HCT: 42 % (ref 39.0–52.0)
Hemoglobin: 14.3 g/dL (ref 13.0–17.0)
O2 Saturation: 73 %
Potassium: 4.3 mmol/L (ref 3.5–5.1)
Sodium: 138 mmol/L (ref 135–145)
TCO2: 29 mmol/L (ref 22–32)
pCO2, Ven: 53.5 mmHg (ref 44.0–60.0)
pH, Ven: 7.31 (ref 7.250–7.430)
pO2, Ven: 43 mmHg (ref 32.0–45.0)

## 2021-01-29 LAB — POCT I-STAT 7, (LYTES, BLD GAS, ICA,H+H)
Acid-Base Excess: 0 mmol/L (ref 0.0–2.0)
Bicarbonate: 26.8 mmol/L (ref 20.0–28.0)
Calcium, Ion: 1.19 mmol/L (ref 1.15–1.40)
HCT: 43 % (ref 39.0–52.0)
Hemoglobin: 14.6 g/dL (ref 13.0–17.0)
O2 Saturation: 95 %
Potassium: 4.3 mmol/L (ref 3.5–5.1)
Sodium: 137 mmol/L (ref 135–145)
TCO2: 28 mmol/L (ref 22–32)
pCO2 arterial: 51.8 mmHg — ABNORMAL HIGH (ref 32.0–48.0)
pH, Arterial: 7.322 — ABNORMAL LOW (ref 7.350–7.450)
pO2, Arterial: 84 mmHg (ref 83.0–108.0)

## 2021-01-29 SURGERY — RIGHT/LEFT HEART CATH AND CORONARY ANGIOGRAPHY
Anesthesia: LOCAL

## 2021-01-29 MED ORDER — FLUTICASONE-SALMETEROL 250-50 MCG/ACT IN AEPB: 1.0000 | INHALATION_SPRAY | Freq: Two times a day (BID) | RESPIRATORY_TRACT | 0 refills | Status: DC

## 2021-01-29 MED ORDER — SODIUM CHLORIDE 0.9 % WEIGHT BASED INFUSION
3.0000 mL/kg/h | INTRAVENOUS | Status: AC
  Administered 2021-01-29: 3 mL/kg/h via INTRAVENOUS

## 2021-01-29 MED ORDER — MIDAZOLAM HCL 2 MG/2ML IJ SOLN
INTRAMUSCULAR | Status: AC
  Filled 2021-01-29: qty 2

## 2021-01-29 MED ORDER — ACETAMINOPHEN 325 MG PO TABS: 650.0000 mg | ORAL_TABLET | ORAL | Status: DC | PRN

## 2021-01-29 MED ORDER — SODIUM CHLORIDE 0.9% FLUSH: 3.0000 mL | Freq: Two times a day (BID) | INTRAVENOUS | Status: DC

## 2021-01-29 MED ORDER — LIDOCAINE HCL (PF) 1 % IJ SOLN
INTRAMUSCULAR | Status: DC | PRN
  Administered 2021-01-29: 2 mL via SUBCUTANEOUS
  Administered 2021-01-29: 5 mL via SUBCUTANEOUS

## 2021-01-29 MED ORDER — ONDANSETRON HCL 4 MG/2ML IJ SOLN: 4.0000 mg | Freq: Four times a day (QID) | INTRAMUSCULAR | Status: DC | PRN

## 2021-01-29 MED ORDER — SODIUM CHLORIDE 0.9% FLUSH: 3.0000 mL | INTRAVENOUS | Status: DC | PRN

## 2021-01-29 MED ORDER — SODIUM CHLORIDE 0.9 % IV SOLN: INTRAVENOUS | Status: DC

## 2021-01-29 MED ORDER — LIDOCAINE HCL (PF) 1 % IJ SOLN
INTRAMUSCULAR | Status: AC
  Filled 2021-01-29: qty 30

## 2021-01-29 MED ORDER — IOHEXOL 350 MG/ML SOLN
INTRAVENOUS | Status: DC | PRN
  Administered 2021-01-29: 45 mL via INTRA_ARTERIAL

## 2021-01-29 MED ORDER — FENTANYL CITRATE (PF) 100 MCG/2ML IJ SOLN
INTRAMUSCULAR | Status: AC
  Filled 2021-01-29: qty 2

## 2021-01-29 MED ORDER — MIDAZOLAM HCL 2 MG/2ML IJ SOLN
INTRAMUSCULAR | Status: DC | PRN
  Administered 2021-01-29: 2 mg via INTRAVENOUS

## 2021-01-29 MED ORDER — SODIUM CHLORIDE 0.9 % WEIGHT BASED INFUSION: 1.0000 mL/kg/h | INTRAVENOUS | Status: DC

## 2021-01-29 MED ORDER — HEPARIN (PORCINE) IN NACL 1000-0.9 UT/500ML-% IV SOLN
INTRAVENOUS | Status: AC
  Filled 2021-01-29: qty 1000

## 2021-01-29 MED ORDER — HEPARIN (PORCINE) IN NACL 2-0.9 UNITS/ML
INTRAMUSCULAR | Status: DC | PRN
  Administered 2021-01-29: 10 mL via INTRA_ARTERIAL

## 2021-01-29 MED ORDER — VERAPAMIL HCL 2.5 MG/ML IV SOLN
INTRAVENOUS | Status: AC
  Filled 2021-01-29: qty 2

## 2021-01-29 MED ORDER — ASPIRIN 81 MG PO CHEW: 81.0000 mg | CHEWABLE_TABLET | ORAL | Status: DC

## 2021-01-29 MED ORDER — SODIUM CHLORIDE 0.9 % IV SOLN: 250.0000 mL | INTRAVENOUS | Status: DC | PRN

## 2021-01-29 MED ORDER — HEPARIN SODIUM (PORCINE) 1000 UNIT/ML IJ SOLN
INTRAMUSCULAR | Status: DC | PRN
  Administered 2021-01-29: 4000 [IU] via INTRAVENOUS

## 2021-01-29 MED ORDER — FENTANYL CITRATE (PF) 100 MCG/2ML IJ SOLN
INTRAMUSCULAR | Status: DC | PRN
  Administered 2021-01-29: 25 ug via INTRAVENOUS

## 2021-01-29 MED ORDER — HYDRALAZINE HCL 20 MG/ML IJ SOLN: 10.0000 mg | INTRAMUSCULAR | Status: DC | PRN

## 2021-01-29 MED ORDER — LABETALOL HCL 5 MG/ML IV SOLN: 10.0000 mg | INTRAVENOUS | Status: DC | PRN

## 2021-01-29 SURGICAL SUPPLY — 13 items
CATH 5FR JL3.5 JR4 ANG PIG MP (CATHETERS) ×2 IMPLANT
CATH BALLN WEDGE 5F 110CM (CATHETERS) ×2 IMPLANT
DEVICE RAD COMP TR BAND LRG (VASCULAR PRODUCTS) ×2 IMPLANT
GLIDESHEATH SLEND SS 6F .021 (SHEATH) ×2 IMPLANT
GUIDEWIRE .025 260CM (WIRE) ×2 IMPLANT
GUIDEWIRE INQWIRE 1.5J.035X260 (WIRE) ×1 IMPLANT
INQWIRE 1.5J .035X260CM (WIRE) ×2
KIT HEART LEFT (KITS) ×2 IMPLANT
PACK CARDIAC CATHETERIZATION (CUSTOM PROCEDURE TRAY) ×2 IMPLANT
SHEATH GLIDE SLENDER 4/5FR (SHEATH) ×2 IMPLANT
SYR MEDRAD MARK 7 150ML (SYRINGE) ×2 IMPLANT
TRANSDUCER W/STOPCOCK (MISCELLANEOUS) ×2 IMPLANT
TUBING CIL FLEX 10 FLL-RA (TUBING) ×2 IMPLANT

## 2021-01-29 NOTE — Interval H&P Note (Signed)
History and Physical Interval Note:  01/29/2021 11:46 AM  Jordan Kelly  has presented today for surgery, with the diagnosis of hp - chest pain.  The various methods of treatment have been discussed with the patient and family. After consideration of risks, benefits and other options for treatment, the patient has consented to  Procedure(s): RIGHT/LEFT HEART CATH AND CORONARY ANGIOGRAPHY (N/A) as a surgical intervention.  The patient's history has been reviewed, patient examined, no change in status, stable for surgery.  I have reviewed the patient's chart and labs.  Questions were answered to the patient's satisfaction.     Sherren Mocha

## 2021-01-30 ENCOUNTER — Encounter (HOSPITAL_COMMUNITY): Payer: Self-pay | Admitting: Cardiovascular Disease

## 2021-01-30 MED FILL — Heparin Sod (Porcine)-NaCl IV Soln 1000 Unit/500ML-0.9%: INTRAVENOUS | Qty: 1000 | Status: AC

## 2021-02-08 ENCOUNTER — Ambulatory Visit: Payer: 59 | Admitting: Internal Medicine

## 2021-02-14 ENCOUNTER — Other Ambulatory Visit (HOSPITAL_COMMUNITY): Payer: Self-pay

## 2021-02-20 ENCOUNTER — Telehealth: Payer: Self-pay | Admitting: Cardiovascular Disease

## 2021-02-20 NOTE — Telephone Encounter (Signed)
Pt aware appt is with Bernerd Pho PA at the [place] location ./cy

## 2021-02-20 NOTE — Telephone Encounter (Signed)
Told the patient that I do not see where he see Dr. Burt Knack on 02/21/21. Patient is adamant that he does see Dr. Burt Knack on 02/21/21. Told patient that he see Bernerd Pho tomorrow for f/u for heart cath. Please call back

## 2021-02-21 ENCOUNTER — Other Ambulatory Visit: Payer: Self-pay

## 2021-02-21 ENCOUNTER — Encounter: Payer: Self-pay | Admitting: Student

## 2021-02-21 ENCOUNTER — Telehealth: Payer: Self-pay | Admitting: Student

## 2021-02-21 ENCOUNTER — Ambulatory Visit (INDEPENDENT_AMBULATORY_CARE_PROVIDER_SITE_OTHER): Payer: 59 | Admitting: Student

## 2021-02-21 VITALS — BP 112/70 | HR 76 | Ht 70.5 in | Wt 161.0 lb

## 2021-02-21 DIAGNOSIS — Z720 Tobacco use: Secondary | ICD-10-CM

## 2021-02-21 DIAGNOSIS — Q211 Atrial septal defect: Secondary | ICD-10-CM

## 2021-02-21 DIAGNOSIS — I2510 Atherosclerotic heart disease of native coronary artery without angina pectoris: Secondary | ICD-10-CM

## 2021-02-21 DIAGNOSIS — Q2112 Patent foramen ovale: Secondary | ICD-10-CM

## 2021-02-21 DIAGNOSIS — I483 Typical atrial flutter: Secondary | ICD-10-CM | POA: Diagnosis not present

## 2021-02-21 DIAGNOSIS — I272 Pulmonary hypertension, unspecified: Secondary | ICD-10-CM

## 2021-02-21 MED ORDER — NYSTATIN 100000 UNIT/GM EX CREA: 1.0000 "application " | TOPICAL_CREAM | Freq: Two times a day (BID) | CUTANEOUS | 0 refills | Status: DC

## 2021-02-21 MED ORDER — BISOPROLOL FUMARATE 5 MG PO TABS: 5.0000 mg | ORAL_TABLET | Freq: Every day | ORAL | 3 refills | Status: DC

## 2021-02-21 MED ORDER — TORSEMIDE 20 MG PO TABS: 20.0000 mg | ORAL_TABLET | ORAL | 3 refills | Status: DC | PRN

## 2021-02-21 NOTE — Patient Instructions (Signed)
Medication Instructions:  Your physician recommends that you continue on your current medications as directed. Please refer to the Current Medication list given to you today.  Decrease Torsemide to 20 mg as needed   *If you need a refill on your cardiac medications before your next appointment, please call your pharmacy*   Lab Work: NONE   If you have labs (blood work) drawn today and your tests are completely normal, you will receive your results only by: [place] (if you have MyChart) OR A paper copy in the mail If you have any lab test that is abnormal or we need to change your treatment, we will call you to review the results.   Testing/Procedures: NONE    Follow-Up: At [facility], you and your health needs are our priority.  As part of our continuing mission to provide you with exceptional heart care, we have created designated Provider Care Teams.  These Care Teams include your primary Cardiologist (physician) and Advanced Practice Providers (APPs -  Physician Assistants and Nurse Practitioners) who all work together to provide you with the care you need, when you need it.  We recommend signing up for the patient portal called "MyChart".  Sign up information is provided on this After Visit Summary.  MyChart is used to connect with patients for Virtual Visits (Telemedicine).  Patients are able to view lab/test results, encounter notes, upcoming appointments, etc.  Non-urgent messages can be sent to your provider as well.   To learn more about what you can do with MyChart, go to [email or web address].    Your next appointment:   6 month(s)  The format for your next appointment:   In Person  Provider:   Bernerd Pho, PA-C   Other Instructions Thank you for choosing [facility]!

## 2021-02-21 NOTE — Progress Notes (Signed)
Cardiology Office Note    Date:  02/21/2021   ID:  JMARI PELC, DOB 1961/05/29, MRN [id number]  PCP:  Noreene Larsson, NP  Cardiologist: Evaluated by Dr. Gasper Sells and Dr. Audie Box during admission but requests to follow-up in [facility] Complaint  Patient presents with   Follow-up    S/p cardiac catheterization    History of Present Illness:    Jordan Kelly is a 60 y.o. male with past medical history of persistent atrial fibrillation (s/p TEE/DCCV in 10/2020), PFO, COPD, tobacco use and alcohol use who presents to the office today for follow-up from his recent cardiac catheterization.   He was last examined by myself in 01/2021 and reported occasional episodes of chest discomfort at rest or with activity. He reported his breathing had overall been stable and denied any acute changes in this. He did wish to move forward with a cardiac catheterization as previously recommended during his prior visit. This was performed by Dr. Burt Knack on 01/29/2021 and showed only minor nonobstructive coronary disease with patent coronary arteries. He had essentially normal right heart pressures with a PASP of 43 and normal cardiac output of 5.35.  It was felt that his decompensation in 10/2020 was likely due to his atrial flutter at that time.  In talking with the patient today, he reports overall doing well since his cardiac catheterization.  Denies any complications following the procedure. He reports his breathing continues to improve and denies any specific orthopnea, PND or lower extremity edema. No recurrent chest pain or palpitations.  He continues to reduce his tobacco use and is smoking less than 5 cigarettes daily.  Also reports consuming 3-4 beers per week (previously consuming a 6-pack daily).    Past Medical History:  Diagnosis Date   Atrial flutter ([place])    a. s/p TEE/DCCV in 10/2020   COPD (chronic obstructive pulmonary disease) (HCC)    Depression    PFO (patent  foramen ovale)    Psoriasis     Past Surgical History:  Procedure Laterality Date   BUBBLE STUDY  11/08/2020   Procedure: BUBBLE STUDY;  Surgeon: Jerline Pain, MD;  Location: [place];  Service: Cardiovascular;;   CARDIOVERSION N/A 11/08/2020   Procedure: CARDIOVERSION;  Surgeon: Jerline Pain, MD;  Location: [place];  Service: Cardiovascular;  Laterality: N/A;   HERNIA REPAIR     right inguinal hernia   RIGHT/LEFT HEART CATH AND CORONARY ANGIOGRAPHY N/A 01/29/2021   Procedure: RIGHT/LEFT HEART CATH AND CORONARY ANGIOGRAPHY;  Surgeon: Sherren Mocha, MD;  Location: [place] [facility];  Service: Cardiovascular;  Laterality: N/A;   TEE WITHOUT CARDIOVERSION N/A 11/08/2020   Procedure: TRANSESOPHAGEAL ECHOCARDIOGRAM (TEE);  Surgeon: Jerline Pain, MD;  Location: [facility] ENDOSCOPY;  Service: Cardiovascular;  Laterality: N/A;    Current Medications: Outpatient Medications Prior to Visit  Medication Sig Dispense Refill   albuterol (VENTOLIN HFA) 108 (90 Base) MCG/ACT inhaler Inhale 2 puffs into the lungs every 6 (six) hours as needed for wheezing or shortness of breath. 8 g 5   apixaban (ELIQUIS) 5 MG TABS tablet Take 1 tablet (5 mg total) by mouth 2 (two) times daily. 120 tablet 2   bisacodyl (DULCOLAX) 10 MG suppository Place 1 suppository (10 mg total) rectally as needed for moderate constipation. 12 suppository 0   diazePAM (VALIUM PO) Take 2.5 mg by mouth daily.     fluticasone-salmeterol (ADVAIR DISKUS) 250-50 MCG/ACT AEPB Inhale 1 puff into the lungs in  the morning and at bedtime. 1 each 0   guaiFENesin (MUCINEX) 600 MG 12 hr tablet Take 600 mg by mouth 2 (two) times daily as needed for cough.     Multiple Vitamin (MULTIVITAMIN WITH MINERALS) TABS tablet Take 1 tablet by mouth daily. 30 tablet 3   nicotine (NICODERM CQ - DOSED IN MG/24 HOURS) 21 mg/24hr patch Place 1 patch (21 mg total) onto the skin daily. 28 patch 0   ondansetron (ZOFRAN-ODT) 4 MG disintegrating tablet Take 1  tablet (4 mg total) by mouth every 8 (eight) hours as needed for nausea or vomiting. 20 tablet 0   sodium phosphate (FLEET) 7-19 GM/118ML ENEM Place 133 mLs (1 enema total) rectally daily as needed for severe constipation. 133 mL 0   spironolactone (ALDACTONE) 25 MG tablet Take 1/2 tablet (12.5 mg total) by mouth daily. 90 tablet 2   tetrahydrozoline-zinc (VISINE-AC) 0.05-0.25 % ophthalmic solution Place 1-2 drops into both eyes 3 (three) times daily as needed (dry/irritated eyes.).     bisoprolol (ZEBETA) 5 MG tablet Take 1 tablet (5 mg total) by mouth daily. 90 tablet 2   torsemide (DEMADEX) 20 MG tablet Take 10 mg daily. May take 20 mg is weight gain is over 2 lbs in 24 hours or 5 lbs in 1 week 45 tablet 6   triamcinolone cream (KENALOG) 0.5 % Apply topically 2 (two) times daily. 30 g 0   No facility-administered medications prior to visit.     Allergies:   Patient has no known allergies.   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  Tobacco Use   Smoking status: Every Day    Packs/day: 0.25    Pack years: 0.00    Types: Cigarettes   Smokeless tobacco: Never   Tobacco comments:    Smokes 5 cigarettes per day 01/22/21  Vaping Use   Vaping Use: Never used  Substance and Sexual Activity   Alcohol use: Not Currently    Alcohol/week: 14.0 standard drinks    Types: 14 Cans of beer per week    Comment: 2   Drug use: Yes    Frequency: 5.0 times per week    Types: Marijuana    Comment: last smoked 11/03/20   Sexual activity: Not Currently  Other Topics Concern   Not on file  Social History Narrative   Not on file   Social Determinants of Health   Financial Resource Strain: Not on file  Food Insecurity: No Food Insecurity   Worried About  Estate manager/land agent of Food in the Last Year: Never true   Ran Out of Food in the Last Year: Never true  Transportation Needs: No Transportation Needs   Lack of Transportation (Medical): No   Lack of Transportation (Non-Medical): No  Physical Activity: Not on file  Stress: Not on file  Social Connections: Not on file     Family History:  The patient's family history includes COPD in his father.   Review of Systems:    Please see the history of present illness.     All other systems reviewed and are otherwise negative except as noted above.   Physical Exam:    VS:  BP 112/70  Pulse 76   Ht 5' 10.5" (1.791 m)   Wt 161 lb (73 kg)   SpO2 93%   BMI 22.77 kg/m    General: Well developed, well nourished,male appearing in no acute distress. Head: Normocephalic, atraumatic. Neck: No carotid bruits. JVD not elevated.  Lungs: Respirations regular and unlabored, without wheezes or rales.  Heart: Regular rate and rhythm. No S3 or S4.  No murmur, no rubs, or gallops appreciated. Abdomen: Appears non-distended. No obvious abdominal masses. Msk:  Strength and tone appear normal for age. No obvious joint deformities or effusions. Extremities: No clubbing or cyanosis. No pitting edema.  Distal pedal pulses are 2+ bilaterally. Radial cath site stable without ecchymosis or evidence of a hematoma.  Neuro: Alert and oriented X 3. Moves all extremities spontaneously. No focal deficits noted. Psych:  Responds to questions appropriately with a normal affect. Skin: No rashes or lesions noted  Wt Readings from Last 3 Encounters:  02/21/21 161 lb (73 kg)  01/29/21 160 lb (72.6 kg)  01/23/21 158 lb 12.8 oz (72 kg)    Studies/Labs Reviewed:   EKG:  EKG is not ordered today.  Recent Labs: 11/05/2020: TSH 1.843 11/08/2020: B Natriuretic Peptide 1,061.0 11/14/2020: Magnesium 1.8 12/19/2020: ALT 18 01/23/2021: BUN 9; Creat 0.63; Platelets 336 01/29/2021: Hemoglobin 14.6; Potassium 4.3; Sodium 137   Lipid  Panel    Component Value Date/Time   CHOL 175 12/19/2020 1121   TRIG 68 12/19/2020 1121   HDL 69 12/19/2020 1121   LDLCALC 93 12/19/2020 1121    Additional studies/ records that were reviewed today include:   Echocardiogram: 10/2020 IMPRESSIONS     1. Left ventricular ejection fraction, by estimation, is 45 to 50%. The  left ventricle has mildly decreased function. The left ventricle  demonstrates global hypokinesis. Left ventricular diastolic function could  not be evaluated. There is the  interventricular septum is flattened in diastole ('D' shaped left  ventricle), consistent with right ventricular volume overload. The average  left ventricular global longitudinal strain is -12.0 %. The global  longitudinal strain is abnormal.   2. Right ventricular systolic function is moderately reduced. The right  ventricular size is moderately enlarged. There is mildly elevated  pulmonary artery systolic pressure.   3. Right atrial size was severely dilated.   4. The mitral valve is normal in structure. No evidence of mitral valve  regurgitation.   5. The aortic valve is tricuspid. Aortic valve regurgitation is not  visualized. No aortic stenosis is present.   6. The inferior vena cava is dilated in size with <50% respiratory  variability, suggesting right atrial pressure of 15 mmHg.   7. Evidence of atrial level shunting detected by color flow Doppler.  There is a small secundum atrial septal defect with bidirectional shunting  across the atrial septum.   R/LHC: 01/2021 1.  Minor nonobstructive coronary artery disease with patent coronary arteries and no high-grade stenoses 2.  Essentially normal right heart pressures with a pulmonary artery pressure of 43/15, mean 25, normal right atrial pressure of 5 mmHg, and normal cardiac output of 5.35 with an index of 2.81   Recommend: Ongoing efforts at medical therapy.  The patient appears to be very well compensated at present.  It is  possible that he was decompensated with atrial flutter as the precipitant back a few months ago.  He seems to be clinically much better and his right heart pressures are essentially normal with the exception of a borderline pulmonary pressure.  Cardiac  output is preserved and he seems to be doing quite well.  Assessment:    1. Pulmonary hypertension ([place])   2. PFO (patent foramen ovale)   3. Coronary artery disease involving native coronary artery of native heart without angina pectoris   4. Typical atrial flutter ([place])   5. Tobacco abuse      Plan:   In order of problems listed above:  1. Pulmonary HTN/RV Failure/PFO - He was noted to have pulmonary hypertension and reduced RV function during his admission in 10/2020. Recent cardiac catheterization showed normal right heart pressures with PASP of 43 and normal cardiac output. It was felt that his prior decompensation was likely due to atrial flutter at that time. He has been taking low-dose Torsemide 10 mg daily. Given no evidence of volume overload, will reduce to PRN dosing. - He is being followed by Pulmonology with plans for an outpatient sleep study.    2. CAD - Recent cardiac catheterization only showed minor nonobstructive disease with patent coronary arteries. He denies any recurrent anginal symptoms.  - Continue with risk factor modification. He is not on ASA given the need for anticoagulation.  His LDL was at 93 in 12/2020. If this remains above goal of less than 70 despite dietary changes that he has made in the interim, would recommend initiation of statin therapy.  3. Typical Atrial Flutter - He is s/p TEE/DCCV in 10/2020 and has been in normal sinus rhythm since. He denies any recent palpitations and continues to maintain normal sinus rhythm by examination today.  - Continue Bisoprolol 5 mg daily for rate-control and Eliquis 5 mg twice daily for anticoagulation.  4. History of Tobacco Use and Alcohol Use - He is now  smoking less than 5 cigarettes daily and had reduced his alcohol use to less than 3-4 beers per week. Congratulated on his reduction with cessation advised.   Medication Adjustments/Labs and Tests Ordered: Current medicines are reviewed at length with the patient today.  Concerns regarding medicines are outlined above.  Medication changes, Labs and Tests ordered today are listed in the Patient Instructions below. Patient Instructions  Medication Instructions:  Your physician recommends that you continue on your current medications as directed. Please refer to the Current Medication list given to you today.  Decrease Torsemide to 20 mg as needed   *If you need a refill on your cardiac medications before your next appointment, please call your pharmacy*   Lab Work: NONE   If you have labs (blood work) drawn today and your tests are completely normal, you will receive your results only by: [facility] (if you have MyChart) OR A paper copy in the mail If you have any lab test that is abnormal or we need to change your treatment, we will call you to review the results.   Testing/Procedures: NONE    Follow-Up: At [facility], you and your health needs are our priority.  As part of our continuing mission to provide you with exceptional heart care, we have created designated Provider Care Teams.  These Care Teams include your primary Cardiologist (physician) and Advanced Practice Providers (APPs -  Physician Assistants and Nurse Practitioners) who all work together to provide you with the care you need, when you need it.  We recommend signing up for the patient portal called "MyChart".  Sign up information is provided on this After Visit Summary.  MyChart is used to connect with patients for Virtual Visits (Telemedicine).  Patients are able to view  lab/test results, encounter notes, upcoming appointments, etc.  Non-urgent messages can be sent to your provider as well.   To learn more  about what you can do with MyChart, go to [email or web address].    Your next appointment:   6 month(s)  The format for your next appointment:   In Person  Provider:   Bernerd Pho, PA-C   Other Instructions Thank you for choosing [facility]!     Signed, Erma Heritage, PA-C  02/21/2021 4:56 PM    [facility] [address] [place], [place] [address] Phone: [phone] Fax: [phone]

## 2021-02-21 NOTE — Telephone Encounter (Signed)
*  STAT* If patient is at the pharmacy, call can be transferred to refill team.   1. Which medications need to be refilled? (please list name of each medication and dose if known) bisoprolol (ZEBETA) 5 MG tablet  2. Which pharmacy/location (including street and city if local pharmacy) is medication to be sent to? [place]  3. Do they need a 30 day or 90 day supply? 90 days

## 2021-02-21 NOTE — Telephone Encounter (Signed)
Medication refill request approved per B. Ahmed Prima, PA-C

## 2021-02-27 ENCOUNTER — Ambulatory Visit: Payer: 59 | Admitting: *Deleted

## 2021-02-27 DIAGNOSIS — K7030 Alcoholic cirrhosis of liver without ascites: Secondary | ICD-10-CM

## 2021-02-27 DIAGNOSIS — I5082 Biventricular heart failure: Secondary | ICD-10-CM

## 2021-02-27 NOTE — Patient Instructions (Signed)
Visit Information   Goals Addressed             This Visit's Progress    Maintain My Quality of Life related to managment of cirrhosis       Timeframe:  Long-Range Goal Priority:  High Start Date:      01/04/2021                       Expected End Date:     06/06/2021                  Follow Up Date 04/10/21   - discuss treatment options with the doctor or nurse and make shared treatment decisions - continue spending time outdoors at least 3 times a week - continue walking and lifting light weights, walking your dog - take medications as prescribed, call if any further difficulty obtaining eliquis - do what you can and alternate activity with rest - continue to avoid any products containing tylenol   Why is this important?   Having a long-term illness can be scary.  It can also be stressful for you and your caregiver.  These steps may help.    Notes:      Track and Manage Symptoms-Heart Failure       Timeframe:  Long-Range Goal Priority:  High Start Date:         11/30/2020                    Expected End Date:    06/01/2021                   Follow Up Date 04/10/2021   - continue to follow rescue plan, weigh daily and record - eat more whole grains, fruits and vegetables, lean meats and healthy fats - follow rescue plan if symptoms flare-up - know when to call the doctor, call early for change in health status/ symptoms - call doctor if have 3 pound weight gain in one day or 5 pounds in one week - follow up with primary care provider 8/2, Dr. Halford Chessman 8/22 - call if you have any difficulty affording medications    Why is this important?   You will be able to handle your symptoms better if you keep track of them.  Making some simple changes to your lifestyle will help.  Eating healthy is one thing you can do to take good care of yourself.    Notes:         The patient verbalized understanding of instructions, educational materials, and care plan provided today and  declined offer to receive copy of patient instructions, educational materials, and care plan.   Telephone follow up appointment with care management team member scheduled for:  04/10/2021  Jacqlyn Larsen [facility], BSN RN Case Manager [facility] [phone]

## 2021-02-27 NOTE — Chronic Care Management (AMB) (Signed)
Care Management    RN Visit Note  02/27/2021 Name: Jordan Kelly MRN: [id number] DOB: 17-Nov-1960  Subjective: Jordan Kelly is a 60 y.o. year old male who is a primary care patient of Noreene Larsson, NP. The care management team was consulted for assistance with disease management and care coordination needs.    Engaged with patient by telephone for follow up visit in response to provider referral for case management and/or care coordination services.   Consent to Services:   Jordan Kelly was given information about Care Management services today including:  Care Management services includes personalized support from designated clinical staff supervised by his physician, including individualized plan of care and coordination with other care providers 24/7 contact phone numbers for assistance for urgent and routine care needs. The patient may stop case management services at any time by phone call to the office staff.  Patient agreed to services and consent obtained.   Assessment: Review of patient past medical history, allergies, medications, health status, including review of consultants reports, laboratory and other test data, was performed as part of comprehensive evaluation and provision of chronic care management services.   SDOH (Social Determinants of Health) assessments and interventions performed:    Care Plan  No Known Allergies  Outpatient Encounter Medications as of 02/27/2021  Medication Sig Note   albuterol (VENTOLIN HFA) 108 (90 Base) MCG/ACT inhaler Inhale 2 puffs into the lungs every 6 (six) hours as needed for wheezing or shortness of breath.    apixaban (ELIQUIS) 5 MG TABS tablet Take 1 tablet (5 mg total) by mouth 2 (two) times daily. 01/24/2021: On hold as of 01/26/21 due to upcoming procedure   bisoprolol (ZEBETA) 5 MG tablet Take 1 tablet (5 mg total) by mouth daily.    diazePAM (VALIUM PO) Take 2.5 mg by mouth daily. 01/24/2021: Patient take 1/2 tablet of family  medication   fluticasone-salmeterol (ADVAIR DISKUS) 250-50 MCG/ACT AEPB Inhale 1 puff into the lungs in the morning and at bedtime.    Multiple Vitamin (MULTIVITAMIN WITH MINERALS) TABS tablet Take 1 tablet by mouth daily.    nicotine (NICODERM CQ - DOSED IN MG/24 HOURS) 21 mg/24hr patch Place 1 patch (21 mg total) onto the skin daily.    nystatin cream (MYCOSTATIN) Apply 1 application topically 2 (two) times daily.    spironolactone (ALDACTONE) 25 MG tablet Take 1/2 tablet (12.5 mg total) by mouth daily.    tetrahydrozoline-zinc (VISINE-AC) 0.05-0.25 % ophthalmic solution Place 1-2 drops into both eyes 3 (three) times daily as needed (dry/irritated eyes.).    torsemide (DEMADEX) 20 MG tablet Take 1 tablet (20 mg total) by mouth as needed.    bisacodyl (DULCOLAX) 10 MG suppository Place 1 suppository (10 mg total) rectally as needed for moderate constipation. (Patient not taking: Reported on 02/27/2021)    guaiFENesin (MUCINEX) 600 MG 12 hr tablet Take 600 mg by mouth 2 (two) times daily as needed for cough. (Patient not taking: Reported on 02/27/2021)    ondansetron (ZOFRAN-ODT) 4 MG disintegrating tablet Take 1 tablet (4 mg total) by mouth every 8 (eight) hours as needed for nausea or vomiting. (Patient not taking: Reported on 02/27/2021)    sodium phosphate (FLEET) 7-19 GM/118ML ENEM Place 133 mLs (1 enema total) rectally daily as needed for severe constipation. (Patient not taking: Reported on 02/27/2021)    No facility-administered encounter medications on file as of 02/27/2021.    Patient Active Problem List   Diagnosis Date Noted  Abnormal liver diagnostic imaging 12/12/2020   Pulmonary hypertension, primary ([place]) 11/28/2020   PFO (patent foramen ovale) 11/14/2020   Cirrhosis ([place]) 11/14/2020   Psoriasis 11/14/2020   AKI (acute kidney injury) ([place]) 11/14/2020   Acute cor pulmonale ([place]) 11/13/2020   CHF (congestive heart failure) ([place]) 11/05/2020   Congestive heart failure (CHF) ([place])  11/04/2020   Atrial flutter with rapid ventricular response ([place]) 11/04/2020   Encounter to establish care 10/30/2020   Emphysema/COPD ([place]) 10/30/2020   Leg swelling 10/30/2020   Constipation 10/30/2020   Nausea 10/30/2020   Tobacco abuse 01/09/2019   Alcohol abuse 01/09/2019    Conditions to be addressed/monitored:  Cirrhosis, CHF  Care Plan : Heart Failure (Adult)  Updates made by Jordan Mends, RN since 02/27/2021 12:00 AM     Problem: Symptom Exacerbation (Heart Failure)   Priority: High     Long-Range Goal: Symptom Exacerbation Prevented or Minimized   Start Date: 11/30/2020  Expected End Date: 06/01/2021  This Visit's Progress: On track  Recent Progress: On track  Priority: High  Note:   Current Barriers:  Knowledge deficit related to basic heart failure pathophysiology and self care management- needs reinforcement of CHF action plan Financial strain- patient is back at work 35 hours per week at [employer], this is all he is able to do at present due to health issues, pt hopes to increase his hours in the future. Case Manager Clinical Goal(s):  patient will weigh self daily and record patient will verbalize understanding of Heart Failure Action Plan and when to call doctor patient will take all Heart Failure mediations as prescribed patient will weigh daily and record (notifying MD of 3 lb weight gain over night or 5 lb in a week) Interventions:  Collaboration with Noreene Larsson, NP regarding development and update of comprehensive plan of care as evidenced by provider attestation and co-signature Inter-disciplinary care team collaboration (see longitudinal plan of care) Basic overview and discussion of pathophysiology of Heart Failure reinforced Discussed low sodium diet Reminded of importance of daily weight and advised patient to weigh and record daily Reviewed role of diuretics in prevention of fluid overload and management of heart failure and  importance of taking as prescribed Reinforced importance of calling doctor for exacerbation of HF symptoms such as weight gain, edema, etc. Reminded patient to call RN CM or social worker for any continued financial issues such as medication affordability Reviewed with patient contact number for Eliquis Savings Card [phone] - pt has the card and has used it, verbalizes understanding Encouraged pt to continue walking outside daily with dog and practicing energy conservation Reviewed all upcoming appointments- 8/22 Dr. Halford Chessman, 8/24 Hurshel Keys and 8/2 Demetrius Revel Self-Care Activities: Self administers medications as prescribed Attends all scheduled provider appointments Attends church or other social activities Calls provider office for new concerns or questions Patient Goals: - continue to follow rescue plan, weigh daily and record - eat more whole grains, fruits and vegetables, lean meats and healthy fats - follow rescue plan if symptoms flare-up - know when to call the doctor, call early for change in health status/ symptoms - call doctor if have 3 pound weight gain in one day or 5 pounds in one week - follow up with primary care provider 8/2, Dr. Halford Chessman 8/22 - call if you have any difficulty affording medications Follow Up Plan: Telephone follow up appointment with care management team member scheduled for:   04/10/2021    Care Plan : Cirrhosis  Updates made by Jordan Mends, RN since 02/27/2021 12:00 AM     Problem: Health Promotion or Disease Self-Management of Cirrhosis   Priority: High  Not Long-Range Goal: Self-Management Plan Developed   Start Date: 11/30/2020  Expected End Date: 06/01/2021  This Visit's Progress: On track  Recent Progress: On track  Priority: High  e:   Current Barriers:  Ineffective Self Health Maintenance - Does not have HCPOA and living will in place, will consider completing. Financial concerns- pt is back at work 35 hours per week Rides a  moped and sister transports patient to out of town appointments, does not feel he needs to utilize [facility] transportation at present, pt is getting out to walk his dog several times per week, continues using nicoderm patches at times, still smokes approximately 5 cigarettes per day Has eliquis as is able to afford at present Clinical Goal(s):  Collaboration with Noreene Larsson, NP regarding development and update of comprehensive plan of care as evidenced by provider attestation and co-signature Inter-disciplinary care team collaboration (see longitudinal plan of care) patient will work with care management team to address care coordination and chronic disease management needs related to Disease Management Medication Management and Education Medication Assistance  Mental Health Counseling   Interventions:  Evaluation of current treatment plan related to Cirrhosis, Financial constraints related to currently out of work, no income, plans to start back to work part time next week,  Medication procurement- unable to afford eliquis and Mental Health Concerns  self-management and patient's adherence to plan as established by provider. Collaboration with Noreene Larsson, NP regarding development and update of comprehensive plan of care as evidenced by provider attestation       and co-signature Inter-disciplinary care team collaboration (see longitudinal plan of care) Reviewed plans with patient for ongoing care management follow up and provided patient with direct contact information for care management team Reviewed medications with patient importance of taking all medications as prescribed Reminded pt to call doctor for any new concerns such as fluid retention Reviewed importance of avoiding medications such as tylenol Self Care Activities:  Attends all scheduled provider appointments Calls pharmacy for medication refills Attends church or other social activities Calls provider office for new concerns  or questions Patient Goals:  - discuss treatment options with the doctor or nurse and make shared treatment decisions - continue spending time outdoors at least 3 times a week - continue walking and lifting light weights, walking your dog - take medications as prescribed, call if any further difficulty obtaining eliquis - do what you can and alternate activity with rest - continue to avoid any products containing tylenol Follow Up Plan:  Telephone follow up appointment with care management team member scheduled for:    04/10/2021                      Plan: Telephone follow up appointment with care management team member scheduled for: 04/10/21  Jacqlyn Larsen [facility], BSN RN Case Manager [facility] [phone]

## 2021-03-13 ENCOUNTER — Telehealth: Payer: Self-pay

## 2021-03-13 ENCOUNTER — Other Ambulatory Visit: Payer: Self-pay

## 2021-03-13 ENCOUNTER — Ambulatory Visit (INDEPENDENT_AMBULATORY_CARE_PROVIDER_SITE_OTHER): Payer: 59 | Admitting: Nurse Practitioner

## 2021-03-13 ENCOUNTER — Encounter: Payer: Self-pay | Admitting: Nurse Practitioner

## 2021-03-13 VITALS — BP 107/71 | HR 73 | Temp 97.8°F | Ht 70.0 in | Wt 154.0 lb

## 2021-03-13 DIAGNOSIS — K7030 Alcoholic cirrhosis of liver without ascites: Secondary | ICD-10-CM

## 2021-03-13 DIAGNOSIS — J439 Emphysema, unspecified: Secondary | ICD-10-CM | POA: Diagnosis not present

## 2021-03-13 DIAGNOSIS — H6122 Impacted cerumen, left ear: Secondary | ICD-10-CM

## 2021-03-13 DIAGNOSIS — I4891 Unspecified atrial fibrillation: Secondary | ICD-10-CM | POA: Insufficient documentation

## 2021-03-13 DIAGNOSIS — B356 Tinea cruris: Secondary | ICD-10-CM | POA: Diagnosis not present

## 2021-03-13 DIAGNOSIS — N179 Acute kidney failure, unspecified: Secondary | ICD-10-CM | POA: Diagnosis not present

## 2021-03-13 DIAGNOSIS — I50813 Acute on chronic right heart failure: Secondary | ICD-10-CM

## 2021-03-13 DIAGNOSIS — I4892 Unspecified atrial flutter: Secondary | ICD-10-CM | POA: Insufficient documentation

## 2021-03-13 DIAGNOSIS — J0100 Acute maxillary sinusitis, unspecified: Secondary | ICD-10-CM

## 2021-03-13 MED ORDER — KETOCONAZOLE 2 % EX CREA: TOPICAL_CREAM | Freq: Every day | CUTANEOUS | Status: DC

## 2021-03-13 MED ORDER — DEBROX 6.5 % OT SOLN: 5.0000 [drp] | Freq: Two times a day (BID) | OTIC | 0 refills | Status: DC

## 2021-03-13 MED ORDER — KETOCONAZOLE 2 % EX CREA: 1.0000 "application " | TOPICAL_CREAM | Freq: Every day | CUTANEOUS | 0 refills | Status: DC

## 2021-03-13 MED ORDER — AMOXICILLIN-POT CLAVULANATE 875-125 MG PO TABS: 1.0000 | ORAL_TABLET | Freq: Two times a day (BID) | ORAL | 0 refills | Status: DC

## 2021-03-13 NOTE — Assessment & Plan Note (Signed)
-  doing well -no change to meds

## 2021-03-13 NOTE — Telephone Encounter (Signed)
Sent in

## 2021-03-13 NOTE — Telephone Encounter (Signed)
Patient asked today in his visit if Ketoconazole cream 2% be called into [facility]

## 2021-03-13 NOTE — Progress Notes (Signed)
Established Patient Office Visit  Subjective:  Patient ID: Jordan Kelly, male    DOB: 03/13/1961  Age: 60 y.o. MRN: [id number]  CC:  Chief Complaint  Patient presents with   Follow-up    Lab follow up    HPI Jordan Kelly presents for lab follow-up. He had heart cath on 01/29/21 with Dr. Burt Knack and that showed patent coronary arteries. It was felt that his decompensation in 10/2020 was likely due to his atrial flutter at that time.  He has rash to groin that is itching. He has been using leftover ketoconazole cream, and that has been helping, but he would like more.  He is having sinus discharge and sinus pain today. He states this has been ongoing for 10 days.  Past Medical History:  Diagnosis Date   Atrial flutter ([place])    a. s/p TEE/DCCV in 10/2020   COPD (chronic obstructive pulmonary disease) (HCC)    Depression    PFO (patent foramen ovale)    Psoriasis     Past Surgical History:  Procedure Laterality Date   BUBBLE STUDY  11/08/2020   Procedure: BUBBLE STUDY;  Surgeon: Jerline Pain, MD;  Location: [facility];  Service: Cardiovascular;;   CARDIOVERSION N/A 11/08/2020   Procedure: CARDIOVERSION;  Surgeon: Jerline Pain, MD;  Location: [facility];  Service: Cardiovascular;  Laterality: N/A;   HERNIA REPAIR     right inguinal hernia   RIGHT/LEFT HEART CATH AND CORONARY ANGIOGRAPHY N/A 01/29/2021   Procedure: RIGHT/LEFT HEART CATH AND CORONARY ANGIOGRAPHY;  Surgeon: Sherren Mocha, MD;  Location: [facility] CV LAB;  Service: Cardiovascular;  Laterality: N/A;   TEE WITHOUT CARDIOVERSION N/A 11/08/2020   Procedure: TRANSESOPHAGEAL ECHOCARDIOGRAM (TEE);  Surgeon: Jerline Pain, MD;  Location: [facility] ENDOSCOPY;  Service: Cardiovascular;  Laterality: N/A;    Family History  Problem Relation Age of Onset   COPD Father     Social History   Socioeconomic History   Marital status: Divorced    Spouse name: Not on file   Number of children: Not on file   Years of  education: Not on file   Highest education level: Not on file  Occupational History   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  Tobacco Use   Smoking status: Every Day    Packs/day: 0.25    Types: Cigarettes   Smokeless tobacco: Never   Tobacco comments:    Smokes 5 cigarettes per day 01/22/21  Vaping Use   Vaping Use: Never used  Substance and Sexual Activity   Alcohol use: Not Currently    Alcohol/week: 14.0 standard drinks    Types: 14 Cans of beer per week    Comment: 2   Drug use: Yes    Frequency: 5.0 times per week    Types: Marijuana    Comment: last smoked 11/03/20   Sexual activity: Not Currently  Other Topics Concern   Not on file  Social History Narrative   Not on file   Social Determinants of Health   Financial Resource Strain: Not on file  Food Insecurity: No Food Insecurity   Worried About Estate manager/land agent of Food in the Last Year: Never true   Ran Out of Food in the Last Year: Never true  Transportation Needs: No Transportation Needs   Lack of Transportation (Medical): No   Lack of Transportation (Non-Medical): No  Physical Activity: Not on file  Stress: Not on file  Social Connections: Not on  file  Intimate Partner Violence: Not on file    Outpatient Medications Prior to Visit  Medication Sig Dispense Refill   albuterol (VENTOLIN HFA) 108 (90 Base) MCG/ACT inhaler Inhale 2 puffs into the lungs every 6 (six) hours as needed for wheezing or shortness of breath. 8 g 5   apixaban (ELIQUIS) 5 MG TABS tablet Take 1 tablet (5 mg total) by mouth 2 (two) times daily. 120 tablet 2   bisacodyl (DULCOLAX) 10 MG suppository Place 1 suppository (10 mg total) rectally as needed for moderate constipation. 12 suppository 0   bisoprolol (ZEBETA) 5 MG tablet Take 1 tablet (5 mg total) by mouth daily. 90 tablet 3   diazePAM (VALIUM PO) Take 2.5 mg by mouth daily.     fluticasone-salmeterol (ADVAIR DISKUS) 250-50 MCG/ACT AEPB Inhale 1 puff into the lungs in the  morning and at bedtime. 1 each 0   Multiple Vitamin (MULTIVITAMIN WITH MINERALS) TABS tablet Take 1 tablet by mouth daily. 30 tablet 3   nicotine (NICODERM CQ - DOSED IN MG/24 HOURS) 21 mg/24hr patch Place 1 patch (21 mg total) onto the skin daily. 28 patch 0   ondansetron (ZOFRAN-ODT) 4 MG disintegrating tablet Take 1 tablet (4 mg total) by mouth every 8 (eight) hours as needed for nausea or vomiting. 20 tablet 0   sodium phosphate (FLEET) 7-19 GM/118ML ENEM Place 133 mLs (1 enema total) rectally daily as needed for severe constipation. 133 mL 0   tetrahydrozoline-zinc (VISINE-AC) 0.05-0.25 % ophthalmic solution Place 1-2 drops into both eyes 3 (three) times daily as needed (dry/irritated eyes.).     torsemide (DEMADEX) 20 MG tablet Take 1 tablet (20 mg total) by mouth as needed. 90 tablet 3   nystatin cream (MYCOSTATIN) Apply 1 application topically 2 (two) times daily. 30 g 0   guaiFENesin (MUCINEX) 600 MG 12 hr tablet Take 600 mg by mouth 2 (two) times daily as needed for cough. (Patient not taking: Reported on 03/13/2021)     spironolactone (ALDACTONE) 25 MG tablet Take 1/2 tablet (12.5 mg total) by mouth daily. (Patient not taking: Reported on 03/13/2021) 90 tablet 2   No facility-administered medications prior to visit.    No Known Allergies  ROS Review of Systems  Constitutional: Negative.   HENT:  Positive for congestion, rhinorrhea, sinus pressure and sinus pain. Negative for sore throat.   Respiratory: Negative.    Cardiovascular: Negative.   Skin:  Positive for rash.       Bilateral groin  Psychiatric/Behavioral: Negative.       Objective:    Physical Exam Constitutional:      Appearance: Normal appearance.  HENT:     Right Ear: Tympanic membrane, ear canal and external ear normal.     Left Ear: There is impacted cerumen.     Nose:     Comments: Sinus tenderness on palpation    Mouth/Throat:     Mouth: Mucous membranes are moist.     Pharynx: Oropharyngeal exudate  present.  Cardiovascular:     Rate and Rhythm: Normal rate and regular rhythm.     Pulses: Normal pulses.     Heart sounds: Normal heart sounds.  Pulmonary:     Effort: Pulmonary effort is normal.     Breath sounds: Normal breath sounds.  Musculoskeletal:     Cervical back: Normal range of motion and neck supple.  Skin:    Findings: Rash present.     Comments: To bilateral groin  Neurological:  Mental Status: He is alert.    BP 107/71 (BP Location: Left Arm, Patient Position: Sitting, Cuff Size: Normal)   Pulse 73   Temp 97.8 F (36.6 C) (Oral)   Ht '5\' 10"'  (1.778 m)   Wt 154 lb (69.9 kg)   SpO2 94%   BMI 22.10 kg/m  Wt Readings from Last 3 Encounters:  03/13/21 154 lb (69.9 kg)  02/21/21 161 lb (73 kg)  01/29/21 160 lb (72.6 kg)     Health Maintenance Due  Topic Date Due   INFLUENZA VACCINE  03/12/2021    There are no preventive care reminders to display for this patient.  Lab Results  Component Value Date   TSH 1.843 11/05/2020   Lab Results  Component Value Date   WBC 10.1 01/23/2021   HGB 14.6 01/29/2021   HCT 43.0 01/29/2021   MCV 100.4 (H) 01/23/2021   PLT 336 01/23/2021   Lab Results  Component Value Date   NA 137 01/29/2021   K 4.3 01/29/2021   CO2 32 01/23/2021   GLUCOSE 91 01/23/2021   BUN 9 01/23/2021   CREATININE 0.63 (L) 01/23/2021   BILITOT 0.5 12/19/2020   ALKPHOS 152 (H) 12/19/2020   AST 19 12/19/2020   ALT 18 12/19/2020   PROT 7.8 12/19/2020   ALBUMIN 4.3 12/19/2020   CALCIUM 9.6 01/23/2021   ANIONGAP 10 11/14/2020   EGFR 105 12/19/2020   Lab Results  Component Value Date   CHOL 175 12/19/2020   Lab Results  Component Value Date   HDL 69 12/19/2020   Lab Results  Component Value Date   LDLCALC 93 12/19/2020   Lab Results  Component Value Date   TRIG 68 12/19/2020   No results found for: CHOLHDL Lab Results  Component Value Date   HGBA1C 6.3 (H) 12/19/2020      Assessment & Plan:   Problem List Items  Addressed This Visit       Cardiovascular and Mediastinum   Congestive heart failure (CHF) (HCC)    -hx of a-fib, but rhythm sounds regular today -had heart cath earlier this year and coronary arteries are patent       Relevant Orders   CBC with Differential/Platelet   CMP14+EGFR   Lipid Panel With LDL/HDL Ratio   A-fib ([place])    -uses eliquis for anticoagulation -bisoprolol for rate control         Respiratory   Emphysema/COPD ([place])    -doing well -no change to meds         Digestive   Cirrhosis (HCC)    -checking LFTs with labs       Relevant Orders   CBC with Differential/Platelet   CMP14+EGFR     Musculoskeletal and Integument   Tinea cruris    -has been improving with leftover ketoconazole cream -refill ketoconazole cream       Relevant Medications   ketoconazole (NIZORAL) 2 % cream     Genitourinary   AKI (acute kidney injury) ([place])    -rechecking renal function with labs       Other Visit Diagnoses     Left ear impacted cerumen    -  Primary   Relevant Medications   carbamide peroxide (DEBROX) 6.5 % OTIC solution   Acute non-recurrent maxillary sinusitis       Relevant Medications   amoxicillin-clavulanate (AUGMENTIN) 875-125 MG tablet       Meds ordered this encounter  Medications   carbamide peroxide (DEBROX) 6.5 % OTIC  solution    Sig: Place 5 drops into the left ear 2 (two) times daily.    Dispense:  15 mL    Refill:  0   amoxicillin-clavulanate (AUGMENTIN) 875-125 MG tablet    Sig: Take 1 tablet by mouth 2 (two) times daily.    Dispense:  14 tablet    Refill:  0   ketoconazole (NIZORAL) 2 % cream    Follow-up: Return in about 1 week (around 03/20/2021) for Cerumen removal (left ear).    Noreene Larsson, NP

## 2021-03-13 NOTE — Assessment & Plan Note (Signed)
-  rechecking renal function with labs

## 2021-03-13 NOTE — Assessment & Plan Note (Signed)
-  has been improving with leftover ketoconazole cream -refill ketoconazole cream

## 2021-03-13 NOTE — Assessment & Plan Note (Signed)
-  checking LFTs with labs

## 2021-03-13 NOTE — Assessment & Plan Note (Signed)
-  uses eliquis for anticoagulation -bisoprolol for rate control

## 2021-03-13 NOTE — Assessment & Plan Note (Signed)
-  hx of a-fib, but rhythm sounds regular today -had heart cath earlier this year and coronary arteries are patent

## 2021-03-13 NOTE — Patient Instructions (Signed)
Please have labs drawn in 2 weeks (after 03/21/21).

## 2021-03-20 ENCOUNTER — Ambulatory Visit (INDEPENDENT_AMBULATORY_CARE_PROVIDER_SITE_OTHER): Payer: 59 | Admitting: Nurse Practitioner

## 2021-03-20 ENCOUNTER — Other Ambulatory Visit: Payer: Self-pay

## 2021-03-20 ENCOUNTER — Encounter: Payer: Self-pay | Admitting: Nurse Practitioner

## 2021-03-20 DIAGNOSIS — H6122 Impacted cerumen, left ear: Secondary | ICD-10-CM

## 2021-03-20 NOTE — Progress Notes (Signed)
Acute Office Visit  Subjective:    Patient ID: Jordan Kelly, male    DOB: 02-Feb-1961, 60 y.o.   MRN: [id number]  Chief Complaint  Patient presents with   Sinusitis    Follow up. Feels a lot better from last week.     Sinusitis  Patient is in today for left cerumen impaction. We sent in debrox drops at last OV.  At last OV, he was treated for sinusitis, and he improved after taking augmentin.  Past Medical History:  Diagnosis Date   Atrial flutter ([place])    a. s/p TEE/DCCV in 10/2020   COPD (chronic obstructive pulmonary disease) (HCC)    Depression    PFO (patent foramen ovale)    Psoriasis     Past Surgical History:  Procedure Laterality Date   BUBBLE STUDY  11/08/2020   Procedure: BUBBLE STUDY;  Surgeon: Jerline Pain, MD;  Location: [facility];  Service: Cardiovascular;;   CARDIOVERSION N/A 11/08/2020   Procedure: CARDIOVERSION;  Surgeon: Jerline Pain, MD;  Location: [facility];  Service: Cardiovascular;  Laterality: N/A;   HERNIA REPAIR     right inguinal hernia   RIGHT/LEFT HEART CATH AND CORONARY ANGIOGRAPHY N/A 01/29/2021   Procedure: RIGHT/LEFT HEART CATH AND CORONARY ANGIOGRAPHY;  Surgeon: Sherren Mocha, MD;  Location: [facility];  Service: Cardiovascular;  Laterality: N/A;   TEE WITHOUT CARDIOVERSION N/A 11/08/2020   Procedure: TRANSESOPHAGEAL ECHOCARDIOGRAM (TEE);  Surgeon: Jerline Pain, MD;  Location: [facility] END[facility]COPY;  Service: Cardiovascular;  Laterality: N/A;    Family History  Problem Relation Age of Onset   COPD Father     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  Tobacco Use   Smoking status: Every Day    Packs/day: 0.25    Types: Cigarettes   Smokeless tobacco: Never   Tobacco comments:     Smokes 5 cigarettes per day 01/22/21  Vaping Use   Vaping Use: Never used  Substance and Sexual Activity   Alcohol use: Not Currently    Alcohol/week: 14.0 standard drinks    Types: 14 Cans of beer per week    Comment: 2   Drug use: Yes    Frequency: 5.0 times per week    Types: Marijuana    Comment: last smoked 11/03/20   Sexual activity: Not Currently  Other Topics Concern   Not on file  Social History Narrative   Not on file   Social Determinants of Health   Financial Resource Strain: Not on file  Food Insecurity: No Food Insecurity   Worried About Estate manager/land agent of Food in the Last Year: Never true   Ran Out of Food in the Last Year: Never true  Transportation Needs: No Transportation Needs   Lack of Transportation (Medical): No   Lack of Transportation (Non-Medical): No  Physical Activity: Not on file  Stress: Not on file  Social Connections: Not on file  Intimate Partner Violence: Not on file    Outpatient Medications Prior to Visit  Medication Sig Dispense Refill   albuterol (VENTOLIN HFA) 108 (90 Base) MCG/ACT inhaler Inhale 2 puffs into the lungs every 6 (six) hours as needed for wheezing or shortness  of breath. 8 g 5   amoxicillin-clavulanate (AUGMENTIN) 875-125 MG tablet Take 1 tablet by mouth 2 (two) times daily. 14 tablet 0   apixaban (ELIQUIS) 5 MG TABS tablet Take 1 tablet (5 mg total) by mouth 2 (two) times daily. 120 tablet 2   bisacodyl (DULCOLAX) 10 MG suppository Place 1 suppository (10 mg total) rectally as needed for moderate constipation. 12 suppository 0   bisoprolol (ZEBETA) 5 MG tablet Take 1 tablet (5 mg total) by mouth daily. 90 tablet 3   carbamide peroxide (DEBROX) 6.5 % OTIC solution Place 5 drops into the left ear 2 (two) times daily. 15 mL 0   diazePAM (VALIUM PO) Take 2.5 mg by mouth daily.     fluticasone-salmeterol (ADVAIR DISKUS) 250-50 MCG/ACT AEPB Inhale 1 puff into the lungs in the morning and at bedtime. 1 each 0   ketoconazole (NIZORAL)  2 % cream Apply 1 application topically daily. Apply to affected area. 30 g 0   Multiple Vitamin (MULTIVITAMIN WITH MINERALS) TABS tablet Take 1 tablet by mouth daily. 30 tablet 3   nicotine (NICODERM CQ - DOSED IN MG/24 HOURS) 21 mg/24hr patch Place 1 patch (21 mg total) onto the skin daily. 28 patch 0   ondansetron (ZOFRAN-ODT) 4 MG disintegrating tablet Take 1 tablet (4 mg total) by mouth every 8 (eight) hours as needed for nausea or vomiting. 20 tablet 0   sodium phosphate (FLEET) 7-19 GM/118ML ENEM Place 133 mLs (1 enema total) rectally daily as needed for severe constipation. 133 mL 0   tetrahydrozoline-zinc (VISINE-AC) 0.05-0.25 % ophthalmic solution Place 1-2 drops into both eyes 3 (three) times daily as needed (dry/irritated eyes.).     torsemide (DEMADEX) 20 MG tablet Take 1 tablet (20 mg total) by mouth as needed. 90 tablet 3   No facility-administered medications prior to visit.    No Known Allergies  Review of Systems  Constitutional: Negative.   HENT:  Positive for hearing loss.       Objective:    Physical Exam Constitutional:      Appearance: Normal appearance.  HENT:     Right Ear: Tympanic membrane, ear canal and external ear normal.     Left Ear: There is impacted cerumen.  Neurological:     Mental Status: He is alert.    BP 114/78 (BP Location: Left Arm, Patient Position: Sitting, Cuff Size: Large)   Pulse 68   Temp 98.4 F (36.9 C) (Temporal)   Ht '5\' 10"'  (1.778 m)   Wt 157 lb (71.2 kg)   SpO2 92%   BMI 22.53 kg/m  Wt Readings from Last 3 Encounters:  03/20/21 157 lb (71.2 kg)  03/13/21 154 lb (69.9 kg)  02/21/21 161 lb (73 kg)    Health Maintenance Due  Topic Date Due   INFLUENZA VACCINE  03/12/2021    There are no preventive care reminders to display for this patient.   Lab Results  Component Value Date   TSH 1.843 11/05/2020   Lab Results  Component Value Date   WBC 10.1 01/23/2021   HGB 14.6 01/29/2021   HCT 43.0 01/29/2021   MCV  100.4 (H) 01/23/2021   PLT 336 01/23/2021   Lab Results  Component Value Date   NA 137 01/29/2021   K 4.3 01/29/2021   CO2 32 01/23/2021   GLUCOSE 91 01/23/2021   BUN 9 01/23/2021   CREATININE 0.63 (L) 01/23/2021   BILITOT 0.5 12/19/2020   ALKPHOS 152 (H) 12/19/2020  AST 19 12/19/2020   ALT 18 12/19/2020   PROT 7.8 12/19/2020   ALBUMIN 4.3 12/19/2020   CALCIUM 9.6 01/23/2021   ANIONGAP 10 11/14/2020   EGFR 105 12/19/2020   Lab Results  Component Value Date   CHOL 175 12/19/2020   Lab Results  Component Value Date   HDL 69 12/19/2020   Lab Results  Component Value Date   LDLCALC 93 12/19/2020   Lab Results  Component Value Date   TRIG 68 12/19/2020   No results found for: CHOLHDL Lab Results  Component Value Date   HGBA1C 6.3 (H) 12/19/2020       Assessment & Plan:   Problem List Items Addressed This Visit   None    No orders of the defined types were placed in this encounter.    Noreene Larsson, NP

## 2021-03-20 NOTE — Assessment & Plan Note (Signed)
-  he has been using debrox since last OV -irrigated today -after irrigation, TMs and bony landmarks visible

## 2021-04-02 ENCOUNTER — Encounter: Payer: Self-pay | Admitting: Pulmonary Disease

## 2021-04-02 ENCOUNTER — Ambulatory Visit (INDEPENDENT_AMBULATORY_CARE_PROVIDER_SITE_OTHER): Payer: 59 | Admitting: Pulmonary Disease

## 2021-04-02 ENCOUNTER — Other Ambulatory Visit: Payer: Self-pay

## 2021-04-02 VITALS — BP 118/60 | HR 65 | Temp 97.6°F | Ht 70.0 in | Wt 157.0 lb

## 2021-04-02 DIAGNOSIS — R059 Cough, unspecified: Secondary | ICD-10-CM | POA: Diagnosis not present

## 2021-04-02 DIAGNOSIS — Z720 Tobacco use: Secondary | ICD-10-CM

## 2021-04-02 NOTE — Patient Instructions (Signed)
Follow up in 6 months 

## 2021-04-02 NOTE — Progress Notes (Signed)
[facility]  Chief Complaint  Patient presents with   Follow-up    Patient did not have sleep study done per [facility] was told he didn't want to do it. Patient states that he is feeling a lot better since he had heart cath about 6 weeks ago. Has more energy and most of his symptoms have resolved.    Constitutional:  BP 118/60 (BP Location: Left Arm, Patient Position: Sitting, Cuff Size: Normal)   Pulse 65   Temp 97.6 F (36.4 C) (Oral)   Ht '5\' 10"'$  (1.778 m)   Wt 157 lb (71.2 kg)   SpO2 95%   BMI 22.53 kg/m   Past Medical History:  A flutter, Depression, PFO, CHF, Psoriasis, ETOH with cirrhosis  Past Surgical History:  He  has a past surgical history that includes Hernia repair; TEE without cardioversion (N/A, 11/08/2020); Cardioversion (N/A, 11/08/2020); Bubble study (11/08/2020); and RIGHT/LEFT HEART CATH AND CORONARY ANGIOGRAPHY (N/A, 01/29/2021).  Brief Summary:  Jordan Kelly is a 60 y.o. male smoker with cough.       Subjective:   CXR from June showed clearing of right effusion.  Sleep study not done yet.  He feels he is doing much better and not having the same issues with his sleep like he did before.  Down to 5 cigarettes per day with plan to quit soon.    Advair helps.  Not needing to use albuterol much.  Not having cough, wheeze, sputum, or chest congestion.   Physical Exam:   Appearance - well kempt   ENMT - no sinus tenderness, no oral exudate, no LAN, Mallampati 3 airway, no stridor  Respiratory - equal breath sounds bilaterally, no wheezing or rales  CV - s1s2 regular rate and rhythm, no murmurs  Ext - no clubbing, no edema  Skin - no rashes  Psych - normal mood and affect    Pulmonary testing:    Chest Imaging:  CT angio chest 11/13/20 >>mod Rt effusion, small Lt effusion, mild emphysema, remote rib fractures  Sleep Tests:    Cardiac Tests:  Echo 11/05/20 >> EF 45 to 50%, mod RV dysfunction, severe  RA dilation  Social History:  He  reports that he has been smoking cigarettes. He has been smoking an average of .25 packs per day. He has never used smokeless tobacco. He reports that he does not currently use alcohol after a past usage of about 14.0 standard drinks per week. He reports current drug use. Frequency: 5.00 times per week. Drug: Marijuana.  Family History:  His family history includes COPD in his father.     Assessment/Plan:   Cough with emphysema and history of tobacco abuse. - he might have component of asthma and allergies also - spiriva was too expensive - continue advair with prn albuterol - he would like to defer pulmonary function testing for now  Tobacco abuse. - encouraged him to keep up with smoking cessation efforts  Snoring. - he feels his sleep is better and would like to defer sleep testing at this time  A flutter, chronic systolic CHF, PFO. - followed by [facility]  ETOH with cirrhosis. - he has appointment scheduled with Dr. Hurshel Keys with Gastroenterology  Time Spent Involved in Patient Care on Day of Examination:  23 minutes  Follow up:   Patient Instructions  Follow up in 6 months  Medication List:   Allergies as of 04/02/2021   No Known Allergies  Medication List        Accurate as of April 02, 2021 10:43 AM. If you have any questions, ask your nurse or doctor.          STOP taking these medications    amoxicillin-clavulanate 875-125 MG tablet Commonly known as: AUGMENTIN Stopped by: Jordan Mires, MD       TAKE these medications    albuterol 108 (90 Base) MCG/ACT inhaler Commonly known as: VENTOLIN HFA Inhale 2 puffs into the lungs every 6 (six) hours as needed for wheezing or shortness of breath.   apixaban 5 MG Tabs tablet Commonly known as: ELIQUIS Take 1 tablet (5 mg total) by mouth 2 (two) times daily.   bisacodyl 10 MG suppository Commonly known as: DULCOLAX Place 1 suppository (10 mg  total) rectally as needed for moderate constipation.   bisoprolol 5 MG tablet Commonly known as: ZEBETA Take 1 tablet (5 mg total) by mouth daily.   Debrox 6.5 % OTIC solution Generic drug: carbamide peroxide Place 5 drops into the left ear 2 (two) times daily.   fluticasone-salmeterol 250-50 MCG/ACT Aepb Commonly known as: Advair Diskus Inhale 1 puff into the lungs in the morning and at bedtime.   ketoconazole 2 % cream Commonly known as: NIZORAL Apply 1 application topically daily. Apply to affected area.   multivitamin with minerals Tabs tablet Take 1 tablet by mouth daily.   nicotine 21 mg/24hr patch Commonly known as: NICODERM CQ - dosed in mg/24 hours Place 1 patch (21 mg total) onto the skin daily.   ondansetron 4 MG disintegrating tablet Commonly known as: ZOFRAN-ODT Take 1 tablet (4 mg total) by mouth every 8 (eight) hours as needed for nausea or vomiting.   sodium phosphate 7-19 GM/118ML Enem Place 133 mLs (1 enema total) rectally daily as needed for severe constipation.   tetrahydrozoline-zinc 0.05-0.25 % ophthalmic solution Commonly known as: VISINE-AC Place 1-2 drops into both eyes 3 (three) times daily as needed (dry/irritated eyes.).   torsemide 20 MG tablet Commonly known as: DEMADEX Take 1 tablet (20 mg total) by mouth as needed.   VALIUM PO Take 2.5 mg by mouth daily.        Signature:  Jordan Mires, MD [place] Pager - [phone] 04/02/2021, 10:43 AM

## 2021-04-04 ENCOUNTER — Other Ambulatory Visit: Payer: Self-pay | Admitting: *Deleted

## 2021-04-04 ENCOUNTER — Ambulatory Visit (INDEPENDENT_AMBULATORY_CARE_PROVIDER_SITE_OTHER): Payer: 59 | Admitting: Internal Medicine

## 2021-04-04 ENCOUNTER — Encounter: Payer: Self-pay | Admitting: *Deleted

## 2021-04-04 ENCOUNTER — Other Ambulatory Visit: Payer: Self-pay

## 2021-04-04 ENCOUNTER — Encounter: Payer: Self-pay | Admitting: Internal Medicine

## 2021-04-04 VITALS — BP 118/74 | HR 68 | Temp 97.5°F | Ht 70.0 in | Wt 156.6 lb

## 2021-04-04 DIAGNOSIS — K7030 Alcoholic cirrhosis of liver without ascites: Secondary | ICD-10-CM

## 2021-04-04 DIAGNOSIS — Z1211 Encounter for screening for malignant neoplasm of colon: Secondary | ICD-10-CM

## 2021-04-04 NOTE — Patient Instructions (Signed)
I will order ultrasound with elastography to further evaluate your liver.  You will need to go to [facility] to complete blood work ordered by Demetrius Revel.  I will add on a few lab tests of my own as well.  You are due for colonoscopy for colon cancer screening purposes.  We will hold off for now and discuss on follow-up visit.  Follow-up in 6 to 8 weeks.  It was nice meeting you today.  Dr. Abbey Chatters  At [facility] we value your feedback. You may receive a survey about your visit today. Please share your experience as we strive to create trusting relationships with our patients to provide genuine, compassionate, quality care.  We appreciate your understanding and patience as we review any laboratory studies, imaging, and other diagnostic tests that are ordered as we care for you. Our office policy is 5 business days for review of these results, and any emergent or urgent results are addressed in a timely manner for your best interest. If you do not hear from our office in 1 week, please contact us.   We also encourage the use of MyChart, which contains your medical information for your review as well. If you are not enrolled in this feature, an access code is on this after visit summary for your convenience. Thank you for allowing [place] to be involved in your care.  It was great to see you today!  I hope you have a great rest of your summer!!    Jordan Kelly. Abbey Chatters, D.O. Gastroenterology and Hepatology [facility]

## 2021-04-04 NOTE — Progress Notes (Signed)
Primary Care Physician:  Noreene Larsson, NP Primary Gastroenterologist:  Dr. Abbey Chatters  Chief Complaint  Patient presents with   abnormal ultrasound    HPI:   Jordan Kelly is a 60 y.o. male who presents to the clinic today by referral from his PCP Jordan Kelly for evaluation.  Patient was hospitalized late March for acute right-sided heart failure as well as new onset atrial flutter.  He was in the hospital for over a week at which time he had an ultrasound that showed possible cirrhosis.  Also with noted elevated liver enzymes during his admission.  He has a history of heavy alcohol use.  States he was drinking more than 6 beers a day for over 20 years until this hospitalization.  States he is no longer drinking currently.  Also notes his sister had liver disease.  Denies any history of viral hepatitis.  Viral hepatitis panel negative during hospital admission.  Today he states he is doing well.  No abdominal pain.  No swelling in his legs or abdomen.  No confusion or fatigue.  No melena hematochezia.  No previous upper endoscopy or colonoscopy.  Past Medical History:  Diagnosis Date   Atrial flutter ([place])    a. s/p TEE/DCCV in 10/2020   COPD (chronic obstructive pulmonary disease) (HCC)    Depression    PFO (patent foramen ovale)    Psoriasis     Past Surgical History:  Procedure Laterality Date   BUBBLE STUDY  11/08/2020   Procedure: BUBBLE STUDY;  Surgeon: Jerline Pain, MD;  Location: [facility];  Service: Cardiovascular;;   CARDIOVERSION N/A 11/08/2020   Procedure: CARDIOVERSION;  Surgeon: Jerline Pain, MD;  Location: [facility];  Service: Cardiovascular;  Laterality: N/A;   HERNIA REPAIR     right inguinal hernia   RIGHT/LEFT HEART CATH AND CORONARY ANGIOGRAPHY N/A 01/29/2021   Procedure: RIGHT/LEFT HEART CATH AND CORONARY ANGIOGRAPHY;  Surgeon: Sherren Mocha, MD;  Location: [facility];  Service: Cardiovascular;  Laterality: N/A;   TEE WITHOUT CARDIOVERSION  N/A 11/08/2020   Procedure: TRANSESOPHAGEAL ECHOCARDIOGRAM (TEE);  Surgeon: Jerline Pain, MD;  Location: [facility] ENDOSCOPY;  Service: Cardiovascular;  Laterality: N/A;    Current Outpatient Medications  Medication Sig Dispense Refill   albuterol (VENTOLIN HFA) 108 (90 Base) MCG/ACT inhaler Inhale 2 puffs into the lungs every 6 (six) hours as needed for wheezing or shortness of breath. 8 g 5   apixaban (ELIQUIS) 5 MG TABS tablet Take 1 tablet (5 mg total) by mouth 2 (two) times daily. 120 tablet 2   bisacodyl (DULCOLAX) 10 MG suppository Place 1 suppository (10 mg total) rectally as needed for moderate constipation. 12 suppository 0   bisoprolol (ZEBETA) 5 MG tablet Take 1 tablet (5 mg total) by mouth daily. 90 tablet 3   carbamide peroxide (DEBROX) 6.5 % OTIC solution Place 5 drops into the left ear 2 (two) times daily. 15 mL 0   diazePAM (VALIUM PO) Take 2.5 mg by mouth daily.     fluticasone-salmeterol (ADVAIR DISKUS) 250-50 MCG/ACT AEPB Inhale 1 puff into the lungs in the morning and at bedtime. 1 each 0   ketoconazole (NIZORAL) 2 % cream Apply 1 application topically daily. Apply to affected area. 30 g 0   Multiple Vitamin (MULTIVITAMIN WITH MINERALS) TABS tablet Take 1 tablet by mouth daily. 30 tablet 3   nicotine (NICODERM CQ - DOSED IN MG/24 HOURS) 21 mg/24hr patch Place 1 patch (21 mg total) onto  the skin daily. 28 patch 0   ondansetron (ZOFRAN-ODT) 4 MG disintegrating tablet Take 1 tablet (4 mg total) by mouth every 8 (eight) hours as needed for nausea or vomiting. 20 tablet 0   sodium phosphate (FLEET) 7-19 GM/118ML ENEM Place 133 mLs (1 enema total) rectally daily as needed for severe constipation. 133 mL 0   tetrahydrozoline-zinc (VISINE-AC) 0.05-0.25 % ophthalmic solution Place 1-2 drops into both eyes 3 (three) times daily as needed (dry/irritated eyes.).     torsemide (DEMADEX) 20 MG tablet Take 1 tablet (20 mg total) by mouth as needed. 90 tablet 3   No current facility-administered  medications for this visit.    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  Tobacco Use   Smoking status: Every Day    Packs/day: 0.25    Types: Cigarettes   Smokeless tobacco: Never   Tobacco comments:    Smokes 5 cigarettes per day 04/02/21  Vaping Use   Vaping Use: Never used  Substance and Sexual Activity   Alcohol use: Not Currently    Comment: 2 beers per week   Drug use: Yes    Types: Marijuana    Comment: daily   Sexual activity: Not Currently  Other Topics Concern   Not on file  Social History Narrative   Not on file   Social Determinants of Health   Financial Resource Strain: Not on file  Food Insecurity: No Food Insecurity   Worried About Running Out of Food in the Last Year: Never true   Ran Out of Food in the Last Year: Never true  Transportation Needs: No Transportation Needs   Lack of Transportation (Medical): No   Lack of Transportation (Non-Medical): No  Physical Activity: Not on file  Stress: Not on file  Social Connections: Not on file  Intimate Partner Violence: Not on file    Subjective: Review of Systems  Constitutional:  Negative for chills and fever.  HENT:  Negative for congestion and hearing loss.   Eyes:  Negative for blurred vision and double vision.  Respiratory:  Negative for cough and shortness of breath.   Cardiovascular:  Negative for chest pain and palpitations.  Gastrointestinal:  Negative for abdominal pain, blood in stool, constipation, diarrhea, heartburn, melena and vomiting.  Genitourinary:  Negative for dysuria and urgency.  Musculoskeletal:  Negative for joint pain  and myalgias.  Skin:  Negative for itching and rash.  Neurological:  Negative for dizziness and headaches.  Psychiatric/Behavioral:  Negative for depression. The patient is not nervous/anxious.       Objective: BP 118/74   Pulse 68   Temp (!) 97.5 F (36.4 C)   Ht '5\' 10"'$  (1.778 m)   Wt 156 lb 9.6 oz (71 kg)   BMI 22.47 kg/m  Physical Exam Constitutional:      Appearance: Normal appearance.  HENT:     Head: Normocephalic and atraumatic.  Eyes:     Extraocular Movements: Extraocular movements intact.     Conjunctiva/sclera: Conjunctivae normal.  Cardiovascular:  Rate and Rhythm: Normal rate and regular rhythm.  Pulmonary:     Effort: Pulmonary effort is normal.     Breath sounds: Normal breath sounds.  Abdominal:     General: Bowel sounds are normal.     Palpations: Abdomen is soft.  Musculoskeletal:        General: Normal range of motion.     Cervical back: Normal range of motion and neck supple.  Skin:    General: Skin is warm.  Neurological:     General: No focal deficit present.     Mental Status: He is alert and oriented to person, place, and time.  Psychiatric:        Mood and Affect: Mood normal.        Behavior: Behavior normal.     Assessment: *Abnormal liver function test *Cirrhosis *Chronic alcohol abuse *Colon cancer screening  Plan: Discussed cirrhosis with patient today.  His ultrasound showed questionable microlobulated hepatic contours which can be seen with cirrhosis.  I will order ultrasound with elastography to confirm his diagnosis.    Patient has CBC and CMP ordered by his PCP.  I will add on INR as well as alpha-fetoprotein.  If elastography confirms cirrhosis or aminotransferases still abnormal, will likely need full serological work-up.  Patient is due for colonoscopy for colon cancer screening purposes.  I will hold off on scheduling his for now as if he does have cirrhosis he will likely need EGD for variceal screening at the same  time.  We will discuss further on follow-up visit.  Patient follow-up in 6 to 8 weeks.  Thank you Jordan Kelly for the kind referral.  04/04/2021 3:59 PM   Disclaimer: This note was dictated with voice recognition software. Similar sounding words can inadvertently be transcribed and may not be corrected upon review.

## 2021-04-06 LAB — CBC WITH DIFFERENTIAL/PLATELET
Basophils Absolute: 0.1 10*3/uL (ref 0.0–0.2)
Basos: 1 %
EOS (ABSOLUTE): 0.3 10*3/uL (ref 0.0–0.4)
Eos: 3 %
Hematocrit: 48.3 % (ref 37.5–51.0)
Hemoglobin: 17.2 g/dL (ref 13.0–17.7)
Immature Grans (Abs): 0 10*3/uL (ref 0.0–0.1)
Immature Granulocytes: 0 %
Lymphocytes Absolute: 2 10*3/uL (ref 0.7–3.1)
Lymphs: 24 %
MCH: 35.9 pg — ABNORMAL HIGH (ref 26.6–33.0)
MCHC: 35.6 g/dL (ref 31.5–35.7)
MCV: 101 fL — ABNORMAL HIGH (ref 79–97)
Monocytes Absolute: 0.8 10*3/uL (ref 0.1–0.9)
Monocytes: 9 %
Neutrophils Absolute: 5.2 10*3/uL (ref 1.4–7.0)
Neutrophils: 63 %
Platelets: 306 10*3/uL (ref 150–450)
RBC: 4.79 x10E6/uL (ref 4.14–5.80)
RDW: 10.8 % — ABNORMAL LOW (ref 11.6–15.4)
WBC: 8.3 10*3/uL (ref 3.4–10.8)

## 2021-04-06 LAB — CMP14+EGFR
ALT: 16 IU/L (ref 0–44)
AST: 19 IU/L (ref 0–40)
Albumin/Globulin Ratio: 1.4 (ref 1.2–2.2)
Albumin: 4.3 g/dL (ref 3.8–4.9)
Alkaline Phosphatase: 127 IU/L — ABNORMAL HIGH (ref 44–121)
BUN/Creatinine Ratio: 12 (ref 9–20)
BUN: 8 mg/dL (ref 6–24)
Bilirubin Total: 0.6 mg/dL (ref 0.0–1.2)
CO2: 32 mmol/L — ABNORMAL HIGH (ref 20–29)
Calcium: 9.7 mg/dL (ref 8.7–10.2)
Chloride: 94 mmol/L — ABNORMAL LOW (ref 96–106)
Creatinine, Ser: 0.69 mg/dL — ABNORMAL LOW (ref 0.76–1.27)
Globulin, Total: 3.1 g/dL (ref 1.5–4.5)
Glucose: 105 mg/dL — ABNORMAL HIGH (ref 65–99)
Potassium: 4.2 mmol/L (ref 3.5–5.2)
Sodium: 136 mmol/L (ref 134–144)
Total Protein: 7.4 g/dL (ref 6.0–8.5)
eGFR: 107 mL/min/{1.73_m2} (ref >59)

## 2021-04-06 LAB — LIPID PANEL WITH LDL/HDL RATIO
Cholesterol, Total: 153 mg/dL (ref 100–199)
HDL: 75 mg/dL (ref >39)
LDL Chol Calc (NIH): 68 mg/dL (ref 0–99)
LDL/HDL Ratio: 0.9 ratio (ref 0.0–3.6)
Triglycerides: 46 mg/dL (ref 0–149)
VLDL Cholesterol Cal: 10 mg/dL (ref 5–40)

## 2021-04-06 LAB — AFP TUMOR MARKER: AFP, Serum, Tumor Marker: 2.2 ng/mL (ref 0.0–8.4)

## 2021-04-06 LAB — PROTIME-INR
INR: 1 (ref 0.9–1.2)
Prothrombin Time: 10.8 s (ref 9.1–12.0)

## 2021-04-06 NOTE — Progress Notes (Signed)
OVerall, labs look good. I added and A1c since blood sugar was a little elevated.

## 2021-04-10 ENCOUNTER — Telehealth: Payer: 59

## 2021-04-10 ENCOUNTER — Telehealth: Payer: Self-pay | Admitting: *Deleted

## 2021-04-10 LAB — SPECIMEN STATUS REPORT

## 2021-04-10 LAB — HEMOGLOBIN A1C
Est. average glucose Bld gHb Est-mCnc: 105 mg/dL
Hgb A1c MFr Bld: 5.3 % (ref 4.8–5.6)

## 2021-04-10 NOTE — Progress Notes (Signed)
A1c looks great at 5.3%.

## 2021-04-10 NOTE — Telephone Encounter (Signed)
  Care Management   Follow Up Note   04/10/2021 Name: DILLON HILLIE MRN: [id number] DOB: Nov 20, 1960   Referred by: Noreene Larsson, NP Reason for referral : Care Coordination (CHF, cirrhosis)   An unsuccessful telephone outreach was attempted today. The patient was referred to the case management team for assistance with care management and care coordination.   Follow Up Plan: Telephone follow up appointment with care management team member scheduled for:  upon care guide rescheduling.  Jacqlyn Larsen [facility], BSN RN Case Manager [facility] [phone]

## 2021-04-11 ENCOUNTER — Encounter: Payer: Self-pay | Admitting: Internal Medicine

## 2021-04-12 ENCOUNTER — Ambulatory Visit (HOSPITAL_COMMUNITY): Payer: 59

## 2021-04-17 ENCOUNTER — Other Ambulatory Visit: Payer: Self-pay

## 2021-04-17 ENCOUNTER — Ambulatory Visit (HOSPITAL_COMMUNITY)
Admission: RE | Admit: 2021-04-17 | Discharge: 2021-04-17 | Disposition: A | Discharge status: Home / Self Care | Payer: 59 | Source: Ambulatory Visit | Attending: Internal Medicine | Admitting: Internal Medicine

## 2021-04-17 DIAGNOSIS — K7030 Alcoholic cirrhosis of liver without ascites: Secondary | ICD-10-CM | POA: Diagnosis not present

## 2021-05-01 ENCOUNTER — Ambulatory Visit: Payer: 59 | Admitting: *Deleted

## 2021-05-01 DIAGNOSIS — I5082 Biventricular heart failure: Secondary | ICD-10-CM

## 2021-05-01 DIAGNOSIS — K7030 Alcoholic cirrhosis of liver without ascites: Secondary | ICD-10-CM

## 2021-05-01 NOTE — Patient Instructions (Signed)
Visit Information   Goals Addressed             This Visit's Progress    Maintain My Quality of Life related to managment of cirrhosis       Timeframe:  Long-Range Goal Priority:  High Start Date:      01/04/2021                       Expected End Date:    10/29/2021              Follow Up Date 06/26/2021   - continue to discuss treatment options with the doctor or nurse and make shared treatment decisions - continue spending time outdoors at least 3 times a week - continue walking and lifting light weights, walking your dog- keep up the good work - take medications as prescribed, call if any further difficulty obtaining eliquis - do what you can and alternate activity with rest- energy conservation - continue to avoid any products containing tylenol  - continue working on quitting smoking Why is this important?   Having a long-term illness can be scary.  It can also be stressful for you and your caregiver.  These steps may help.    Notes:      Track and Manage Symptoms-Heart Failure       Timeframe:  Long-Range Goal Priority:  High Start Date:         11/30/2020                    Expected End Date:    10/29/2021                  Follow Up Date 06/26/2021   - continue to follow heart failure rescue plan, weigh daily and record - eat more whole grains, fruits and vegetables, lean meats and healthy fats - follow rescue plan if symptoms flare-up- be mindful of how you feel each day - know when to call the doctor, call early for change in health status/ symptoms - call doctor if have 3 pound weight gain in one day or 5 pounds in one week, increased swelling - follow up with primary care provider as needed - call if you have any difficulty affording medications    Why is this important?   You will be able to handle your symptoms better if you keep track of them.  Making some simple changes to your lifestyle will help.  Eating healthy is one thing you can do to take good care  of yourself.    Notes:         The patient verbalized understanding of instructions, educational materials, and care plan provided today and declined offer to receive copy of patient instructions, educational materials, and care plan.   Telephone follow up appointment with care management team member scheduled for:  06/26/2021  Jacqlyn Larsen [facility], BSN RN Case Manager [facility] [phone]

## 2021-05-01 NOTE — Chronic Care Management (AMB) (Signed)
Care Management    RN Visit Note  05/01/2021 Name: Jordan Kelly MRN: [id number] DOB: September 05, 1960  Subjective: Jordan Kelly is a 60 y.o. year old male who is a primary care patient of Noreene Larsson, NP. The care management team was consulted for assistance with disease management and care coordination needs.    Engaged with patient by telephone for follow up visit in response to provider referral for case management and/or care coordination services.   Consent to Services:   Jordan Kelly was given information about Care Management services today including:  Care Management services includes personalized support from designated clinical staff supervised by his physician, including individualized plan of care and coordination with other care providers 24/7 contact phone numbers for assistance for urgent and routine care needs. The patient may stop case management services at any time by phone call to the office staff.  Patient agreed to services and consent obtained.   Assessment: Review of patient past medical history, allergies, medications, health status, including review of consultants reports, laboratory and other test data, was performed as part of comprehensive evaluation and provision of chronic care management services.   SDOH (Social Determinants of Health) assessments and interventions performed:    Care Plan  No Known Allergies  Outpatient Encounter Medications as of 05/01/2021  Medication Sig   albuterol (VENTOLIN HFA) 108 (90 Base) MCG/ACT inhaler Inhale 2 puffs into the lungs every 6 (six) hours as needed for wheezing or shortness of breath.   apixaban (ELIQUIS) 5 MG TABS tablet Take 1 tablet (5 mg total) by mouth 2 (two) times daily.   bisacodyl (DULCOLAX) 10 MG suppository Place 1 suppository (10 mg total) rectally as needed for moderate constipation.   bisoprolol (ZEBETA) 5 MG tablet Take 1 tablet (5 mg total) by mouth daily.   carbamide peroxide (DEBROX) 6.5 % OTIC  solution Place 5 drops into the left ear 2 (two) times daily.   diazePAM (VALIUM PO) Take 2.5 mg by mouth daily.   fluticasone-salmeterol (ADVAIR DISKUS) 250-50 MCG/ACT AEPB Inhale 1 puff into the lungs in the morning and at bedtime.   ketoconazole (NIZORAL) 2 % cream Apply 1 application topically daily. Apply to affected area.   Multiple Vitamin (MULTIVITAMIN WITH MINERALS) TABS tablet Take 1 tablet by mouth daily.   nicotine (NICODERM CQ - DOSED IN MG/24 HOURS) 21 mg/24hr patch Place 1 patch (21 mg total) onto the skin daily.   tetrahydrozoline-zinc (VISINE-AC) 0.05-0.25 % ophthalmic solution Place 1-2 drops into both eyes 3 (three) times daily as needed (dry/irritated eyes.).   torsemide (DEMADEX) 20 MG tablet Take 1 tablet (20 mg total) by mouth as needed.   ondansetron (ZOFRAN-ODT) 4 MG disintegrating tablet Take 1 tablet (4 mg total) by mouth every 8 (eight) hours as needed for nausea or vomiting. (Patient not taking: Reported on 05/01/2021)   sodium phosphate (FLEET) 7-19 GM/118ML ENEM Place 133 mLs (1 enema total) rectally daily as needed for severe constipation. (Patient not taking: Reported on 05/01/2021)   No facility-administered encounter medications on file as of 05/01/2021.    Patient Active Problem List   Diagnosis Date Noted   Left ear impacted cerumen 03/20/2021   Tinea cruris 03/13/2021   A-fib ([place]) 03/13/2021   Abnormal liver diagnostic imaging 12/12/2020   Pulmonary hypertension, primary ([place]) 11/28/2020   PFO (patent foramen ovale) 11/14/2020   Cirrhosis ([place]) 11/14/2020   Psoriasis 11/14/2020   AKI (acute kidney injury) ([place]) 11/14/2020   CHF (congestive heart failure) (  [place]) 11/05/2020   Congestive heart failure (CHF) ([place]) 11/04/2020   Encounter to establish care 10/30/2020   Emphysema/COPD ([place]) 10/30/2020   Constipation 10/30/2020   Nausea 10/30/2020   Tobacco abuse 01/09/2019   Alcohol abuse 01/09/2019    Conditions to be addressed/monitored: CHF,  cirrhosis  Care Plan : Heart Failure (Adult)  Updates made by Kassie Mends, RN since 05/01/2021 12:00 AM     Problem: Symptom Exacerbation (Heart Failure)   Priority: High     Long-Range Goal: Symptom Exacerbation Prevented or Minimized   Start Date: 11/30/2020  Expected End Date: 10/29/2021  This Visit's Progress: On track  Recent Progress: On track  Priority: High  Note:   Current Barriers:  Knowledge deficit related to basic heart failure pathophysiology and self care management- needs reinforcement of CHF action plan Financial strain- patient is back at work 30-35 hours per week at [employer], this is all he is able to do at present due to health issues, pt hopes to increase his hours in the future, continues to exercise daily. Case Manager Clinical Goal(s):  patient will weigh self daily and record patient will verbalize understanding of Heart Failure Action Plan and when to call doctor patient will take all Heart Failure mediations as prescribed patient will weigh daily and record (notifying MD of 3 lb weight gain over night or 5 lb in a week) Interventions:  Collaboration with Noreene Larsson, NP regarding development and update of comprehensive plan of care as evidenced by provider attestation and co-signature Inter-disciplinary care team collaboration (see longitudinal plan of care) Basic overview and discussion of pathophysiology of Heart Failure reinforced Reviewed importance of following low sodium diet Reinforced importance of daily weight and advised patient to weigh and record daily Reinforced role of diuretics in prevention of fluid overload and management of heart failure Reviewed importance of calling doctor for exacerbation of HF symptoms such as weight gain, edema, etc. Reviewed heart failure action plan with emphasis on yellow zone Reminded patient to call RN CM or social worker for any continued financial issues such as medication  affordability Reinforced with patient contact number for Eliquis Savings Card [phone] - pt has the card and has used it, verbalizes understanding Encouraged pt to continue walking outside daily with dog and practicing energy conservation Reviewed all upcoming appointments Self-Care Activities: Self administers medications as prescribed Attends all scheduled provider appointments Attends church or other social activities Calls provider office for new concerns or questions Patient Goals: - continue to follow heart failure rescue plan, weigh daily and record - eat more whole grains, fruits and vegetables, lean meats and healthy fats - follow rescue plan if symptoms flare-up- be mindful of how you feel each day - know when to call the doctor, call early for change in health status/ symptoms - call doctor if have 3 pound weight gain in one day or 5 pounds in one week, increased swelling - follow up with primary care provider as needed - call if you have any difficulty affording medications Follow Up Plan: Telephone follow up appointment with care management team member scheduled for:   06/26/2021    Care Plan : Cirrhosis  Updates made by Kassie Mends, RN since 05/01/2021 12:00 AM     Problem: Health Promotion or Disease Self-Management of Cirrhosis   Priority: High     Long-Range Goal: Self-Management Plan Developed   Start Date: 11/30/2020  Expected End Date: 10/29/2021  This Visit's Progress: On track  Recent  Progress: On track  Priority: High  Note:   Current Barriers:  Ineffective Self Health Maintenance - Does not have HCPOA and living will in place, not interested at present Financial concerns- pt is back at work 30-35 hours per week Rides a moped and sister transports patient to out of town appointments, does not feel he needs to utilize [facility] transportation at present, pt is getting out to walk his dog several times per week, continues using nicoderm patches at times,  still smokes approximately 5 cigarettes per day, had ultrasound (liver) and states good report Has eliquis as is able to afford at present Clinical Goal(s):  Collaboration with Noreene Larsson, NP regarding development and update of comprehensive plan of care as evidenced by provider attestation and co-signature Inter-disciplinary care team collaboration (see longitudinal plan of care) patient will work with care management team to address care coordination and chronic disease management needs related to Disease Management Medication Management and Education Medication Assistance  Mental Health Counseling   Interventions:  Evaluation of current treatment plan related to Cirrhosis, Financial constraints related to currently out of work, no income, plans to start back to work part time next week,  Medication procurement- unable to afford eliquis and Mental Health Concerns  self-management and patient's adherence to plan as established by provider. Collaboration with Noreene Larsson, NP regarding development and update of comprehensive plan of care as evidenced by provider attestation       and co-signature Inter-disciplinary care team collaboration (see longitudinal plan of care) Reviewed plans with patient for ongoing care management follow up and reinforced direct contact information for care management team Reviewed medications with patient importance of taking all medications as prescribed Reinforced pt to call doctor for any new concerns such as fluid retention Reinforced importance of avoiding medications such as tylenol Reinforced healthy diet and continuing to exercise/ walk daily Encouraged complete smoking cessation Self Care Activities:  Attends all scheduled provider appointments Calls pharmacy for medication refills Attends church or other social activities Calls provider office for new concerns or questions Patient Goals:  - continue to discuss treatment options with the doctor or  nurse and make shared treatment decisions - continue spending time outdoors at least 3 times a week - continue walking and lifting light weights, walking your dog- keep up the good work - take medications as prescribed, call if any further difficulty obtaining eliquis - do what you can and alternate activity with rest- energy conservation - continue to avoid any products containing tylenol - continue working on quitting smoking Follow Up Plan:  Telephone follow up appointment with care management team member scheduled for:    06/26/2021     Plan: Telephone follow up appointment with care management team member scheduled for:  06/26/2021  Jacqlyn Larsen [facility], BSN RN Case Manager [facility] [phone]

## 2021-05-23 ENCOUNTER — Ambulatory Visit: Payer: 59 | Admitting: Internal Medicine

## 2021-06-06 ENCOUNTER — Encounter: Payer: Self-pay | Admitting: Internal Medicine

## 2021-06-06 ENCOUNTER — Ambulatory Visit (INDEPENDENT_AMBULATORY_CARE_PROVIDER_SITE_OTHER): Payer: 59 | Admitting: Internal Medicine

## 2021-06-06 ENCOUNTER — Other Ambulatory Visit: Payer: Self-pay

## 2021-06-06 VITALS — BP 116/71 | HR 63 | Temp 97.3°F | Ht 70.0 in | Wt 159.0 lb

## 2021-06-06 DIAGNOSIS — K700 Alcoholic fatty liver: Secondary | ICD-10-CM | POA: Diagnosis not present

## 2021-06-06 DIAGNOSIS — Z1211 Encounter for screening for malignant neoplasm of colon: Secondary | ICD-10-CM

## 2021-06-06 NOTE — Patient Instructions (Signed)
We will schedule you for colonoscopy for colon cancer screening purposes.  You will need to hold your Eliquis x48 hours prior to procedure.  We will reach out to your cardiologist for their blessing in this matter.  Your ultrasound showed fatty liver disease likely related to alcohol.  Would avoid alcohol as best as you can.  We will keep monitoring this.  It was good seeing you again today.  Dr. Abbey Chatters  At [facility] we value your feedback. You may receive a survey about your visit today. Please share your experience as we strive to create trusting relationships with our patients to provide genuine, compassionate, quality care.  We appreciate your understanding and patience as we review any laboratory studies, imaging, and other diagnostic tests that are ordered as we care for you. Our office policy is 5 business days for review of these results, and any emergent or urgent results are addressed in a timely manner for your best interest. If you do not hear from our office in 1 week, please contact us.   We also encourage the use of MyChart, which contains your medical information for your review as well. If you are not enrolled in this feature, an access code is on this after visit summary for your convenience. Thank you for allowing [place] to be involved in your care.  It was great to see you today!  I hope you have a great rest of your Fall!    Jordan Kelly. Abbey Chatters, D.O. Gastroenterology and Hepatology [facility]

## 2021-06-06 NOTE — Progress Notes (Signed)
Primary Care Physician:  Noreene Larsson, NP Primary Gastroenterologist:  Dr. Abbey Chatters  Chief complaint: fatty liver disease, alcohol use  HPI:   Jordan Kelly is a 60 y.o. male who presents to the clinic today for follow-up visit.  Patient was hospitalized late March 2022 for acute right-sided heart failure as well as new onset atrial flutter.  He was in the hospital for over a week at which time he had an ultrasound that showed possible cirrhosis.  Also with noted elevated liver enzymes during his admission.  He has a history of heavy alcohol use.  States he was drinking more than 6 beers a day for over 20 years until this hospitalization.  States currently he is drinking 1-2 beers per week.  I repeated his ultrasound with elastography, showed fatty liver, median K PA of 4.9.  Most recent LFTs WNL.  No previous colonoscopy.  Past Medical History:  Diagnosis Date   Atrial flutter ([place])    a. s/p TEE/DCCV in 10/2020   COPD (chronic obstructive pulmonary disease) ([facility])    Depression    PFO (patent foramen ovale)    Psoriasis     Past Surgical History:  Procedure Laterality Date   BUBBLE STUDY  11/08/2020   Procedure: BUBBLE STUDY;  Surgeon: Jerline Pain, MD;  Location: [place];  Service: Cardiovascular;;   CARDIOVERSION N/A 11/08/2020   Procedure: CARDIOVERSION;  Surgeon: Jerline Pain, MD;  Location: [place];  Service: Cardiovascular;  Laterality: N/A;   HERNIA REPAIR     right inguinal hernia   RIGHT/LEFT HEART CATH AND CORONARY ANGIOGRAPHY N/A 01/29/2021   Procedure: RIGHT/LEFT HEART CATH AND CORONARY ANGIOGRAPHY;  Surgeon: Sherren Mocha, MD;  Location: [place] CV LAB;  Service: Cardiovascular;  Laterality: N/A;   TEE WITHOUT CARDIOVERSION N/A 11/08/2020   Procedure: TRANSESOPHAGEAL ECHOCARDIOGRAM (TEE);  Surgeon: Jerline Pain, MD;  Location: [facility] ENDOSCOPY;  Service: Cardiovascular;  Laterality: N/A;    Current Outpatient Medications  Medication Sig  Dispense Refill   albuterol (VENTOLIN HFA) 108 (90 Base) MCG/ACT inhaler Inhale 2 puffs into the lungs every 6 (six) hours as needed for wheezing or shortness of breath. 8 g 5   apixaban (ELIQUIS) 5 MG TABS tablet Take 1 tablet (5 mg total) by mouth 2 (two) times daily. 120 tablet 2   bisacodyl (DULCOLAX) 10 MG suppository Place 1 suppository (10 mg total) rectally as needed for moderate constipation. 12 suppository 0   bisoprolol (ZEBETA) 5 MG tablet Take 1 tablet (5 mg total) by mouth daily. 90 tablet 3   diazePAM (VALIUM PO) Take 2.5 mg by mouth daily.     fluticasone-salmeterol (ADVAIR DISKUS) 250-50 MCG/ACT AEPB Inhale 1 puff into the lungs in the morning and at bedtime. 1 each 0   Multiple Vitamin (MULTIVITAMIN WITH MINERALS) TABS tablet Take 1 tablet by mouth daily. 30 tablet 3   tetrahydrozoline-zinc (VISINE-AC) 0.05-0.25 % ophthalmic solution Place 1-2 drops into both eyes 3 (three) times daily as needed (dry/irritated eyes.).     torsemide (DEMADEX) 20 MG tablet Take 1 tablet (20 mg total) by mouth as needed. 90 tablet 3   carbamide peroxide (DEBROX) 6.5 % OTIC solution Place 5 drops into the left ear 2 (two) times daily. (Patient not taking: Reported on 06/06/2021) 15 mL 0   ketoconazole (NIZORAL) 2 % cream Apply 1 application topically daily. Apply to affected area. (Patient not taking: Reported on 06/06/2021) 30 g 0   nicotine (NICODERM CQ - DOSED  IN MG/24 HOURS) 21 mg/24hr patch Place 1 patch (21 mg total) onto the skin daily. (Patient not taking: Reported on 06/06/2021) 28 patch 0   ondansetron (ZOFRAN-ODT) 4 MG disintegrating tablet Take 1 tablet (4 mg total) by mouth every 8 (eight) hours as needed for nausea or vomiting. (Patient not taking: No sig reported) 20 tablet 0   sodium phosphate (FLEET) 7-19 GM/118ML ENEM Place 133 mLs (1 enema total) rectally daily as needed for severe constipation. (Patient not taking: No sig reported) 133 mL 0   No current facility-administered  medications for this visit.    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  Tobacco Use   Smoking status: Every Day    Packs/day: 0.25    Types: Cigarettes   Smokeless tobacco: Never   Tobacco comments:    Smokes 5 cigarettes per day 04/02/21  Vaping Use   Vaping Use: Never used  Substance and Sexual Activity   Alcohol use: Yes    Comment: 2 beers per week   Drug use: Yes    Types: Marijuana    Comment: daily   Sexual activity: Not Currently  Other Topics Concern   Not on file  Social History Narrative   Not on file   Social Determinants of Health   Financial Resource Strain: Not on file  Food Insecurity: No Food Insecurity   Worried About Running Out of Food in the Last Year: Never true   Ran Out of Food in the Last Year: Never true  Transportation Needs: No Transportation Needs   Lack of Transportation (Medical): No   Lack of Transportation (Non-Medical): No  Physical Activity: Not on file  Stress: Not on file  Social Connections: Not on file  Intimate Partner Violence: Not on file    Subjective: Review of Systems  Constitutional:  Negative for chills and fever.  HENT:  Negative for congestion and hearing loss.   Eyes:  Negative for blurred vision and double vision.  Respiratory:  Negative for cough and shortness of breath.   Cardiovascular:  Negative for chest pain and palpitations.  Gastrointestinal:  Negative for abdominal pain, blood in stool, constipation, diarrhea, heartburn, melena and vomiting.  Genitourinary:  Negative for dysuria and urgency.  Musculoskeletal:  Negative for joint pain and  myalgias.  Skin:  Negative for itching and rash.  Neurological:  Negative for dizziness and headaches.  Psychiatric/Behavioral:  Negative for depression. The patient is not nervous/anxious.       Objective: BP 116/71   Pulse 63   Temp (!) 97.3 F (36.3 C) (Temporal)   Ht 5\' 10"  (1.778 m)   Wt 159 lb (72.1 kg)   BMI 22.81 kg/m  Physical Exam Constitutional:      Appearance: Normal appearance.  HENT:     Head: Normocephalic and atraumatic.  Eyes:     Extraocular Movements: Extraocular movements intact.     Conjunctiva/sclera: Conjunctivae normal.  Cardiovascular:     Rate and Rhythm: Normal rate and regular rhythm.  Pulmonary:  Effort: Pulmonary effort is normal.     Breath sounds: Normal breath sounds.  Abdominal:     General: Bowel sounds are normal.     Palpations: Abdomen is soft.  Musculoskeletal:        General: Normal range of motion.     Cervical back: Normal range of motion and neck supple.  Skin:    General: Skin is warm.  Neurological:     General: No focal deficit present.     Mental Status: He is alert and oriented to person, place, and time.  Psychiatric:        Mood and Affect: Mood normal.        Behavior: Behavior normal.     Assessment: *Fatty liver disease-alcohol induced *Colon cancer screening *Alcohol use-chronic  Plan: Discussed fatty liver in depth with patient today.  His is likely alcohol induced.  He has greatly decreased his alcohol use.  Recommend cessation if possible.  We will continue to monitor this.  Liver function tests have normalized.  Will schedule for screening colonoscopy.The risks including infection, bleed, or perforation as well as benefits, limitations, alternatives and imponderables have been reviewed with the patient. Questions have been answered. All parties agreeable.  Patient will need to hold his Eliquis x48 hours prior to this.  Okay to continue on baby aspirin.   06/06/2021 9:16 AM   Disclaimer: This  note was dictated with voice recognition software. Similar sounding words can inadvertently be transcribed and may not be corrected upon review.

## 2021-06-08 ENCOUNTER — Other Ambulatory Visit: Payer: Self-pay

## 2021-06-11 ENCOUNTER — Telehealth: Payer: Self-pay | Admitting: Pharmacist

## 2021-06-11 DIAGNOSIS — I2510 Atherosclerotic heart disease of native coronary artery without angina pectoris: Secondary | ICD-10-CM

## 2021-06-11 NOTE — Telephone Encounter (Signed)
    Patient Name: Jordan Kelly  DOB: Aug 06, 1961 MRN: [id number]  Primary Cardiologist: Bernerd Pho, PA-C  Chart reviewed as part of pre-operative protocol coverage.   Per pharmacy recommendations, patient can hold Eliquis 2 days prior to his upcoming colonoscopy with plans to restart as soon as he is cleared to do so by his gastroenterologist.  I will route this recommendation to the requesting party via [place] fax function and remove from pre-op pool.  Please call with questions.  Abigail Butts, PA-C 06/11/2021, 10:53 AM

## 2021-06-11 NOTE — Telephone Encounter (Signed)
Patient with diagnosis of aflutter on Eliquis for anticoagulation.    Procedure: colonoscopy Date of procedure: TBD  CHA2DS2-VASc Score = 2  This indicates a 2.2% annual risk of stroke. The patient's score is based upon: CHF History: 1 HTN History: 0 Diabetes History: 0 Stroke History: 0 Vascular Disease History: 1 Age Score: 0 Gender Score: 0   Underwent TEE/DCCV in 10/2020  CrCl >121mL/min Platelet count 306K  Per office protocol, patient can hold Eliquis for 2 days prior to procedure.

## 2021-06-11 NOTE — Telephone Encounter (Signed)
Clearance request routed to PharmD pool in letters tab, copying into phone note for ease of follow up:  [facility] [address] [place], [place]  [address] Phone:  [phone]   Fax:  [phone]    June 08, 2021     Milana Huntsman. Bilal ( DOB 02-03-1961) [place] [address]     Dear Louie Casa D. Tagg,   We would like to request holding the following medication for patient please.   Procedure: Colonoscopy ASA lll   Date: TBD   Medication to hold: Eliquis x 48 hrs   Surgeon: Dr. Elon Alas. Carver   Phone: [phone]   Fax:  [phone]   Type of Anesthesia: propofol       Thank you, Oleh Genin, CMA

## 2021-06-13 ENCOUNTER — Telehealth: Payer: Self-pay

## 2021-06-13 NOTE — Telephone Encounter (Signed)
Called pt to schedule TCS with propofol, asa 3 with Dr. Abbey Chatters. Patient requested December. Advised will call to schedule once we receive this schedule.

## 2021-06-13 NOTE — Telephone Encounter (Signed)
Pt Jordan Kelly has been cleared to hold his Eliquis 2 days prior to his procedure per Abigail Butts, PA-C

## 2021-06-18 ENCOUNTER — Telehealth: Payer: Self-pay | Admitting: Internal Medicine

## 2021-06-18 ENCOUNTER — Telehealth: Payer: Self-pay

## 2021-06-18 NOTE — Telephone Encounter (Signed)
Tried to call pt back, no answer, voicemail full

## 2021-06-18 NOTE — Telephone Encounter (Signed)
Tried to call pt to schedule TCS w/Propofol w/Dr. Abbey Chatters ASA 3, no answer, unable to leave voicemail d/t mailbox is full.  See previous phone, ok was received from cardiology for pt to hold Eliquis for 48 hours prior to procedure.

## 2021-06-18 NOTE — Telephone Encounter (Signed)
Pt said he was returning a call, but the call dropped before I could get [place]. Please call [phone]

## 2021-06-19 ENCOUNTER — Other Ambulatory Visit: Payer: Self-pay

## 2021-06-19 MED ORDER — PEG 3350-KCL-NA BICARB-NACL 420 G PO SOLR: 4000.0000 mL | ORAL | 0 refills | Status: DC

## 2021-06-19 NOTE — Telephone Encounter (Signed)
Called pt, TCS scheduled for 07/16/21 at 10:30am. Pt informed to hold Eliquis for 48 hours before procedure. Rx for prep sent to pharmacy. Orders entered.

## 2021-06-20 NOTE — Telephone Encounter (Signed)
Pre-op appt 07/12/21 at 9:00am. Appt letter mailed with procedure instructions.

## 2021-06-26 ENCOUNTER — Ambulatory Visit: Payer: 59 | Admitting: *Deleted

## 2021-06-26 DIAGNOSIS — K7030 Alcoholic cirrhosis of liver without ascites: Secondary | ICD-10-CM

## 2021-06-26 DIAGNOSIS — I50813 Acute on chronic right heart failure: Secondary | ICD-10-CM

## 2021-06-26 NOTE — Patient Instructions (Signed)
Visit Information Continue to weigh daily and record Call your doctor for weight gain 3 pounds overnight or 5 pounds in one week Follow low salt diet- read labels for sodium content Avoid salty snacks, fast food Take all medications as prescribed Call about your pre-admission visit (scheduled for 07/12/21) Try to avoid all tobacco products and alcohol   The patient verbalized understanding of instructions, educational materials, and care plan provided today and declined offer to receive copy of patient instructions, educational materials, and care plan.   Telephone follow up appointment with care management team member scheduled for:  09/25/2021  Jacqlyn Larsen [facility], BSN RN Case Manager [place] [facility] [phone]

## 2021-06-26 NOTE — Chronic Care Management (AMB) (Deleted)
Chronic Care Management   CCM RN Visit Note  06/26/2021 Name: Jordan Kelly MRN: [id number] DOB: [birth year]  Subjective: Jordan Kelly is a 60 y.o. year old male who is a primary care patient of Noreene Larsson, NP. The care management team was consulted for assistance with disease management and care coordination needs.    Engaged with patient by telephone for {CCMINITIALFOLLOWUPCHOICE:[id number]} in response to provider referral for case management and/or care coordination services.   Consent to Services:  The patient was given information about Chronic Care Management services, agreed to services, and gave verbal consent prior to initiation of services.  Please see initial visit note for detailed documentation.   Patient agreed to services and verbal consent obtained.   Assessment: Review of patient past medical history, allergies, medications, health status, including review of consultants reports, laboratory and other test data, was performed as part of comprehensive evaluation and provision of chronic care management services.   SDOH (Social Determinants of Health) assessments and interventions performed:    CCM Care Plan  No Known Allergies  Outpatient Encounter Medications as of 06/26/2021  Medication Sig   albuterol (VENTOLIN HFA) 108 (90 Base) MCG/ACT inhaler Inhale 2 puffs into the lungs every 6 (six) hours as needed for wheezing or shortness of breath.   apixaban (ELIQUIS) 5 MG TABS tablet Take 1 tablet (5 mg total) by mouth 2 (two) times daily.   bisoprolol (ZEBETA) 5 MG tablet Take 1 tablet (5 mg total) by mouth daily.   diazePAM (VALIUM PO) Take 2.5 mg by mouth daily.   fluticasone-salmeterol (ADVAIR DISKUS) 250-50 MCG/ACT AEPB Inhale 1 puff into the lungs in the morning and at bedtime.   Multiple Vitamin (MULTIVITAMIN WITH MINERALS) TABS tablet Take 1 tablet by mouth daily.   tetrahydrozoline-zinc (VISINE-AC) 0.05-0.25 % ophthalmic solution Place 1-2 drops into both eyes  3 (three) times daily as needed (dry/irritated eyes.).   bisacodyl (DULCOLAX) 10 MG suppository Place 1 suppository (10 mg total) rectally as needed for moderate constipation. (Patient not taking: Reported on 06/26/2021)   carbamide peroxide (DEBROX) 6.5 % OTIC solution Place 5 drops into the left ear 2 (two) times daily. (Patient not taking: No sig reported)   ketoconazole (NIZORAL) 2 % cream Apply 1 application topically daily. Apply to affected area. (Patient not taking: No sig reported)   nicotine (NICODERM CQ - DOSED IN MG/24 HOURS) 21 mg/24hr patch Place 1 patch (21 mg total) onto the skin daily. (Patient not taking: No sig reported)   ondansetron (ZOFRAN-ODT) 4 MG disintegrating tablet Take 1 tablet (4 mg total) by mouth every 8 (eight) hours as needed for nausea or vomiting. (Patient not taking: No sig reported)   polyethylene glycol-electrolytes (TRILYTE) 420 g solution Take 4,000 mLs by mouth as directed. (Patient not taking: Reported on 06/26/2021)   sodium phosphate (FLEET) 7-19 GM/118ML ENEM Place 133 mLs (1 enema total) rectally daily as needed for severe constipation. (Patient not taking: No sig reported)   torsemide (DEMADEX) 20 MG tablet Take 1 tablet (20 mg total) by mouth as needed.   No facility-administered encounter medications on file as of 06/26/2021.    Patient Active Problem List   Diagnosis Date Noted   CAD (coronary artery disease) 06/11/2021   Left ear impacted cerumen 03/20/2021   Tinea cruris 03/13/2021   A-fib ([place]) 03/13/2021   Abnormal liver diagnostic imaging 12/12/2020   Pulmonary hypertension, primary ([place]) 11/28/2020   PFO (patent foramen ovale) 11/14/2020   Cirrhosis ([place]) 11/14/2020  Psoriasis 11/14/2020   AKI (acute kidney injury) ([place]) 11/14/2020   CHF (congestive heart failure) ([place]) 11/05/2020   Congestive heart failure (CHF) ([place]) 11/04/2020   Encounter to establish care 10/30/2020   Emphysema/COPD ([place]) 10/30/2020   Constipation 10/30/2020    Nausea 10/30/2020   Tobacco abuse 01/09/2019   Alcohol abuse 01/09/2019    Conditions to be addressed/monitored:CHF, Cirrhosis   Care Plan : RN Care Manager plan of care  Updates made by Kassie Mends, RN since 06/26/2021 12:00 AM     Problem: No plan of care established for management of chronic disease states (Fatty liver/ cirrhosis, CHF)   Priority: High     Long-Range Goal: Development of plan of care for chronic disease management (Fatty liver/ cirrhosis, CHF)   Priority: High  Note:   Current Barriers:  Knowledge Deficits related to plan of care for management of CHF and fatty liver/ cirrhosis.  Knowledge deficit related to basic heart failure pathophysiology and self care management- needs reinforcement of CHF action plan Financial strain- patient is back at work 30-35 hours per week at [employer], this is all he is able to do at present due to health issues, pt hopes to increase his hours in the future, continues to exercise daily and get outdoors.  Reports to have colonoscopy in December after visiting GI doctor on 06/06/21. Patient reports he continues to weigh daily. Ineffective Self Health Maintenance - Does not have HCPOA and living will in place, not interested at present Financial concerns- pt is back at work 30-35 hours per week Rides a moped and sister transports patient to out of town appointments, does not feel he needs to utilize [employer] transportation at present, pt is getting out to walk his dog several times per week, continues using nicoderm patches at times, still smokes approximately 5 cigarettes per day, had ultrasound (liver) and states good report, continues to follow up with GI doctor. Has eliquis as is able to afford at present  [place]):  Patient will attend all scheduled appointments - colonoscopy 07/16/21, as evidenced by pt report, review of EHR through collaboration with RN Care manager, provider, and care team.  Patient will  take all medications as prescribed as evidenced by pt report, review of EHR Patient will verbalize understanding of CHF, fatty liver/ cirrhosis with continued self care as evidenced by pt report and review of EHR.  Interventions: 1:1 collaboration with primary care provider regarding development and update of comprehensive plan of care as evidenced by provider attestation and co-signature Inter-disciplinary care team collaboration (see longitudinal plan of care) Evaluation of current treatment plan related to  self management and patient's adherence to plan as established by provider  Fatty liver/cirrhosis Interventions: Goal on track:  Yes Evaluation of current treatment plan related to  cirrhosis , self-management and patient's adherence to plan as established by provider. Discussed plans with patient for ongoing care management follow up and provided patient with direct contact information for care management team Provided education to patient re: eating healthy, getting outside daily and contiuing to walk; Discussed plans with patient for ongoing care management follow up and provided patient with direct contact information for care management team; Reviewed importance smoking cessation and consuming no alcohol Pain assessment completed Reviewed upcoming appointment- colonoscopy scheduled for 07/16/2021  Heart Failure Interventions: Provided patient with education about the role of exercise in the management of heart failure;  Reinforced heart failure action plan with emphasis on yellow zone Reviewed all medications  and importance of taking as prescribed Reinforced low sodium diet and reading food labels Reviewed energy conservation and alternating activity with rest  Patient Goals/Self-Care Activities: Patient will self administer medications as prescribed as evidenced by self report/primary caregiver report  Patient will attend all scheduled provider appointments as evidenced by  clinician review of documented attendance to scheduled appointments and patient/caregiver report Patient will continue to perform ADL's independently as evidenced by patient/caregiver report Patient will continue to perform IADL's independently as evidenced by patient/caregiver report Patient will call provider office for new concerns or questions as evidenced by review of documented incoming telephone call notes and patient report Continue to weigh daily and record Call your doctor for weight gain 3 pounds overnight or 5 pounds in one week Follow low salt diet- read labels for sodium content Avoid salty snacks, fast food Take all medications as prescribed Call about your pre-admission visit (scheduled for 07/12/21) Try to avoid all tobacco products and alcohol   Follow Up Plan:  Telephone follow up appointment with care management team member scheduled for:  09/25/2020       Plan:Telephone follow up appointment with care management team member scheduled for:  09/25/2021  Jacqlyn Larsen [facility], BSN RN Case Manager [place] [facility] [phone]

## 2021-06-26 NOTE — Chronic Care Management (AMB) (Signed)
Care Management    RN Visit Note  06/26/2021 Name: Jordan Kelly MRN: [id number] DOB: 1961/04/07  Subjective: Jordan Kelly is a 60 y.o. year old male who is a primary care patient of Jordan Larsson, NP. The care management team was consulted for assistance with disease management and care coordination needs.    Engaged with patient by telephone for follow up visit in response to provider referral for case management and/or care coordination services.   Consent to Services:   Jordan Kelly was given information about Care Management services today including:  Care Management services includes personalized support from designated clinical staff supervised by his physician, including individualized plan of care and coordination with other care providers 24/7 contact phone numbers for assistance for urgent and routine care needs. The patient may stop case management services at any time by phone call to the office staff.  Patient agreed to services and consent obtained.   Assessment: Review of patient past medical history, allergies, medications, health status, including review of consultants reports, laboratory and other test data, was performed as part of comprehensive evaluation and provision of chronic care management services.   SDOH (Social Determinants of Health) assessments and interventions performed:    Care Plan  No Known Allergies  Outpatient Encounter Medications as of 06/26/2021  Medication Sig   albuterol (VENTOLIN HFA) 108 (90 Base) MCG/ACT inhaler Inhale 2 puffs into the lungs every 6 (six) hours as needed for wheezing or shortness of breath.   apixaban (ELIQUIS) 5 MG TABS tablet Take 1 tablet (5 mg total) by mouth 2 (two) times daily.   bisoprolol (ZEBETA) 5 MG tablet Take 1 tablet (5 mg total) by mouth daily.   diazePAM (VALIUM PO) Take 2.5 mg by mouth daily.   fluticasone-salmeterol (ADVAIR DISKUS) 250-50 MCG/ACT AEPB Inhale 1 puff into the lungs in the morning and at  bedtime.   Multiple Vitamin (MULTIVITAMIN WITH MINERALS) TABS tablet Take 1 tablet by mouth daily.   tetrahydrozoline-zinc (VISINE-AC) 0.05-0.25 % ophthalmic solution Place 1-2 drops into both eyes 3 (three) times daily as needed (dry/irritated eyes.).   bisacodyl (DULCOLAX) 10 MG suppository Place 1 suppository (10 mg total) rectally as needed for moderate constipation. (Patient not taking: Reported on 06/26/2021)   carbamide peroxide (DEBROX) 6.5 % OTIC solution Place 5 drops into the left ear 2 (two) times daily. (Patient not taking: No sig reported)   ketoconazole (NIZORAL) 2 % cream Apply 1 application topically daily. Apply to affected area. (Patient not taking: No sig reported)   nicotine (NICODERM CQ - DOSED IN MG/24 HOURS) 21 mg/24hr patch Place 1 patch (21 mg total) onto the skin daily. (Patient not taking: No sig reported)   ondansetron (ZOFRAN-ODT) 4 MG disintegrating tablet Take 1 tablet (4 mg total) by mouth every 8 (eight) hours as needed for nausea or vomiting. (Patient not taking: No sig reported)   polyethylene glycol-electrolytes (TRILYTE) 420 g solution Take 4,000 mLs by mouth as directed. (Patient not taking: Reported on 06/26/2021)   sodium phosphate (FLEET) 7-19 GM/118ML ENEM Place 133 mLs (1 enema total) rectally daily as needed for severe constipation. (Patient not taking: No sig reported)   torsemide (DEMADEX) 20 MG tablet Take 1 tablet (20 mg total) by mouth as needed.   No facility-administered encounter medications on file as of 06/26/2021.    Patient Active Problem List   Diagnosis Date Noted   CAD (coronary artery disease) 06/11/2021   Left ear impacted cerumen 03/20/2021  Tinea cruris 03/13/2021   A-fib ([place]) 03/13/2021   Abnormal liver diagnostic imaging 12/12/2020   Pulmonary hypertension, primary ([place]) 11/28/2020   PFO (patent foramen ovale) 11/14/2020   Cirrhosis ([place]) 11/14/2020   Psoriasis 11/14/2020   AKI (acute kidney injury) ([place]) 11/14/2020   CHF  (congestive heart failure) ([place]) 11/05/2020   Congestive heart failure (CHF) ([place]) 11/04/2020   Encounter to establish care 10/30/2020   Emphysema/COPD ([place]) 10/30/2020   Constipation 10/30/2020   Nausea 10/30/2020   Tobacco abuse 01/09/2019   Alcohol abuse 01/09/2019    Conditions to be addressed/monitored: CHF and Cirrhosis   Care Plan : RN Care Manager plan of care  Updates made by Jordan Mends, RN since 06/26/2021 12:00 AM     Problem: No plan of care established for management of chronic disease states (Fatty liver/ cirrhosis, CHF)   Priority: High     Long-Range Goal: Development of plan of care for chronic disease management (Fatty liver/ cirrhosis, CHF)   Priority: High  Note:   Current Barriers:  Knowledge Deficits related to plan of care for management of CHF and fatty liver/ cirrhosis.  Knowledge deficit related to basic heart failure pathophysiology and self care management- needs reinforcement of CHF action plan Financial strain- patient is back at work 30-35 hours per week at [employer], this is all he is able to do at present due to health issues, pt hopes to increase his hours in the future, continues to exercise daily and get outdoors.  Reports to have colonoscopy in December after visiting GI doctor on 06/06/21. Patient reports he continues to weigh daily. Ineffective Self Health Maintenance - Does not have HCPOA and living will in place, not interested at present Financial concerns- pt is back at work 30-35 hours per week Rides a moped and sister transports patient to out of town appointments, does not feel he needs to utilize [employer] transportation at present, pt is getting out to walk his dog several times per week, continues using nicoderm patches at times, still smokes approximately 5 cigarettes per day, had ultrasound (liver) and states good report, continues to follow up with GI doctor. Has eliquis as is able to afford at present  [place]):  Patient will attend all scheduled appointments - colonoscopy 07/16/21, as evidenced by pt report, review of EHR through collaboration with RN Care manager, provider, and care team.  Patient will take all medications as prescribed as evidenced by pt report, review of EHR Patient will verbalize understanding of CHF, fatty liver/ cirrhosis with continued self care as evidenced by pt report and review of EHR.  Interventions: 1:1 collaboration with primary care provider regarding development and update of comprehensive plan of care as evidenced by provider attestation and co-signature Inter-disciplinary care team collaboration (see longitudinal plan of care) Evaluation of current treatment plan related to  self management and patient's adherence to plan as established by provider  Fatty liver/cirrhosis Interventions: Goal on track:  Yes Evaluation of current treatment plan related to  cirrhosis , self-management and patient's adherence to plan as established by provider. Discussed plans with patient for ongoing care management follow up and provided patient with direct contact information for care management team Provided education to patient re: eating healthy, getting outside daily and contiuing to walk; Discussed plans with patient for ongoing care management follow up and provided patient with direct contact information for care management team; Reviewed importance smoking cessation and consuming no alcohol Pain assessment completed Reviewed upcoming  appointment- colonoscopy scheduled for 07/16/2021  Heart Failure Interventions: Provided patient with education about the role of exercise in the management of heart failure;  Reinforced heart failure action plan with emphasis on yellow zone Reviewed all medications and importance of taking as prescribed Reinforced low sodium diet and reading food labels Reviewed energy conservation and alternating activity with rest  Patient  Goals/Self-Care Activities: Patient will self administer medications as prescribed as evidenced by self report/primary caregiver report  Patient will attend all scheduled provider appointments as evidenced by clinician review of documented attendance to scheduled appointments and patient/caregiver report Patient will continue to perform ADL's independently as evidenced by patient/caregiver report Patient will continue to perform IADL's independently as evidenced by patient/caregiver report Patient will call provider office for new concerns or questions as evidenced by review of documented incoming telephone call notes and patient report Continue to weigh daily and record Call your doctor for weight gain 3 pounds overnight or 5 pounds in one week Follow low salt diet- read labels for sodium content Avoid salty snacks, fast food Take all medications as prescribed Call about your pre-admission visit (scheduled for 07/12/21) Try to avoid all tobacco products and alcohol   Follow Up Plan:  Telephone follow up appointment with care management team member scheduled for:  09/25/2020       Plan: Telephone follow up appointment with care management team member scheduled for:  09/25/2021  Jacqlyn Larsen [facility], BSN RN Case Manager [place] [facility] [phone]

## 2021-07-09 NOTE — Patient Instructions (Addendum)
Jordan Kelly  07/09/2021     @PREFPERIOPPHARMACY @   Your procedure is scheduled on  07/16/2021.   Report to [facility] at  [place]  A.M.   Call this number if you have problems the morning of surgery:  [phone]   Remember:  Follow the diet and prep instructions given to you by the office.    Use your inhaler before you come and bring your rescue inhaler before you come.    Your last dose of eliquis should be 07/13/2021.    Take these medicines the morning of surgery with A SIP OF WATER                            bisoprolol, valium (if needed).      Do not wear jewelry, make-up or nail polish.  Do not wear lotions, powders, or perfumes, or deodorant.  Do not shave 48 hours prior to surgery.  Men may shave face and neck.  Do not bring valuables to the hospital.  [facility] is not responsible for any belongings or valuables.  Contacts, dentures or bridgework may not be worn into surgery.  Leave your suitcase in the car.  After surgery it may be brought to your room.  For patients admitted to the hospital, discharge time will be determined by your treatment team.  Patients discharged the day of surgery will not be allowed to drive home and must have someone with them for 24 hours.    Special instructions:   DO NOT smoke tobacco or vape for 24 hours before your procedure.  Please read over the following fact sheets that you were given. Anesthesia Post-op Instructions and Care and Recovery After Surgery      Colonoscopy, Adult, Care After This sheet gives you information about how to care for yourself after your procedure. Your health care provider may also give you more specific instructions. If you have problems or questions, contact your health care provider. What can I expect after the procedure? After the procedure, it is common to have: A small amount of blood in your stool for 24 hours after the procedure. Some gas. Mild cramping or bloating of your  abdomen. Follow these instructions at home: Eating and drinking  Drink enough fluid to keep your urine pale yellow. Follow instructions from your health care provider about eating or drinking restrictions. Resume your normal diet as instructed by your health care provider. Avoid heavy or fried foods that are hard to digest. Activity Rest as told by your health care provider. Avoid sitting for a long time without moving. Get up to take short walks every 1-2 hours. This is important to improve blood flow and breathing. Ask for help if you feel weak or unsteady. Return to your normal activities as told by your health care provider. Ask your health care provider what activities are safe for you. Managing cramping and bloating  Try walking around when you have cramps or feel bloated. Apply heat to your abdomen as told by your health care provider. Use the heat source that your health care provider recommends, such as a moist heat pack or a heating pad. Place a towel between your skin and the heat source. Leave the heat on for 20-30 minutes. Remove the heat if your skin turns bright red. This is especially important if you are unable to feel pain, heat, or cold. You may have a  greater risk of getting burned. General instructions If you were given a sedative during the procedure, it can affect you for several hours. Do not drive or operate machinery until your health care provider says that it is safe. For the first 24 hours after the procedure: Do not sign important documents. Do not drink alcohol. Do your regular daily activities at a slower pace than normal. Eat soft foods that are easy to digest. Take over-the-counter and prescription medicines only as told by your health care provider. Keep all follow-up visits as told by your health care provider. This is important. Contact a health care provider if: You have blood in your stool 2-3 days after the procedure. Get help right away if you  have: More than a small spotting of blood in your stool. Large blood clots in your stool. Swelling of your abdomen. Nausea or vomiting. A fever. Increasing pain in your abdomen that is not relieved with medicine. Summary After the procedure, it is common to have a small amount of blood in your stool. You may also have mild cramping and bloating of your abdomen. If you were given a sedative during the procedure, it can affect you for several hours. Do not drive or operate machinery until your health care provider says that it is safe. Get help right away if you have a lot of blood in your stool, nausea or vomiting, a fever, or increased pain in your abdomen. This information is not intended to replace advice given to you by your health care provider. Make sure you discuss any questions you have with your health care provider. Document Revised: 06/04/2019 Document Reviewed: 02/22/2019 Elsevier Patient Education  [place] After This sheet gives you information about how to care for yourself after your procedure. Your health care provider may also give you more specific instructions. If you have problems or questions, contact your health care provider. What can I expect after the procedure? After the procedure, it is common to have: Tiredness. Forgetfulness about what happened after the procedure. Impaired judgment for important decisions. Nausea or vomiting. Some difficulty with balance. Follow these instructions at home: For the time period you were told by your health care provider:   Rest as needed. Do not participate in activities where you could fall or become injured. Do not drive or use machinery. Do not drink alcohol. Do not take sleeping pills or medicines that cause drowsiness. Do not make important decisions or sign legal documents. Do not take care of children on your own. Eating and drinking Follow the diet that is recommended  by your health care provider. Drink enough fluid to keep your urine pale yellow. If you vomit: Drink water, juice, or soup when you can drink without vomiting. Make sure you have little or no nausea before eating solid foods. General instructions Have a responsible adult stay with you for the time you are told. It is important to have someone help care for you until you are awake and alert. Take over-the-counter and prescription medicines only as told by your health care provider. If you have sleep apnea, surgery and certain medicines can increase your risk for breathing problems. Follow instructions from your health care provider about wearing your sleep device: Anytime you are sleeping, including during daytime naps. While taking prescription pain medicines, sleeping medicines, or medicines that make you drowsy. Avoid smoking. Keep all follow-up visits as told by your health care provider. This is important. Contact a  health care provider if: You keep feeling nauseous or you keep vomiting. You feel light-headed. You are still sleepy or having trouble with balance after 24 hours. You develop a rash. You have a fever. You have redness or swelling around the IV site. Get help right away if: You have trouble breathing. You have new-onset confusion at home. Summary For several hours after your procedure, you may feel tired. You may also be forgetful and have poor judgment. Have a responsible adult stay with you for the time you are told. It is important to have someone help care for you until you are awake and alert. Rest as told. Do not drive or operate machinery. Do not drink alcohol or take sleeping pills. Get help right away if you have trouble breathing, or if you suddenly become confused. This information is not intended to replace advice given to you by your health care provider. Make sure you discuss any questions you have with your health care provider. Document Revised: 04/13/2020  Document Reviewed: 07/01/2019 Elsevier Patient Education  2022 [employer].

## 2021-07-10 ENCOUNTER — Encounter (HOSPITAL_COMMUNITY)
Admission: RE | Admit: 2021-07-10 | Discharge: 2021-07-10 | Disposition: A | Discharge status: Home / Self Care | Payer: 59 | Source: Ambulatory Visit | Attending: Internal Medicine | Admitting: Internal Medicine

## 2021-07-10 ENCOUNTER — Encounter (HOSPITAL_COMMUNITY): Payer: Self-pay

## 2021-07-10 HISTORY — DX: Cardiac arrhythmia, unspecified: I49.9

## 2021-07-10 HISTORY — DX: Heart failure, unspecified: I50.9

## 2021-07-12 ENCOUNTER — Encounter (HOSPITAL_COMMUNITY): Payer: 59

## 2021-07-16 ENCOUNTER — Ambulatory Visit (HOSPITAL_COMMUNITY): Payer: 59 | Admitting: Anesthesiology

## 2021-07-16 ENCOUNTER — Encounter (HOSPITAL_COMMUNITY): Payer: Self-pay

## 2021-07-16 ENCOUNTER — Other Ambulatory Visit: Payer: Self-pay

## 2021-07-16 ENCOUNTER — Encounter (HOSPITAL_COMMUNITY)
Admission: RE | Disposition: A | Discharge status: Home / Self Care | Payer: Self-pay | Source: Home / Self Care | Attending: Internal Medicine

## 2021-07-16 ENCOUNTER — Ambulatory Visit (HOSPITAL_COMMUNITY)
Admission: RE | Admit: 2021-07-16 | Discharge: 2021-07-16 | Disposition: A | Discharge status: Home / Self Care | Payer: 59 | Attending: Internal Medicine | Admitting: Internal Medicine

## 2021-07-16 DIAGNOSIS — D123 Benign neoplasm of transverse colon: Secondary | ICD-10-CM

## 2021-07-16 DIAGNOSIS — D122 Benign neoplasm of ascending colon: Secondary | ICD-10-CM | POA: Diagnosis not present

## 2021-07-16 DIAGNOSIS — I2510 Atherosclerotic heart disease of native coronary artery without angina pectoris: Secondary | ICD-10-CM | POA: Diagnosis not present

## 2021-07-16 DIAGNOSIS — K7460 Unspecified cirrhosis of liver: Secondary | ICD-10-CM | POA: Diagnosis not present

## 2021-07-16 DIAGNOSIS — D125 Benign neoplasm of sigmoid colon: Secondary | ICD-10-CM | POA: Insufficient documentation

## 2021-07-16 DIAGNOSIS — K648 Other hemorrhoids: Secondary | ICD-10-CM | POA: Diagnosis not present

## 2021-07-16 DIAGNOSIS — D127 Benign neoplasm of rectosigmoid junction: Secondary | ICD-10-CM

## 2021-07-16 DIAGNOSIS — D120 Benign neoplasm of cecum: Secondary | ICD-10-CM | POA: Diagnosis not present

## 2021-07-16 DIAGNOSIS — F32A Depression, unspecified: Secondary | ICD-10-CM | POA: Diagnosis not present

## 2021-07-16 DIAGNOSIS — F17210 Nicotine dependence, cigarettes, uncomplicated: Secondary | ICD-10-CM | POA: Insufficient documentation

## 2021-07-16 DIAGNOSIS — I4891 Unspecified atrial fibrillation: Secondary | ICD-10-CM | POA: Insufficient documentation

## 2021-07-16 DIAGNOSIS — I509 Heart failure, unspecified: Secondary | ICD-10-CM | POA: Insufficient documentation

## 2021-07-16 DIAGNOSIS — Z1211 Encounter for screening for malignant neoplasm of colon: Secondary | ICD-10-CM

## 2021-07-16 DIAGNOSIS — J449 Chronic obstructive pulmonary disease, unspecified: Secondary | ICD-10-CM | POA: Diagnosis not present

## 2021-07-16 HISTORY — PX: COLONOSCOPY WITH PROPOFOL: SHX5780

## 2021-07-16 HISTORY — PX: POLYPECTOMY: SHX5525

## 2021-07-16 SURGERY — COLONOSCOPY WITH PROPOFOL
Anesthesia: General

## 2021-07-16 MED ORDER — EPHEDRINE 5 MG/ML INJ
INTRAVENOUS | Status: AC
  Filled 2021-07-16: qty 5

## 2021-07-16 MED ORDER — EPHEDRINE SULFATE-NACL 50-0.9 MG/10ML-% IV SOSY
PREFILLED_SYRINGE | INTRAVENOUS | Status: DC | PRN
  Administered 2021-07-16: 5 mg via INTRAVENOUS

## 2021-07-16 MED ORDER — PHENYLEPHRINE 40 MCG/ML (10ML) SYRINGE FOR IV PUSH (FOR BLOOD PRESSURE SUPPORT)
PREFILLED_SYRINGE | INTRAVENOUS | Status: AC
  Filled 2021-07-16: qty 10

## 2021-07-16 MED ORDER — PHENYLEPHRINE 40 MCG/ML (10ML) SYRINGE FOR IV PUSH (FOR BLOOD PRESSURE SUPPORT)
PREFILLED_SYRINGE | INTRAVENOUS | Status: DC | PRN
  Administered 2021-07-16: 80 ug via INTRAVENOUS
  Administered 2021-07-16: 120 ug via INTRAVENOUS
  Administered 2021-07-16: 80 ug via INTRAVENOUS
  Administered 2021-07-16: 40 ug via INTRAVENOUS
  Administered 2021-07-16: 80 ug via INTRAVENOUS

## 2021-07-16 MED ORDER — LIDOCAINE HCL (CARDIAC) PF 100 MG/5ML IV SOSY
PREFILLED_SYRINGE | INTRAVENOUS | Status: DC | PRN
  Administered 2021-07-16: 50 mg via INTRAVENOUS

## 2021-07-16 MED ORDER — DEXMEDETOMIDINE (PRECEDEX) IN NS 20 MCG/5ML (4 MCG/ML) IV SYRINGE
PREFILLED_SYRINGE | INTRAVENOUS | Status: AC
  Filled 2021-07-16: qty 5

## 2021-07-16 MED ORDER — DEXMEDETOMIDINE (PRECEDEX) IN NS 20 MCG/5ML (4 MCG/ML) IV SYRINGE
PREFILLED_SYRINGE | INTRAVENOUS | Status: DC | PRN
  Administered 2021-07-16: 10 ug via INTRAVENOUS

## 2021-07-16 MED ORDER — LACTATED RINGERS IV SOLN: INTRAVENOUS | Status: DC

## 2021-07-16 MED ORDER — PROPOFOL 10 MG/ML IV BOLUS
INTRAVENOUS | Status: DC | PRN
  Administered 2021-07-16: 40 mg via INTRAVENOUS
  Administered 2021-07-16: 50 mg via INTRAVENOUS
  Administered 2021-07-16: 40 mg via INTRAVENOUS
  Administered 2021-07-16: 120 mg via INTRAVENOUS

## 2021-07-16 NOTE — H&P (Signed)
Primary Care Physician:  Noreene Larsson, NP Primary Gastroenterologist:  Dr. Abbey Chatters  Pre-Procedure History & Physical: HPI:  Jordan Kelly is a 60 y.o. male is here for a colonoscopy for colon cancer screening purposes.  Patient denies any family history of colorectal cancer.  No melena or hematochezia.  No abdominal pain or unintentional weight loss.  No change in bowel habits.  Overall feels well from a GI standpoint.  Past Medical History:  Diagnosis Date   Atrial flutter ([place])    a. s/p TEE/DCCV in 10/2020   CHF (congestive heart failure) (HCC)    COPD (chronic obstructive pulmonary disease) (HCC)    Depression    Dysrhythmia    PFO (patent foramen ovale)    Psoriasis     Past Surgical History:  Procedure Laterality Date   BUBBLE STUDY  11/08/2020   Procedure: BUBBLE STUDY;  Surgeon: Jerline Pain, MD;  Location: [facility];  Service: Cardiovascular;;   CARDIOVERSION N/A 11/08/2020   Procedure: CARDIOVERSION;  Surgeon: Jerline Pain, MD;  Location: [facility];  Service: Cardiovascular;  Laterality: N/A;   HERNIA REPAIR     right inguinal hernia   RIGHT/LEFT HEART CATH AND CORONARY ANGIOGRAPHY N/A 01/29/2021   Procedure: RIGHT/LEFT HEART CATH AND CORONARY ANGIOGRAPHY;  Surgeon: Sherren Mocha, MD;  Location: [facility];  Service: Cardiovascular;  Laterality: N/A;   TEE WITHOUT CARDIOVERSION N/A 11/08/2020   Procedure: TRANSESOPHAGEAL ECHOCARDIOGRAM (TEE);  Surgeon: Jerline Pain, MD;  Location: [facility] ENDOSCOPY;  Service: Cardiovascular;  Laterality: N/A;    Prior to Admission medications   Medication Sig Start Date End Date Taking? Authorizing Provider  albuterol (VENTOLIN HFA) 108 (90 Base) MCG/ACT inhaler Inhale 2 puffs into the lungs every 6 (six) hours as needed for wheezing or shortness of breath. 01/22/21  Yes Chesley Mires, MD  bisoprolol (ZEBETA) 5 MG tablet Take 1 tablet (5 mg total) by mouth daily. 02/21/21  Yes Strader, [place] M, PA-C  diazePAM (VALIUM PO)  Take 2.5 mg by mouth daily.   Yes [provider]  polyethylene glycol-electrolytes (TRILYTE) 420 g solution Take 4,000 mLs by mouth as directed. 06/19/21  Yes Lorry Furber, Elon Alas, DO  apixaban (ELIQUIS) 5 MG TABS tablet Take 1 tablet (5 mg total) by mouth 2 (two) times daily. 11/14/20   Tommie Raymond, NP  bisacodyl (DULCOLAX) 10 MG suppository Place 1 suppository (10 mg total) rectally as needed for moderate constipation. Patient not taking: Reported on 06/26/2021 10/30/20   Noreene Larsson, NP  carbamide peroxide (DEBROX) 6.5 % OTIC solution Place 5 drops into the left ear 2 (two) times daily. Patient not taking: No sig reported 03/13/21   Noreene Larsson, NP  fluticasone-salmeterol (ADVAIR DISKUS) 250-50 MCG/ACT AEPB Inhale 1 puff into the lungs in the morning and at bedtime. 01/29/21   Sherren Mocha, MD  ketoconazole (NIZORAL) 2 % cream Apply 1 application topically daily. Apply to affected area. Patient not taking: No sig reported 03/13/21   Noreene Larsson, NP  Multiple Vitamin (MULTIVITAMIN WITH MINERALS) TABS tablet Take 1 tablet by mouth daily. 01/10/19   Roxan Hockey, MD  nicotine (NICODERM CQ - DOSED IN MG/24 HOURS) 21 mg/24hr patch Place 1 patch (21 mg total) onto the skin daily. Patient not taking: No sig reported 11/14/20   Kathyrn Drown D, NP  ondansetron (ZOFRAN-ODT) 4 MG disintegrating tablet Take 1 tablet (4 mg total) by mouth every 8 (eight) hours as needed for nausea or vomiting. Patient not  taking: No sig reported 10/30/20   Noreene Larsson, NP  sodium phosphate (FLEET) 7-19 GM/118ML ENEM Place 133 mLs (1 enema total) rectally daily as needed for severe constipation. Patient not taking: No sig reported 10/30/20   Noreene Larsson, NP  tetrahydrozoline-zinc (VISINE-AC) 0.05-0.25 % ophthalmic solution Place 1-2 drops into both eyes 3 (three) times daily as needed (dry/irritated eyes.).    [provider]  torsemide (DEMADEX) 20 MG tablet Take 1 tablet (20 mg total) by mouth  as needed. 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  Tobacco Use   Smoking status: Every Day    Packs/day: 0.25    Types: Cigarettes   Smokeless tobacco: Never   Tobacco comments:    Smokes 5 cigarettes per day 04/02/21  Vaping Use   Vaping Use: Never used  Substance and Sexual Activity   Alcohol use: Yes    Comment: 2 beers per week   Drug use: Yes    Types: Marijuana    Comment: daily   Sexual activity: Not Currently  Other Topics Concern   Not on file  Social History Narrative   Not on file   Social Determinants of Health   Financial Resource Strain: Not on file  Food Insecurity: No Food Insecurity   Worried About Running Out of Food in the Last Year: Never true   Ran Out of Food in the Last Year: Never true  Transportation Needs: No Transportation Needs   Lack of Transportation (Medical): No   Lack of Transportation (Non-Medical): No  Physical Activity: Not on file  Stress: Not on file  Social Connections: Not on file  Intimate Partner Violence: Not on file    Review of Systems: See HPI, otherwise negative ROS  Physical Exam: Vital signs in last 24 hours:     General:   Alert,  Well-developed, well-nourished, pleasant and cooperative in NAD Head:  Normocephalic and atraumatic. Eyes:  Sclera clear, no icterus.   Conjunctiva pink. Ears:  Normal auditory acuity. Nose:  No deformity, discharge,  or lesions. Mouth:  No deformity or lesions, dentition normal. Neck:  Supple; no masses or thyromegaly. Lungs:  Clear throughout to auscultation.   No wheezes,  crackles, or rhonchi. No acute distress. Heart:  Regular rate and rhythm; no murmurs, clicks, rubs,  or gallops. Abdomen:  Soft, nontender and nondistended. No masses, hepatosplenomegaly or hernias noted. Normal bowel sounds, without guarding, and without rebound.   Msk:  Symmetrical without gross deformities. Normal posture. Extremities:  Without clubbing or edema. Neurologic:  Alert and  oriented x4;  grossly normal neurologically. Skin:  Intact without significant lesions or rashes. Cervical Nodes:  No significant cervical adenopathy. Psych:  Alert and cooperative. Normal mood and affect.  Impression/Plan: Jordan Kelly is here for a colonoscopy to be performed for colon cancer screening purposes.  The risks of the procedure including infection, bleed, or perforation as well as benefits, limitations, alternatives and imponderables have been reviewed with the  patient. Questions have been answered. All parties agreeable.

## 2021-07-16 NOTE — Op Note (Signed)
[facility] Patient Name: Jordan Kelly Procedure Date: 07/16/2021 8:51 AM MRN: [id number] Date of Birth: 1961-06-12 Attending MD: Elon Alas. Edgar Frisk CSN: [id number] Age: 60 Admit Type: Outpatient Procedure:                Colonoscopy Indications:              Screening for colorectal malignant neoplasm Providers:                Elon Alas. Abbey Chatters, DO, Charlsie Quest. Theda Sers RN, RN,                            Nelma Rothman, Technician Referring MD:              Medicines:                See the Anesthesia note for documentation of the                            administered medications Complications:            No immediate complications. Estimated Blood Loss:     Estimated blood loss was minimal. Procedure:                Pre-Anesthesia Assessment:                           - The anesthesia plan was to use monitored                            anesthesia care (MAC).                           After obtaining informed consent, the colonoscope                            was passed under direct vision. Throughout the                            procedure, the patient's blood pressure, pulse, and                            oxygen saturations were monitored continuously. The                            [id number]-HQ190L ([id number]) scope was introduced through                            the anus and advanced to the the cecum, identified                            by appendiceal orifice and ileocecal valve. The                            colonoscopy was performed without difficulty. The                            patient tolerated the procedure  well. The quality                            of the bowel preparation was evaluated using the                            BBPS [facility] Bowel Preparation Scale) with scores                            of: Right Colon = 3, Transverse Colon = 3 and Left                            Colon = 3 (entire mucosa seen well with no residual                            staining,  small fragments of stool or opaque                            liquid). The total BBPS score equals 9. Scope In: 9:12:41 AM Scope Out: 9:30:52 AM Scope Withdrawal Time: 0 hours 15 minutes 55 seconds  Total Procedure Duration: 0 hours 18 minutes 11 seconds  Findings:      The perianal and digital rectal examinations were normal.      Non-bleeding internal hemorrhoids were found during endoscopy.      A 8 mm polyp was found in the cecum. The polyp was sessile. The polyp       was removed with a cold snare. Resection and retrieval were complete.      A 3 mm polyp was found in the ascending colon. The polyp was sessile.       The polyp was removed with a cold snare. Resection and retrieval were       complete.      A 10 mm polyp was found in the transverse colon. The polyp was sessile.       The polyp was removed with a cold snare. Resection and retrieval were       complete.      A 9 mm polyp was found in the recto-sigmoid colon. The polyp was       sessile. The polyp was removed with a cold snare. Resection and       retrieval were complete. Impression:               - Non-bleeding internal hemorrhoids.                           - One 8 mm polyp in the cecum, removed with a cold                            snare. Resected and retrieved.                           - One 3 mm polyp in the ascending colon, removed                            with a cold snare. Resected and retrieved.                           -  One 10 mm polyp in the transverse colon, removed                            with a cold snare. Resected and retrieved.                           - One 9 mm polyp at the recto-sigmoid colon,                            removed with a cold snare. Resected and retrieved. Moderate Sedation:      Per Anesthesia Care Recommendation:           - Patient has a contact number available for                            emergencies. The signs and symptoms of potential                             delayed complications were discussed with the                            patient. Return to normal activities tomorrow.                            Written discharge instructions were provided to the                            patient.                           - Resume previous diet.                           - Continue present medications.                           - Await pathology results.                           - Repeat colonoscopy in 3 - 5 years for                            surveillance.                           - Return to GI clinic in 6 months. Procedure Code(s):        --- Professional ---                           [phone], Colonoscopy, flexible; with removal of                            tumor(s), polyp(s), or other lesion(s) by snare                            technique Diagnosis Code(s):        ---  Professional ---                           Z12.11, Encounter for screening for malignant                            neoplasm of colon                           K63.5, Polyp of colon                           K64.8, Other hemorrhoids CPT copyright 2019 American Medical Association. All rights reserved. The codes documented in this report are preliminary and upon coder review may  be revised to meet current compliance requirements. Elon Alas. Abbey Chatters, DO [place] Abbey Chatters, DO 07/16/2021 9:34:07 AM This report has been signed electronically. Number of Addenda: 0

## 2021-07-16 NOTE — Anesthesia Preprocedure Evaluation (Signed)
Anesthesia Evaluation  Patient identified by MRN, date of birth, ID band Patient awake    Reviewed: Allergy & Precautions, NPO status , Patient's Chart, lab work & pertinent test results  Airway Mallampati: II  TM Distance: >3 FB Neck ROM: Full    Dental  (+) Dental Advisory Given, Loose, Missing, Poor Dentition,    Pulmonary COPD, Current Smoker and Patient abstained from smoking.,    Pulmonary exam normal breath sounds clear to auscultation       Cardiovascular + CAD and +CHF (PFO)  Normal cardiovascular exam+ dysrhythmias Atrial Fibrillation  Rhythm:Regular Rate:Normal  PFO 1. Left ventricular ejection fraction, by estimation, is 45 to 50%. The  left ventricle has mildly decreased function. The left ventricle demonstrates global hypokinesis.  2. Evidence of atrial level shunting detected by color flow Doppler.  Agitated saline contrast bubble study was positive with shunting observed  within 3-6 cardiac cycles suggestive of interatrial shunt. There is a  secundum atrial septal defect with  predominantly right to left shunting across the atrial septum. Diameter of  ASD is approximately 0.5cm. There is 1.8 cm landing zone on either side of  ASD. Normal coronary sinus/ PV drainage.  3. Right ventricular systolic function is moderately reduced. The right  ventricular size is mildly enlarged.  4. Left atrial size was moderately dilated. No left atrial/left atrial  appendage thrombus was detected.  5. Right atrial size was severely dilated.  6. The mitral valve is normal in structure. No evidence of mitral valve  regurgitation. No evidence of mitral stenosis.  7. The aortic valve is normal in structure. Aortic valve regurgitation is  not visualized. No aortic stenosis is present.  8. The inferior vena cava is normal in size with greater than 50%  respiratory variability, suggesting right atrial pressure of 3 mmHg.  9.  Secundum ASD.    Neuro/Psych PSYCHIATRIC DISORDERS Depression    GI/Hepatic negative GI ROS, (+) Cirrhosis     substance abuse  marijuana use,   Endo/Other  negative endocrine ROS  Renal/GU Renal disease     Musculoskeletal negative musculoskeletal ROS (+)   Abdominal   Peds  Hematology negative hematology ROS (+)   Anesthesia Other Findings   Reproductive/Obstetrics negative OB ROS                             Anesthesia Physical Anesthesia Plan  ASA: 3  Anesthesia Plan: General   Post-op Pain Management: Minimal or no pain anticipated   Induction: Intravenous  PONV Risk Score and Plan: TIVA  Airway Management Planned: Nasal Cannula and Natural Airway  Additional Equipment:   Intra-op Plan:   Post-operative Plan:   Informed Consent: I have reviewed the patients History and Physical, chart, labs and discussed the procedure including the risks, benefits and alternatives for the proposed anesthesia with the patient or authorized representative who has indicated his/her understanding and acceptance.     Dental advisory given  Plan Discussed with: CRNA and Surgeon  Anesthesia Plan Comments:         Anesthesia Quick Evaluation

## 2021-07-16 NOTE — Anesthesia Procedure Notes (Signed)
Date/Time: 07/16/2021 9:15 AM Performed by: Orlie Dakin, CRNA Pre-anesthesia Checklist: Patient identified, Emergency Drugs available, Suction available and Patient being monitored Patient Re-evaluated:Patient Re-evaluated prior to induction Oxygen Delivery Method: Nasal cannula Induction Type: IV induction Placement Confirmation: positive ETCO2

## 2021-07-16 NOTE — Anesthesia Postprocedure Evaluation (Signed)
Anesthesia Post Note  Patient: Jordan Kelly  Procedure(s) Performed: COLONOSCOPY WITH PROPOFOL POLYPECTOMY  Patient location during evaluation: Phase II Anesthesia Type: General Level of consciousness: awake and alert and oriented Pain management: pain level controlled Vital Signs Assessment: post-procedure vital signs reviewed and stable Respiratory status: spontaneous breathing, nonlabored ventilation and respiratory function stable Cardiovascular status: blood pressure returned to baseline and stable Postop Assessment: no apparent nausea or vomiting Anesthetic complications: no   No notable events documented.   Last Vitals:  Vitals:   07/16/21 0851 07/16/21 0934  BP: 112/73 (!) 92/52  Pulse: 77 78  Resp: 20 16  Temp: 36.5 C 36.7 C  SpO2: 96% 97%    Last Pain:  Vitals:   07/16/21 0934  TempSrc: Axillary  PainSc: 0-No pain                 Merrick Maggio C Mckinlee Dunk

## 2021-07-16 NOTE — Discharge Instructions (Addendum)
  Colonoscopy Discharge Instructions  Read the instructions outlined below and refer to this sheet in the next few weeks. These discharge instructions provide you with general information on caring for yourself after you leave the hospital. Your doctor may also give you specific instructions. While your treatment has been planned according to the most current medical practices available, unavoidable complications occasionally occur.   ACTIVITY You may resume your regular activity, but move at a slower pace for the next 24 hours.  Take frequent rest periods for the next 24 hours.  Walking will help get rid of the air and reduce the bloated feeling in your belly (abdomen).  No driving for 24 hours (because of the medicine (anesthesia) used during the test).   Do not sign any important legal documents or operate any machinery for 24 hours (because of the anesthesia used during the test).  NUTRITION Drink plenty of fluids.  You may resume your normal diet as instructed by your doctor.  Begin with a light meal and progress to your normal diet. Heavy or fried foods are harder to digest and may make you feel sick to your stomach (nauseated).  Avoid alcoholic beverages for 24 hours or as instructed.  MEDICATIONS You may resume your normal medications unless your doctor tells you otherwise.  WHAT YOU CAN EXPECT TODAY Some feelings of bloating in the abdomen.  Passage of more gas than usual.  Spotting of blood in your stool or on the toilet paper.  IF YOU HAD POLYPS REMOVED DURING THE COLONOSCOPY: No aspirin products for 7 days or as instructed.  No alcohol for 7 days or as instructed.  Eat a soft diet for the next 24 hours.  FINDING OUT THE RESULTS OF YOUR TEST Not all test results are available during your visit. If your test results are not back during the visit, make an appointment with your caregiver to find out the results. Do not assume everything is normal if you have not heard from your  caregiver or the medical facility. It is important for you to follow up on all of your test results.  SEEK IMMEDIATE MEDICAL ATTENTION IF: You have more than a spotting of blood in your stool.  Your belly is swollen (abdominal distention).  You are nauseated or vomiting.  You have a temperature over 101.  You have abdominal pain or discomfort that is severe or gets worse throughout the day.   Your colonoscopy revealed 4 polyp(s) which I removed successfully. Await pathology results, my office will contact you. I recommend repeating colonoscopy in 3-5 years for surveillance purposes. Follow up with GI in 6 months.    I hope you have a great rest of your week!  Elon Alas. Abbey Chatters, D.O. Gastroenterology and Hepatology [facility]

## 2021-07-16 NOTE — Transfer of Care (Signed)
Immediate Anesthesia Transfer of Care Note  Patient: Jordan Kelly  Procedure(s) Performed: COLONOSCOPY WITH PROPOFOL POLYPECTOMY  Patient Location: Short Stay  Anesthesia Type:General  Level of Consciousness: awake  Airway & Oxygen Therapy: Patient Spontanous Breathing  Post-op Assessment: Report given to RN and Post -op Vital signs reviewed and stable  Post vital signs: Reviewed and stable  Last Vitals:  Vitals Value Taken Time  BP 92/52 07/16/21 0934  Temp 36.7 C 07/16/21 0934  Pulse 78 07/16/21 0934  Resp 16 07/16/21 0934  SpO2 97 % 07/16/21 0934    Last Pain:  Vitals:   07/16/21 0934  TempSrc: Axillary  PainSc: 0-No pain      Patients Stated Pain Goal: 4 (50/09/38 1829)  Complications: No notable events documented.

## 2021-07-17 LAB — SURGICAL PATHOLOGY

## 2021-07-19 ENCOUNTER — Encounter (HOSPITAL_COMMUNITY): Payer: Self-pay | Admitting: Internal Medicine

## 2021-08-09 ENCOUNTER — Other Ambulatory Visit: Payer: Self-pay | Admitting: Student

## 2021-08-09 NOTE — Telephone Encounter (Signed)
Prescription refill request for Eliquis received. Indication: Atrial fib Last office visit: 02/21/21  B Strader PA-C Scr: 0.69 on 04/05/21 Age: 60 Weight: 73kg  Based on above findings Eliquis 5mg  twice daily is the appropriate dose.  Refill approved.

## 2021-09-03 ENCOUNTER — Ambulatory Visit (INDEPENDENT_AMBULATORY_CARE_PROVIDER_SITE_OTHER): Payer: PRIVATE HEALTH INSURANCE | Admitting: Nurse Practitioner

## 2021-09-03 ENCOUNTER — Encounter: Payer: Self-pay | Admitting: Nurse Practitioner

## 2021-09-03 ENCOUNTER — Other Ambulatory Visit: Payer: Self-pay

## 2021-09-03 VITALS — BP 124/80 | HR 59 | Temp 97.2°F | Resp 17 | Ht 70.0 in | Wt 163.0 lb

## 2021-09-03 DIAGNOSIS — J029 Acute pharyngitis, unspecified: Secondary | ICD-10-CM | POA: Diagnosis not present

## 2021-09-03 DIAGNOSIS — Z2821 Immunization not carried out because of patient refusal: Secondary | ICD-10-CM | POA: Insufficient documentation

## 2021-09-03 DIAGNOSIS — F322 Major depressive disorder, single episode, severe without psychotic features: Secondary | ICD-10-CM

## 2021-09-03 DIAGNOSIS — J439 Emphysema, unspecified: Secondary | ICD-10-CM

## 2021-09-03 DIAGNOSIS — J0191 Acute recurrent sinusitis, unspecified: Secondary | ICD-10-CM | POA: Diagnosis not present

## 2021-09-03 DIAGNOSIS — I4891 Unspecified atrial fibrillation: Secondary | ICD-10-CM

## 2021-09-03 MED ORDER — SALINE SPRAY 0.65 % NA SOLN: 1.0000 | NASAL | 0 refills | Status: DC | PRN

## 2021-09-03 MED ORDER — AZITHROMYCIN 250 MG PO TABS: ORAL_TABLET | ORAL | 0 refills | Status: AC

## 2021-09-03 NOTE — Progress Notes (Signed)
° °  Jordan Kelly     MRN: [id number]      DOB: 07/11/1961   HPI Jordan Kelly is here for complaints of nasal congestion, running nose , symptoms started since last week Thursday. He has sore throat, scratchy throat, hoarsenesshe had a few body aches, little wheezing when he lays down. . Pt denies fever, chills,  cp , cough, sob., sinus pressure, sneezing.  Smokes 4-5 cigarettes daily. He has not had his flu vaccine and covid booster. Pt advised to get both vaccines once he feels better, he verbalized understanding.   Patient stated that he recently lost his job and he is depressed about it  Patient denies suicidal ideation or homicidal ideation patient refuses referral to psych patient refused medications.   ROS Denies recent fever or chills.has   Has sinus pressure, nasal congestion, sore throat. Denies chest congestion, productive cough or has wheezing. Denies chest pains, palpitations and leg swelling Denies abdominal pain, nausea, vomiting,diarrhea or constipation.   Denies headaches, seizures, numbness, or tingling. Has  depression, no  anxiety or insomnia.    PE  BP 124/80    Pulse (!) 59    Resp 17    Ht 5\' 10"  (1.778 m)    Wt 163 lb (73.9 kg)    SpO2 96%    BMI 23.39 kg/m   Patient alert and oriented and in no cardiopulmonary distress.  HEENT: No facial asymmetry, EOMI,     Neck supple , examination of the mouth and throat without abnormal findings   Chest: Clear to auscultation bilaterally.  CVS: S1, S2 no murmurs, no S3.Regular rate.  ABD: Soft non tender.   Ext: No edema  MS: Adequate ROM spine, shoulders, hips and knees.  Psych: Good eye contact, normal affect. Memory intact not anxious. Pt is  depressed appearing.     Assessment & Plan

## 2021-09-03 NOTE — Assessment & Plan Note (Signed)
Need for flu vaccine discussed with patient patient refused flu vaccine today.  Patient encouraged to get flu vaccine at his pharamcy or  get it from the clinic here. He verbalized understanding.

## 2021-09-03 NOTE — Assessment & Plan Note (Signed)
Well controlled. Pt counseled on the need to quit smoking , he verbalis ed understanding.

## 2021-09-03 NOTE — Patient Instructions (Addendum)
Please take azithromycin as instructed for your sinusitis. Please get your shingles vaccine, covid booster at your pharmacy  Drink warm fluids to help your sore throat.    It is important that you exercise regularly at least 30 minutes 5 times a week.  Think about what you will eat, plan ahead. Choose " clean, green, fresh or frozen" over canned, processed or packaged foods which are more sugary, salty and fatty. 70 to 75% of food eaten should be vegetables and fruit. Three meals at set times with snacks allowed between meals, but they must be fruit or vegetables. Aim to eat over a 12 hour period , example 7 am to 7 pm, and STOP after  your last meal of the day. Drink water,generally about 64 ounces per day, no other drink is as healthy. Fruit juice is best enjoyed in a healthy way, by EATING the fruit.  Thanks for choosing [facility], we consider it a privelige to serve you.

## 2021-09-03 NOTE — Assessment & Plan Note (Addendum)
Takes bisoprolol 5mg  daily Takes eliqius 5 mg bid followed by cardiology.  Labs ordered today, states he is not sure if he can get the labs done due to problems with  insurance.

## 2021-09-03 NOTE — Assessment & Plan Note (Addendum)
PHQ 17 , patient stated that his depression is due to his loss of job. Patient refuses referral to psych, patient refused medications for depression, patient denies suicidal ideation or homicidal ideation. States that he is going to try and get another job.  Emotional support offered to the pt.

## 2021-09-03 NOTE — Assessment & Plan Note (Addendum)
Throat was swabbed for strep. Test was negative. Drink warm fluids. Use salt gargles as needed.  Patient will call the office if symptoms does not get.

## 2021-09-03 NOTE — Assessment & Plan Note (Addendum)
zpack ordered.  Use saline nasal spray prn

## 2021-09-04 LAB — POCT RAPID STREP A (OFFICE): Rapid Strep A Screen: NEGATIVE

## 2021-09-07 ENCOUNTER — Telehealth: Payer: Self-pay | Admitting: *Deleted

## 2021-09-07 NOTE — Chronic Care Management (AMB) (Signed)
°  Care Management   Note  09/07/2021 Name: Jordan Kelly MRN: [id number] DOB: 09/06/1960  Jordan Kelly is a 61 y.o. year old male who is a primary care patient of Renee Rival, FNP and is actively engaged with the care management team. I reached out to Galen Daft by phone today to assist with re-scheduling a follow up visit with the RN Case Manager.   Follow up plan: Unsuccessful telephone outreach attempt made. A HIPAA compliant phone message was left for the patient providing contact information and requesting a return call.  The care management team will reach out to the patient again over the next 7 days.  If patient returns call to provider office, please advise to call [place]  at [phone].  [place] Management  Direct Dial: [phone]

## 2021-09-14 NOTE — Chronic Care Management (AMB) (Signed)
°  Care Management   Note  09/14/2021 Name: AADI BORDNER MRN: [id number] DOB: [birth year]  Jordan Kelly is a 61 y.o. year old male who is a primary care patient of Renee Rival, FNP and is actively engaged with the care management team. I reached out to Jordan Kelly by phone today to assist with re-scheduling a follow up visit with the RN Case Manager  Follow up plan: A second unsuccessful telephone outreach attempt made. The care management team will reach out to the patient again over the next 7 days. If patient returns call to provider office, please advise to call [place] at [phone].  [place] Management  Direct Dial: [phone]

## 2021-09-21 NOTE — Chronic Care Management (AMB) (Signed)
°  Care Management   Note  09/21/2021 Name: Jordan Kelly MRN: [id number] DOB: 11/23/1960  Jordan Kelly is a 61 y.o. year old male who is a primary care patient of Renee Rival, FNP and is actively engaged with the care management team. I reached out to Jordan Kelly by phone today to assist with re-scheduling a follow up visit with the RN Case Manager  Follow up plan: Telephone appointment with care management team member scheduled for:10/09/21  [place], [place] Management  Direct Dial: [phone]

## 2021-09-25 ENCOUNTER — Telehealth: Payer: 59

## 2021-10-09 ENCOUNTER — Ambulatory Visit: Payer: Self-pay | Admitting: *Deleted

## 2021-10-09 ENCOUNTER — Other Ambulatory Visit: Payer: Self-pay

## 2021-10-09 DIAGNOSIS — I509 Heart failure, unspecified: Secondary | ICD-10-CM

## 2021-10-09 DIAGNOSIS — I484 Atypical atrial flutter: Secondary | ICD-10-CM

## 2021-10-09 DIAGNOSIS — I50813 Acute on chronic right heart failure: Secondary | ICD-10-CM

## 2021-10-09 DIAGNOSIS — K7030 Alcoholic cirrhosis of liver without ascites: Secondary | ICD-10-CM

## 2021-10-09 NOTE — Chronic Care Management (AMB) (Signed)
Care Management    RN Visit Note  10/09/2021 Name: Jordan Kelly MRN: [id number] DOB: [birth year]  Subjective: Jordan Kelly is a 61 y.o. year old male who is a primary care patient of Renee Rival, FNP. The care management team was consulted for assistance with disease management and care coordination needs.    Engaged with patient by telephone for follow up visit in response to provider referral for case management and/or care coordination services.   Consent to Services:   Jordan Kelly was given information about Care Management services today including:  Care Management services includes personalized support from designated clinical staff supervised by his physician, including individualized plan of care and coordination with other care providers 24/7 contact phone numbers for assistance for urgent and routine care needs. The patient may stop case management services at any time by phone call to the office staff.  Patient agreed to services and consent obtained.   Assessment: Review of patient past medical history, allergies, medications, health status, including review of consultants reports, laboratory and other test data, was performed as part of comprehensive evaluation and provision of chronic care management services.   SDOH (Social Determinants of Health) assessments and interventions performed:    Care Plan  No Known Allergies  Outpatient Encounter Medications as of 10/09/2021  Medication Sig   albuterol (VENTOLIN HFA) 108 (90 Base) MCG/ACT inhaler Inhale 2 puffs into the lungs every 6 (six) hours as needed for wheezing or shortness of breath.   apixaban (ELIQUIS) 5 MG TABS tablet TAKE (1) TABLET BY MOUTH TWICE DAILY.   bisoprolol (ZEBETA) 5 MG tablet Take 1 tablet (5 mg total) by mouth daily.   diazePAM (VALIUM PO) Take 2.5 mg by mouth daily.   fluticasone-salmeterol (ADVAIR DISKUS) 250-50 MCG/ACT AEPB Inhale 1 puff into the lungs in the morning and at bedtime.    Multiple Vitamin (MULTIVITAMIN WITH MINERALS) TABS tablet Take 1 tablet by mouth daily.   sodium chloride (OCEAN) 0.65 % SOLN nasal spray Place 1 spray into both nostrils as needed for congestion.   tetrahydrozoline-zinc (VISINE-AC) 0.05-0.25 % ophthalmic solution Place 1-2 drops into both eyes 3 (three) times daily as needed (dry/irritated eyes.).   torsemide (DEMADEX) 20 MG tablet Take 1 tablet (20 mg total) by mouth as needed.   No facility-administered encounter medications on file as of 10/09/2021.    Patient Active Problem List   Diagnosis Date Noted   Sore throat 09/03/2021   Acute recurrent sinusitis 09/03/2021   Depression, major, single episode, severe ([place]) 09/03/2021   Refused influenza vaccine 09/03/2021   CAD (coronary artery disease) 06/11/2021   Left ear impacted cerumen 03/20/2021   Tinea cruris 03/13/2021   A-fib ([place]) 03/13/2021   Abnormal liver diagnostic imaging 12/12/2020   Pulmonary hypertension, primary ([place]) 11/28/2020   PFO (patent foramen ovale) 11/14/2020   Cirrhosis ([place]) 11/14/2020   Psoriasis 11/14/2020   AKI (acute kidney injury) ([place]) 11/14/2020   CHF (congestive heart failure) ([place]) 11/05/2020   Congestive heart failure (CHF) ([place]) 11/04/2020   Encounter to establish care 10/30/2020   Emphysema/COPD ([place]) 10/30/2020   Constipation 10/30/2020   Nausea 10/30/2020   Tobacco abuse 01/09/2019   Alcohol abuse 01/09/2019    Conditions to be addressed/monitored: CHF and Cirrhosis  Care Plan : RN Care Manager plan of care  Updates made by Kassie Mends, RN since 10/09/2021 12:00 AM     Problem: No plan of care established for management of chronic disease states (Fatty  liver/ cirrhosis, CHF)   Priority: High     Long-Range Goal: Development of plan of care for chronic disease management (Fatty liver/ cirrhosis, CHF)   Start Date: 06/26/2021  Expected End Date: 12/24/2021  Priority: High  Note:    Current Barriers:  Knowledge Deficits related  to plan of care for management of CHF and fatty liver/ cirrhosis.  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 Patient reports he continues to weigh daily with weight the same at 160 pounds. Ineffective Self Health Maintenance - Does not have HCPOA and living will in place, not interested at present Financial concerns- pt is currently working, now has new insurance [place] ([place] left the state per pt) and pt states he is still going to his primary care provider office but they do not accept his insurance so he will be billed until he finds another doctor, pt requests a list of doctors in the area. Rides a moped and sister transports patient to out of town appointments, does not feel he needs to utilize [facility] transportation at present, pt is getting out to walk his dog several times per week, reports " I gave up smoking"  had ultrasound (liver) and states good report, continues to follow up with GI doctor. Has eliquis as is able to afford at present  [place]):  Patient will attend all scheduled appointments - colonoscopy 07/16/21, as evidenced by pt report, review of EHR through collaboration with RN Care manager, provider, and care team.  Patient will take all medications as prescribed as evidenced by pt report, review of EHR Patient will verbalize understanding of CHF, fatty liver/ cirrhosis with continued self care as evidenced by pt report and review of EHR.  Interventions: 1:1 collaboration with primary care provider regarding development and update of comprehensive plan of care as evidenced by provider attestation and  co-signature Inter-disciplinary care team collaboration (see longitudinal plan of care) Evaluation of current treatment plan related to  self management and patient's adherence to plan as established by provider  Fatty liver/cirrhosis Interventions: Goal on track:  Yes Evaluation of current treatment plan related to  cirrhosis , self-management and patient's adherence to plan as established by provider. Discussed plans with patient for ongoing care management follow up and provided patient with direct contact information for care management team Provided education to patient re: eating healthy, getting outside daily and contiuing to walk Reviewed importance of continued smoking cessation and consuming no alcohol Pain assessment completed Reviewed upcoming scheduled appointments Referral to care guide to have list of doctor's in [facility] mailed to patient's home Instructed pt to call number on back of his insurance card and ask what providers are in network  Heart Failure Interventions: Provided patient with education about the role of exercise in the management of heart failure;  Reviewed heart failure action plan with emphasis on yellow zone Reviewed all medications and importance of taking as prescribed Reviewed low sodium diet and reading food labels  Patient Goals/Self-Care Activities: Patient will self administer medications as prescribed as evidenced by self report/primary caregiver report  Patient will attend all scheduled provider appointments as evidenced by clinician review of documented attendance to scheduled appointments and patient/caregiver report  Patient will continue to perform ADL's independently as evidenced by patient/caregiver report Patient will continue to perform IADL's independently as evidenced by patient/caregiver report Patient will call provider office for new concerns or questions as evidenced by review of documented incoming telephone call notes and  patient report Continue to weigh daily and record Call your doctor for weight gain 3 pounds overnight or 5 pounds in one week Follow low salt diet- read labels for sodium content Avoid salty snacks, fast food Take all medications as prescribed Try to avoid all tobacco products and alcohol  Continue to get outside daily and walk Call number on back of your insurance card and ask what providers are in network A list of doctors in your area will be mailed to your home       Plan: Telephone follow up appointment with care management team member scheduled for:  12/18/21  Jacqlyn Larsen [facility], BSN RN Case Manager [facility] [phone]

## 2021-10-09 NOTE — Patient Instructions (Signed)
Visit Information  Thank you for taking time to visit with me today. Please don't hesitate to contact me if I can be of assistance to you before our next scheduled telephone appointment.  Following are the goals we discussed today:  Patient will self administer medications as prescribed as evidenced by self report/primary caregiver report  Patient will attend all scheduled provider appointments as evidenced by clinician review of documented attendance to scheduled appointments and patient/caregiver report Patient will continue to perform ADL's independently as evidenced by patient/caregiver report Patient will continue to perform IADL's independently as evidenced by patient/caregiver report Patient will call provider office for new concerns or questions as evidenced by review of documented incoming telephone call notes and patient report Continue to weigh daily and record Call your doctor for weight gain 3 pounds overnight or 5 pounds in one week Follow low salt diet- read labels for sodium content Avoid salty snacks, fast food Take all medications as prescribed Try to avoid all tobacco products and alcohol  Continue to get outside daily and walk Call number on back of your insurance card and ask what providers are in network A list of doctors in your area will be mailed to your home  Our next appointment is by telephone on 12/18/21 at 9 am  Please call the care guide team at [phone] if you need to cancel or reschedule your appointment.   If you are experiencing a Mental Health or [place] or need someone to talk to, please call the Suicide and Crisis Lifeline: 988 call the [place] National Suicide Prevention Lifeline: 318-347-0616 or TTY: 825-177-2357 TTY (917)881-9968) to talk to a trained counselor call 1-800-273-TALK (toll free, 24 hour hotline) go to [facility] [address] [place] [phone]) call the [place]: [phone] call 911   The patient verbalized understanding of instructions, educational materials, and care plan provided today and declined offer to receive copy of patient instructions, educational materials, and care plan.   Telephone follow up appointment with care management team member scheduled for:  12/18/21  Jacqlyn Larsen [facility], BSN RN Case Manager [facility] [phone]

## 2021-10-10 ENCOUNTER — Telehealth: Payer: Self-pay | Admitting: *Deleted

## 2021-10-10 NOTE — Telephone Encounter (Signed)
? ?  Telephone encounter was:  Unsuccessful.  10/10/2021 ?Name: Jordan Kelly MRN: [id number] DOB: 03/05/1961 ? ?Unsuccessful outbound call made today to assist with:  PCP  ? ?Outreach Attempt:  1st Attempt ? ?Tried to call pt, no answer, voicemail full ? ?Ferman Basilio Greenauer -Selinda Eon ?Care Guide , Embedded Care Coordination ?[place], Care Management  ?[phone] ?[address] , [place] [address] ?Email : [email or web address] ?  ?

## 2021-10-11 ENCOUNTER — Telehealth: Payer: Self-pay | Admitting: *Deleted

## 2021-10-11 NOTE — Telephone Encounter (Signed)
? ?  Telephone encounter was:  Unsuccessful.  10/11/2021 ?Name: Jordan Kelly MRN: [id number] DOB: [birth year] ? ?Unsuccessful outbound call made today to assist with:  Will mail a list of PCP providers in [place] for his insurance plan  ? ?Outreach Attempt:  3rd Attempt.  Referral closed unable to contact patient. ? ? ?Will mail a list of PCP providers in [place] for his insurance plan  ? ?Lovett Sox -Selinda Eon ?Care Guide , Embedded Care Coordination ?[place], Care Management  ?[phone] ?[address] , [place] [address] ?Email : [email or web address] ?  ?

## 2021-10-11 NOTE — Telephone Encounter (Signed)
? ?  Telephone encounter was:  Unsuccessful.  10/11/2021 ?Name: Jordan Kelly MRN: [id number] DOB: [birth year] ? ?Unsuccessful outbound call made today to assist with:  PCP ? ?Outreach Attempt:  2nd Attempt ? ?Unable to leave message  ? ?Mahum Betten Greenauer -Selinda Eon ?Care Guide , Embedded Care Coordination ?[place], Care Management  ?[phone] ?[address] , [place] [address] ?Email : [email or web address] ?  ?

## 2021-11-05 ENCOUNTER — Ambulatory Visit: Payer: Self-pay | Admitting: Pulmonary Disease

## 2021-12-18 ENCOUNTER — Ambulatory Visit: Payer: PRIVATE HEALTH INSURANCE | Admitting: *Deleted

## 2021-12-18 DIAGNOSIS — K7030 Alcoholic cirrhosis of liver without ascites: Secondary | ICD-10-CM

## 2021-12-18 DIAGNOSIS — I50813 Acute on chronic right heart failure: Secondary | ICD-10-CM

## 2021-12-18 NOTE — Chronic Care Management (AMB) (Signed)
? Care Management ?  ? RN Visit Note ? ?12/18/2021 ?Name: Jordan Kelly MRN: [id number] DOB: Jun 04, 1961 ? ?Subjective: ?Jordan Kelly is a 61 y.o. year old male who is a primary care patient of Renee Rival, FNP. The care management team was consulted for assistance with disease management and care coordination needs.   ? ?Engaged with patient by telephone for follow up visit in response to provider referral for case management and/or care coordination services.  ? ?Consent to Services:  ? Mr. Jordan Kelly was given information about Care Management services today including:  ?Care Management services includes personalized support from designated clinical staff supervised by his physician, including individualized plan of care and coordination with other care providers ?24/7 contact phone numbers for assistance for urgent and routine care needs. ?The patient may stop case management services at any time by phone call to the office staff. ? ?Patient agreed to services and consent obtained.  ? ?Assessment: Review of patient past medical history, allergies, medications, health status, including review of consultants reports, laboratory and other test data, was performed as part of comprehensive evaluation and provision of chronic care management services.  ? ?SDOH (Social Determinants of Health) assessments and interventions performed:   ? ?Care Plan ? ?No Known Allergies ? ?Outpatient Encounter Medications as of 12/18/2021  ?Medication Sig  ? albuterol (VENTOLIN HFA) 108 (90 Base) MCG/ACT inhaler Inhale 2 puffs into the lungs every 6 (six) hours as needed for wheezing or shortness of breath.  ? apixaban (ELIQUIS) 5 MG TABS tablet TAKE (1) TABLET BY MOUTH TWICE DAILY.  ? bisoprolol (ZEBETA) 5 MG tablet Take 1 tablet (5 mg total) by mouth daily.  ? diazePAM (VALIUM PO) Take 2.5 mg by mouth daily.  ? fluticasone-salmeterol (ADVAIR DISKUS) 250-50 MCG/ACT AEPB Inhale 1 puff into the lungs in the morning and at bedtime.  ?  Multiple Vitamin (MULTIVITAMIN WITH MINERALS) TABS tablet Take 1 tablet by mouth daily.  ? sodium chloride (OCEAN) 0.65 % SOLN nasal spray Place 1 spray into both nostrils as needed for congestion.  ? tetrahydrozoline-zinc (VISINE-AC) 0.05-0.25 % ophthalmic solution Place 1-2 drops into both eyes 3 (three) times daily as needed (dry/irritated eyes.).  ? torsemide (DEMADEX) 20 MG tablet Take 1 tablet (20 mg total) by mouth as needed.  ? ?No facility-administered encounter medications on file as of 12/18/2021.  ? ? ?Patient Active Problem List  ? Diagnosis Date Noted  ? Sore throat 09/03/2021  ? Acute recurrent sinusitis 09/03/2021  ? Depression, major, single episode, severe ([place]) 09/03/2021  ? Refused influenza vaccine 09/03/2021  ? CAD (coronary artery disease) 06/11/2021  ? Left ear impacted cerumen 03/20/2021  ? Tinea cruris 03/13/2021  ? A-fib ([place]) 03/13/2021  ? Abnormal liver diagnostic imaging 12/12/2020  ? Pulmonary hypertension, primary ([place]) 11/28/2020  ? PFO (patent foramen ovale) 11/14/2020  ? Cirrhosis ([place]) 11/14/2020  ? Psoriasis 11/14/2020  ? AKI (acute kidney injury) ([place]) 11/14/2020  ? CHF (congestive heart failure) ([place]) 11/05/2020  ? Congestive heart failure (CHF) ([place]) 11/04/2020  ? Encounter to establish care 10/30/2020  ? Emphysema/COPD ([place]) 10/30/2020  ? Constipation 10/30/2020  ? Nausea 10/30/2020  ? Tobacco abuse 01/09/2019  ? Alcohol abuse 01/09/2019  ? ? ?Conditions to be addressed/monitored: CHF and Cirrhosis ? ?Care Plan : RN Care Manager plan of care  ?Updates made by Kassie Mends, RN since 12/18/2021 12:00 AM  ?  ? ?Problem: No plan of care established for management of chronic disease states (Fatty  liver/ cirrhosis, CHF)   ?Priority: High  ?  ? ?Long-Range Goal: Development of plan of care for chronic disease management (Fatty liver/ cirrhosis, CHF)   ?Start Date: 06/26/2021  ?Expected End Date: 06/16/2022  ?Priority: High  ?Note:   ? Current Barriers:  ?Knowledge Deficits related to  plan of care for management of CHF and fatty liver/ cirrhosis.  ?Knowledge deficit related to basic heart failure pathophysiology and self care management- needs reinforcement of CHF action plan ?Financial strain- patient is now working 30+ hours during the day and requests next outreach be around 430 pm as early morning appointments he does not have enough time to talk, continues to exercise daily and get outdoors. Patient reports he continues to weigh daily with weight the same at 160 pounds. ?Ineffective Self Health Maintenance - Does not have HCPOA and living will in place, not interested at present ?Financial concerns- pt is currently working, now has new insurance [employer] ([place] left the state per pt) and pt states he is still going to his primary care provider office and they do now accept his insurance. ?Rides a moped and sister transports patient to out of town appointments, does not feel he needs to utilize [facility] transportation at present, pt is getting out to walk his dog several times per week, reports " I gave up smoking"  had ultrasound (liver) and states good report, continues to follow up with GI doctor. ?Has eliquis as is able to afford at present ? ?RNCM Clinical Goal(s):  ?Patient will attend all scheduled appointments - colonoscopy 07/16/21, as evidenced by pt report, review of EHR through collaboration with RN Care manager, provider, and care team.  ?Patient will take all medications as prescribed as evidenced by pt report, review of EHR ?Patient will verbalize understanding of CHF, fatty liver/ cirrhosis with continued self care as evidenced by pt report and review of EHR. ? ?Interventions: ?1:1 collaboration with primary care provider regarding development and update of comprehensive plan of care as evidenced by provider attestation and co-signature ?Inter-disciplinary care team collaboration (see longitudinal plan of care) ?Evaluation of current treatment plan related to  self  management and patient's adherence to plan as established by provider ? ?Fatty liver/cirrhosis Interventions: ?Goal on track:  Yes ?Evaluation of current treatment plan related to  cirrhosis , self-management and patient's adherence to plan as established by provider. ?Provided education to patient re: reinforced eating healthy, getting outside daily and contiuing to walk ?Reinforced importance of continued smoking cessation and consuming no alcohol ?Pain assessment completed ?Reviewed upcoming scheduled appointments ? ?Heart Failure Interventions: ?Provided patient with education about the role of exercise in the management of heart failure;  ?Reinforced heart failure action plan with emphasis on yellow zone ?Reviewed all medications and importance of taking as prescribed ?Reinforced low sodium diet, reading food labels and limiting fast food ? ?Patient Goals/Self-Care Activities: ?Patient will self administer medications as prescribed as evidenced by self report/primary caregiver report  ?Patient will attend all scheduled provider appointments as evidenced by clinician review of documented attendance to scheduled appointments and patient/caregiver report ?Patient will continue to perform ADL's independently as evidenced by patient/caregiver report ?Patient will continue to perform IADL's independently as evidenced by patient/caregiver report ?Patient will call provider office for new concerns or questions as evidenced by review of documented incoming telephone call notes and patient report ?Continue to weigh daily and record, keep up the good work! ?Call your doctor for weight gain 3 pounds overnight or 5 pounds  in one week ?Follow low salt diet- read labels for sodium content ?Avoid salty snacks, limit fast food ?Take all medications as prescribed ?Try to avoid all tobacco products and alcohol  ?Continue to get outside daily and walk, find time to relax ? ? ?  ? ? ?Plan: Telephone follow up appointment with care  management team member scheduled for:  03/05/22 at 430 pm (per pt request needs late afternoon appointment) ? ?Jacqlyn Larsen RNC, BSN ?RN Case Manager ?[facility] ?[phone] ? ? ? ? ? ? ? ? ? ? ?

## 2021-12-18 NOTE — Patient Instructions (Signed)
Visit Information ? ?Thank you for taking time to visit with me today. Please don't hesitate to contact me if I can be of assistance to you before our next scheduled telephone appointment. ? ?Following are the goals we discussed today:  ?Patient will self administer medications as prescribed as evidenced by self report/primary caregiver report  ?Patient will attend all scheduled provider appointments as evidenced by clinician review of documented attendance to scheduled appointments and patient/caregiver report ?Patient will continue to perform ADL's independently as evidenced by patient/caregiver report ?Patient will continue to perform IADL's independently as evidenced by patient/caregiver report ?Patient will call provider office for new concerns or questions as evidenced by review of documented incoming telephone call notes and patient report ?Continue to weigh daily and record, keep up the good work! ?Call your doctor for weight gain 3 pounds overnight or 5 pounds in one week ?Follow low salt diet- read labels for sodium content ?Avoid salty snacks, limit fast food ?Take all medications as prescribed ?Try to avoid all tobacco products and alcohol  ?Continue to get outside daily and walk, find time to relax ? ?Our next appointment is by telephone on 03/05/22 at 430 pm ? ?Please call the care guide team at [phone] if you need to cancel or reschedule your appointment.  ? ?If you are experiencing a Mental Health or [place] or need someone to talk to, please call the Suicide and Crisis Lifeline: 988 ?call the [place] National Suicide Prevention Lifeline: (612) 200-2681 or TTY: 917-259-4177 TTY 440-378-7530) to talk to a trained counselor ?call 1-800-273-TALK (toll free, 24 hour hotline) ?go to [facility] [address] [place] [phone]) ?call the [facility]: [phone] ?call 911  ? ?The patient verbalized understanding of  instructions, educational materials, and care plan provided today and declined offer to receive copy of patient instructions, educational materials, and care plan.  ? ?Telephone follow up appointment with care management team member scheduled for: 03/05/22 at 430 pm ? ?Jacqlyn Larsen [facility], BSN ?RN Case Manager ?[place] ?[phone] ? ?

## 2022-01-01 ENCOUNTER — Ambulatory Visit: Payer: PRIVATE HEALTH INSURANCE | Admitting: Nurse Practitioner

## 2022-01-25 ENCOUNTER — Ambulatory Visit (INDEPENDENT_AMBULATORY_CARE_PROVIDER_SITE_OTHER): Payer: PRIVATE HEALTH INSURANCE | Admitting: Nurse Practitioner

## 2022-01-25 ENCOUNTER — Encounter: Payer: Self-pay | Admitting: Nurse Practitioner

## 2022-01-25 VITALS — BP 128/74 | HR 70 | Ht 70.0 in | Wt 164.0 lb

## 2022-01-25 DIAGNOSIS — J438 Other emphysema: Secondary | ICD-10-CM

## 2022-01-25 DIAGNOSIS — Z720 Tobacco use: Secondary | ICD-10-CM | POA: Diagnosis not present

## 2022-01-25 DIAGNOSIS — R351 Nocturia: Secondary | ICD-10-CM

## 2022-01-25 DIAGNOSIS — F322 Major depressive disorder, single episode, severe without psychotic features: Secondary | ICD-10-CM

## 2022-01-25 DIAGNOSIS — I509 Heart failure, unspecified: Secondary | ICD-10-CM

## 2022-01-25 DIAGNOSIS — R7303 Prediabetes: Secondary | ICD-10-CM

## 2022-01-25 DIAGNOSIS — G8929 Other chronic pain: Secondary | ICD-10-CM

## 2022-01-25 DIAGNOSIS — F1010 Alcohol abuse, uncomplicated: Secondary | ICD-10-CM | POA: Diagnosis not present

## 2022-01-25 DIAGNOSIS — M545 Low back pain, unspecified: Secondary | ICD-10-CM

## 2022-01-25 DIAGNOSIS — N401 Enlarged prostate with lower urinary tract symptoms: Secondary | ICD-10-CM | POA: Insufficient documentation

## 2022-01-25 MED ORDER — DICLOFENAC SODIUM 1 % EX GEL: 4.0000 g | Freq: Four times a day (QID) | CUTANEOUS | 0 refills | Status: DC

## 2022-01-25 MED ORDER — TAMSULOSIN HCL 0.4 MG PO CAPS: 0.4000 mg | ORAL_CAPSULE | Freq: Every day | ORAL | 3 refills | Status: DC

## 2022-01-25 NOTE — Assessment & Plan Note (Signed)
Rx diclofenac gel 4 g 4 times daily as needed to lower back Stretching exercise and use of heat encouraged

## 2022-01-25 NOTE — Assessment & Plan Note (Signed)
PHQ-9 score 11 Feels depressed about the job he is doing abdominal pain much states that he is looking for another job Refused reFERAL  for counseling Denies SI, HI Emotional support given

## 2022-01-25 NOTE — Assessment & Plan Note (Addendum)
Followed by cardiology Currently on Eliquis 5 mg twice daily, bisoprolol 5 mg daily Torsemide 20 mg daily as needed stated that he has not needed torsemide in a long Patient encouraged to maintain close follow-up with cardiology Check CMP, lipid panel

## 2022-01-25 NOTE — Assessment & Plan Note (Signed)
Drinks 1 to 2 cans of beer daily Need to quit drinking including confusion, worsening cirrhosis, intoxication.  Patient verbalized understanding

## 2022-01-25 NOTE — Progress Notes (Signed)
Jordan Kelly     MRN: [id number]      DOB: April 18, 1961   HPI Jordan Kelly with past medical history of alcoholic cirrhosis, CHF, CAD, COPD, tobacco abuse, alcohol abuse, depression is here for follow up and re-evaluation of chronic medical conditions, medication management   COPD . has some SOB and wheezing , has been using Advair as needed instead of twice daily, also using albuterol as needed, currently smokes 5 cigarettes daily, states that he used to smoke 2 packs a day.  Patient denies fever, chills, cough   Patient complain of low back  sharp pain that started about 3 months ago , standing on his feet for a long time makes his back hurt more, sleeping on his side makes his pain better pain does not radiate to his legs, denies numbness, tingling ,incontinence, trauma.  Patient states that he does a lot of standing bending and pulling at work. he has not tried taking any medication for his pain .   Alcohol use disorder drinks 1-2 cans of beer daily, denies abdominal pain, hematemesis  Patient complain of urinary hesitancy, frequency, low urinary stream since the past 3 months .  patient denies incontinence, blood in his urine, dysuria , no previous diagnosis of BPH.  Depression.  Patient stated that I am not happy with my job for the past 4 months , not paying like it should , denies SI, HI, refused counseeling.   Due for tetanus and shingles vaccine need for both vaccines discussed with patient patient refused vaccination today   ROS Denies recent fever or chills. Denies sinus pressure, nasal congestion, ear pain or sore throat. Denies chest congestion, productive cough or wheezing. Denies chest pains, palpitations and leg swelling Denies abdominal pain, nausea, vomiting,diarrhea or constipation.  . Denies headaches, seizures, numbness, or tingling.   PE  BP 128/74 (BP Location: Right Arm, Cuff Size: Normal)   Pulse 70   Ht '5\' 10"'$  (1.778 m)   Wt 164 lb (74.4 kg)   SpO2 97%    BMI 23.53 kg/m   Patient alert and oriented and in no cardiopulmonary distress.    Chest: Clear to auscultation bilaterally.  CVS: S1, S2 no murmurs, no S3.Regular rate.  ABD: Soft non tender.   Ext: No edema  MS: Adequate ROM shoulders, hips and knees.,  Tenderness on range of motion of the spine  Skin: Intact, no ulcerations or rash noted.  Psych: Good eye contact, normal affect. Memory intact not anxious or depressed appearing.  CNS: CN 2-12 intact, power,  normal throughout.no focal deficits noted.   Assessment & Plan  Emphysema/COPD [facility]) Chronic condition well-controlled Take albuterol 108 mcg inhaler 2 puffs every 6 hours as needed Take Advair discus 250-50 mcg, 1 puff twice daily Smoking cessation education completed  Tobacco abuse Smokes about 5 cigarettes/day  Asked about quitting: confirms that he/she currently smokes cigarettes Advise to quit smoking: Educated about QUITTING to reduce the risk of cancer, cardio and cerebrovascular disease. Assess willingness: Unwilling to quit at this time, but is working on cutting back. Assist with counseling and pharmacotherapy: Counseled for 5 minutes and literature provided. Arrange for follow up: follow up in 3 month and continue to offer help.  Alcohol abuse Drinks 1 to 2 cans of beer daily Need to quit drinking including confusion, worsening cirrhosis, intoxication.  Patient verbalized understanding  Depression, major, single episode, severe ([place]) PHQ-9 score 11 Feels depressed about the job he is doing abdominal pain much states  that he is looking for another job Refused reFERAL  for counseling Denies SI, HI Emotional support given  Prediabetes Check A1c avoid sugar sweets soda Lab Results  Component Value Date   HGBA1C 5.3 04/05/2021    Chronic bilateral low back pain without sciatica Rx diclofenac gel 4 g 4 times daily as needed to lower back Stretching exercise and use of heat encouraged  BPH  associated with nocturia Start Flomax 0.4 mg daily Check PSA Follow-up in 4 weeks  Congestive heart failure (CHF) ([facility]) Followed by cardiology Currently on Eliquis 5 mg twice daily, bisoprolol 5 mg daily Torsemide 20 mg daily as needed stated that he has not needed torsemide in a long Patient encouraged to maintain close follow-up with cardiology Check CMP, lipid panel

## 2022-01-25 NOTE — Patient Instructions (Signed)
Please take Flomax 0.'4mg'$  once daily.  Please apply Voltaren gel to your low back to help relive your back pain, use of heating pad is also going to be helpful    It is important that you exercise regularly at least 30 minutes 5 times a week.  Think about what you will eat, plan ahead. Choose " clean, green, fresh or frozen" over canned, processed or packaged foods which are more sugary, salty and fatty. 70 to 75% of food eaten should be vegetables and fruit. Three meals at set times with snacks allowed between meals, but they must be fruit or vegetables. Aim to eat over a 12 hour period , example 7 am to 7 pm, and STOP after  your last meal of the day. Drink water,generally about 64 ounces per day, no other drink is as healthy. Fruit juice is best enjoyed in a healthy way, by EATING the fruit.  Thanks for choosing [facility], we consider it a privelige to serve you.

## 2022-01-25 NOTE — Assessment & Plan Note (Signed)
Check A1c avoid sugar sweets soda Lab Results  Component Value Date   HGBA1C 5.3 04/05/2021

## 2022-01-25 NOTE — Assessment & Plan Note (Signed)
Start Flomax 0.4 mg daily Check PSA Follow-up in 4 weeks

## 2022-01-25 NOTE — Assessment & Plan Note (Signed)
Chronic condition well-controlled Take albuterol 108 mcg inhaler 2 puffs every 6 hours as needed Take Advair discus 250-50 mcg, 1 puff twice daily Smoking cessation education completed

## 2022-01-25 NOTE — Assessment & Plan Note (Signed)
Smokes about 5 cigarettes/day  Asked about quitting: confirms that he/she currently smokes cigarettes Advise to quit smoking: Educated about QUITTING to reduce the risk of cancer, cardio and cerebrovascular disease. Assess willingness: Unwilling to quit at this time, but is working on cutting back. Assist with counseling and pharmacotherapy: Counseled for 5 minutes and literature provided. Arrange for follow up: follow up in 3 month and continue to offer help.

## 2022-01-31 LAB — CMP14+EGFR
ALT: 16 IU/L (ref 0–44)
AST: 18 IU/L (ref 0–40)
Albumin/Globulin Ratio: 1.3 (ref 1.2–2.2)
Albumin: 4 g/dL (ref 3.8–4.9)
Alkaline Phosphatase: 122 IU/L — ABNORMAL HIGH (ref 44–121)
BUN/Creatinine Ratio: 14 (ref 9–20)
BUN: 8 mg/dL (ref 6–24)
Bilirubin Total: 0.9 mg/dL (ref 0.0–1.2)
CO2: 26 mmol/L (ref 20–29)
Calcium: 9.5 mg/dL (ref 8.7–10.2)
Chloride: 98 mmol/L (ref 96–106)
Creatinine, Ser: 0.58 mg/dL — ABNORMAL LOW (ref 0.76–1.27)
Globulin, Total: 3.2 g/dL (ref 1.5–4.5)
Glucose: 116 mg/dL — ABNORMAL HIGH (ref 70–99)
Potassium: 4.8 mmol/L (ref 3.5–5.2)
Sodium: 137 mmol/L (ref 134–144)
Total Protein: 7.2 g/dL (ref 6.0–8.5)
eGFR: 113 mL/min/{1.73_m2} (ref >59)

## 2022-01-31 LAB — HEMOGLOBIN A1C
Est. average glucose Bld gHb Est-mCnc: 108 mg/dL
Hgb A1c MFr Bld: 5.4 % (ref 4.8–5.6)

## 2022-01-31 LAB — LIPID PANEL
Chol/HDL Ratio: 1.6 ratio (ref 0.0–5.0)
Cholesterol, Total: 133 mg/dL (ref 100–199)
HDL: 83 mg/dL (ref >39)
LDL Chol Calc (NIH): 41 mg/dL (ref 0–99)
Triglycerides: 35 mg/dL (ref 0–149)
VLDL Cholesterol Cal: 9 mg/dL (ref 5–40)

## 2022-01-31 LAB — PSA: Prostate Specific Ag, Serum: 1.2 ng/mL (ref 0.0–4.0)

## 2022-01-31 NOTE — Progress Notes (Signed)
liver function is almost back to normal , avoid alcohol.   Other labs are normal Continue current medications

## 2022-02-27 ENCOUNTER — Ambulatory Visit (INDEPENDENT_AMBULATORY_CARE_PROVIDER_SITE_OTHER): Payer: PRIVATE HEALTH INSURANCE | Admitting: Nurse Practitioner

## 2022-02-27 ENCOUNTER — Encounter: Payer: Self-pay | Admitting: Nurse Practitioner

## 2022-02-27 VITALS — BP 132/80 | HR 65 | Ht 70.0 in | Wt 157.1 lb

## 2022-02-27 DIAGNOSIS — R109 Unspecified abdominal pain: Secondary | ICD-10-CM | POA: Diagnosis not present

## 2022-02-27 DIAGNOSIS — Z720 Tobacco use: Secondary | ICD-10-CM | POA: Diagnosis not present

## 2022-02-27 DIAGNOSIS — N401 Enlarged prostate with lower urinary tract symptoms: Secondary | ICD-10-CM

## 2022-02-27 DIAGNOSIS — Z23 Encounter for immunization: Secondary | ICD-10-CM | POA: Diagnosis not present

## 2022-02-27 DIAGNOSIS — R351 Nocturia: Secondary | ICD-10-CM

## 2022-02-27 DIAGNOSIS — R10A3 Flank pain, bilateral: Secondary | ICD-10-CM | POA: Insufficient documentation

## 2022-02-27 LAB — POCT URINALYSIS DIP (CLINITEK)
Bilirubin, UA: NEGATIVE
Blood, UA: NEGATIVE
Glucose, UA: NEGATIVE mg/dL
Ketones, POC UA: NEGATIVE mg/dL
Leukocytes, UA: NEGATIVE
Nitrite, UA: NEGATIVE
POC PROTEIN,UA: 30 — AB
Spec Grav, UA: 1.02 (ref 1.010–1.025)
Urobilinogen, UA: 0.2 E.U./dL
pH, UA: 6 (ref 5.0–8.0)

## 2022-02-27 MED ORDER — TRAMADOL HCL 50 MG PO TABS: 50.0000 mg | ORAL_TABLET | Freq: Two times a day (BID) | ORAL | 0 refills | Status: AC | PRN

## 2022-02-27 NOTE — Progress Notes (Signed)
   Jordan Kelly     MRN: [id number]      DOB: Feb 21, 1961   HPI Jordan Kelly with past medical history of CHF, A-fib, CAD cirrhosis BPH is here for follow up for his bilateral low back pain.  Continues to have low back pain now getting worse , pain is 8/10, pain worse with sitting down and movement, he has not taken any medication for his pain , pain is achy sharp, pain is worse on the right side, moves from the low back  upwards , has burning  sensation in his testicles. He has been passing urine more easily since he started taking flomax. Denies fever, chills, blood in the urine , no pain with passing urine.  He denies recent trauma to his low back   Still smokes 5 cigarettes daily, smoking cessation education completed.      ROS Denies recent fever or chills. Denies sinus pressure, nasal congestion, ear pain or sore throat. Denies chest congestion, productive cough or wheezing. Denies chest pains, palpitations and leg swelling Denies abdominal pain, nausea, vomiting,diarrhea or constipation.   Denies dysuria, frequency, hesitancy or incontinence. Denies joint pain, swelling and limitation in mobility. Denies headaches, seizures, numbness, or tingling. Denies depression, anxiety or insomnia. Denies skin break down or rash.   PE  BP 132/80 (BP Location: Right Arm, Patient Position: Sitting, Cuff Size: Normal)   Pulse 65   Ht '5\' 10"'$  (1.778 m)   Wt 157 lb 1.3 oz (71.3 kg)   SpO2 96%   BMI 22.54 kg/m   Patient alert and oriented and in no cardiopulmonary distress.   Chest: Clear to auscultation bilaterally.  CVS: S1, S2 no murmurs, no S3.Regular rate.  ABD: Soft non tender.   Ext: No edema  MS: limited ROM spine, hips voiced tenderness on range of motion of the spine  Skin: Intact, no ulcerations or rash noted.  Psych: Good eye contact, normal affect. Memory intact not anxious or depressed appearing.  CNS: CN 2-12 intact, power,  normal throughout.no focal deficits  noted.   Assessment & Plan  BPH associated with nocturia Continue Flomax 0.4 mg daily States that he has been passing urine more easily with Flomax still feels like he is not emptying completely PSA negative, urinalysis negative today Urgent referral placed to urology  Bilateral flank pain Continues to have bilateral flank pain Cannot take NSAIDs due to taking Eliquis Start tramadol 50 mg twice daily as needed for 5 days.  I discussed with the patient that tramadol is just for short-term use only Patient told to stop taking Valium that he gets from his sister. Stat CT kidneys and abdomen ordered to rule out kidney stones If if test comes back normal I will refer patient to orthopedics.  Tobacco abuse Smokes 5 cigarettes daily smoking cessation education completed

## 2022-02-27 NOTE — Patient Instructions (Addendum)
Please get  your shingles vaccine TDAP vaccine today   Please take tramadol '50mg'$  two times daily as needed for pain. Get your CT scan done, you have been referred to urology   It is important that you exercise regularly at least 30 minutes 5 times a week.  Think about what you will eat, plan ahead. Choose " clean, green, fresh or frozen" over canned, processed or packaged foods which are more sugary, salty and fatty. 70 to 75% of food eaten should be vegetables and fruit. Three meals at set times with snacks allowed between meals, but they must be fruit or vegetables. Aim to eat over a 12 hour period , example 7 am to 7 pm, and STOP after  your last meal of the day. Drink water,generally about 64 ounces per day, no other drink is as healthy. Fruit juice is best enjoyed in a healthy way, by EATING the fruit.  Thanks for choosing [facility], we consider it a privelige to serve you.

## 2022-02-27 NOTE — Assessment & Plan Note (Signed)
Continue Flomax 0.4 mg daily States that he has been passing urine more easily with Flomax still feels like he is not emptying completely PSA negative, urinalysis negative today Urgent referral placed to urology

## 2022-02-27 NOTE — Assessment & Plan Note (Addendum)
Continues to have bilateral flank pain Cannot take NSAIDs due to taking Eliquis Start tramadol 50 mg twice daily as needed for 5 days.  I discussed with the patient that tramadol is just for short-term use only Patient told to stop taking Valium that he gets from his sister. Stat CT kidneys and abdomen ordered to rule out kidney stones If if test comes back normal I will refer patient to orthopedics.

## 2022-02-27 NOTE — Assessment & Plan Note (Signed)
Smokes 5 cigarettes daily smoking cessation education completed

## 2022-02-27 NOTE — Addendum Note (Signed)
Addended by: Quentin Angst on: 02/27/2022 09:16 AM   Modules accepted: Orders

## 2022-03-01 ENCOUNTER — Telehealth: Payer: Self-pay | Admitting: Nurse Practitioner

## 2022-03-01 ENCOUNTER — Other Ambulatory Visit: Payer: Self-pay

## 2022-03-01 NOTE — Telephone Encounter (Signed)
Called [facility] scheduling spoke with [name] she is working on getting in touch with Jordan Kelly or team lead to find out what exactly is needed called pt he is aware will contact him once corrected

## 2022-03-01 NOTE — Telephone Encounter (Signed)
Put in for authorization awaiting response

## 2022-03-01 NOTE — Telephone Encounter (Signed)
Patient calling to see when he needs to go have his CT scan done. Please give patient a call back

## 2022-03-01 NOTE — Telephone Encounter (Signed)
Jordan Kelly with [facility] health Radiology called in on patient behalf in regard to Stat CT order.  Patient needs new diagnosis code  ST stone study w/o contrast   Call back info  [place] [phone]

## 2022-03-05 ENCOUNTER — Other Ambulatory Visit: Payer: Self-pay | Admitting: Nurse Practitioner

## 2022-03-05 ENCOUNTER — Ambulatory Visit: Payer: PRIVATE HEALTH INSURANCE | Admitting: *Deleted

## 2022-03-05 ENCOUNTER — Ambulatory Visit (HOSPITAL_COMMUNITY): Payer: PRIVATE HEALTH INSURANCE

## 2022-03-05 DIAGNOSIS — K7030 Alcoholic cirrhosis of liver without ascites: Secondary | ICD-10-CM

## 2022-03-05 DIAGNOSIS — R109 Unspecified abdominal pain: Secondary | ICD-10-CM

## 2022-03-05 DIAGNOSIS — I509 Heart failure, unspecified: Secondary | ICD-10-CM

## 2022-03-05 NOTE — Chronic Care Management (AMB) (Signed)
Care Management    RN Visit Note  03/05/2022 Name: Jordan Kelly MRN: [id number] DOB: [birth year]  Subjective: Jordan Kelly is a 61 y.o. year old male who is a primary care patient of Jordan Rival, FNP. The care management team was consulted for assistance with disease management and care coordination needs.    Engaged with patient by telephone for follow up visit in response to provider referral for case management and/or care coordination services.   Consent to Services:   Jordan Kelly was given information about Care Management services today including:  Care Management services includes personalized support from designated clinical staff supervised by his physician, including individualized plan of care and coordination with other care providers 24/7 contact phone numbers for assistance for urgent and routine care needs. The patient may stop case management services at any time by phone call to the office staff.  Patient agreed to services and consent obtained.   Assessment: Review of patient past medical history, allergies, medications, health status, including review of consultants reports, laboratory and other test data, was performed as part of comprehensive evaluation and provision of chronic care management services.   SDOH (Social Determinants of Health) assessments and interventions performed:    Care Plan  No Known Allergies  Outpatient Encounter Medications as of 03/05/2022  Medication Sig Note   albuterol (VENTOLIN HFA) 108 (90 Base) MCG/ACT inhaler Inhale 2 puffs into the lungs every 6 (six) hours as needed for wheezing or shortness of breath.    apixaban (ELIQUIS) 5 MG TABS tablet TAKE (1) TABLET BY MOUTH TWICE DAILY.    bisoprolol (ZEBETA) 5 MG tablet Take 1 tablet (5 mg total) by mouth daily.    diazePAM (VALIUM PO) Take 2.5 mg by mouth daily.    diclofenac Sodium (VOLTAREN) 1 % GEL Apply 4 g topically 4 (four) times daily.    fluticasone-salmeterol (ADVAIR  DISKUS) 250-50 MCG/ACT AEPB Inhale 1 puff into the lungs in the morning and at bedtime. 01/25/2022: As needed   Multiple Vitamin (MULTIVITAMIN WITH MINERALS) TABS tablet Take 1 tablet by mouth daily.    sodium chloride (OCEAN) 0.65 % SOLN nasal spray Place 1 spray into both nostrils as needed for congestion.    tamsulosin (FLOMAX) 0.4 MG CAPS capsule Take 1 capsule (0.4 mg total) by mouth daily.    tetrahydrozoline-zinc (VISINE-AC) 0.05-0.25 % ophthalmic solution Place 1-2 drops into both eyes 3 (three) times daily as needed (dry/irritated eyes.).    torsemide (DEMADEX) 20 MG tablet Take 1 tablet (20 mg total) by mouth as needed.    No facility-administered encounter medications on file as of 03/05/2022.    Patient Active Problem List   Diagnosis Date Noted   Bilateral flank pain 02/27/2022   Prediabetes 01/25/2022   Chronic bilateral low back pain without sciatica 01/25/2022   BPH associated with nocturia 01/25/2022   Sore throat 09/03/2021   Acute recurrent sinusitis 09/03/2021   Depression, major, single episode, severe ([place]) 09/03/2021   Refused influenza vaccine 09/03/2021   CAD (coronary artery disease) 06/11/2021   Left ear impacted cerumen 03/20/2021   Tinea cruris 03/13/2021   A-fib ([place]) 03/13/2021   Abnormal liver diagnostic imaging 12/12/2020   Pulmonary hypertension, primary ([place]) 11/28/2020   PFO (patent foramen ovale) 11/14/2020   Cirrhosis ([place]) 11/14/2020   Psoriasis 11/14/2020   AKI (acute kidney injury) ([place]) 11/14/2020   Congestive heart failure (CHF) ([place]) 11/04/2020   Encounter to establish care 10/30/2020   Emphysema/COPD ([place]) 10/30/2020  Constipation 10/30/2020   Nausea 10/30/2020   Tobacco abuse 01/09/2019   Alcohol abuse 01/09/2019    Conditions to be addressed/monitored: CHF and Cirrhosis  Care Plan : RN Care Manager plan of care  Updates made by Jordan Mends, RN since 03/05/2022 12:00 AM     Problem: No plan of care established for management  of chronic disease states (Fatty liver/ cirrhosis, CHF)   Priority: High     Long-Range Goal: Development of plan of care for chronic disease management (Fatty liver/ cirrhosis, CHF)   Start Date: 06/26/2021  Expected End Date: 06/16/2022  Priority: High  Note:    Current Barriers:  Knowledge Deficits related to plan of care for management of CHF and fatty liver/ cirrhosis.  Knowledge deficit related to basic heart failure pathophysiology and self care management- needs reinforcement of CHF action plan Financial strain- patient is now working 30+ hours during the day and requests next outreach be around 430 pm as early morning appointments he does not have enough time to talk, continues to exercise daily and get outdoors. Patient reports he continues to weigh daily with weight at 156 pounds. Ineffective Self Health Maintenance - Does not have HCPOA and living will in place, not interested at present Financial concerns- pt is currently working, now has new insurance [place] the [place] per pt[place] and pt states he is still going to his primary care provider office and they do now accept his insurance. Rides a moped and sister transports patient to out of town appointments, does not feel he needs to utilize [facility] transportation at present, pt is getting out to walk his dog several times per week, reports " I gave up smoking"  had ultrasound (liver) and states good report, continues to follow up with GI doctor. Has eliquis as is able to afford at present and requests telephone contact number for future reference Patient reports he is having CT scan tomorrow (if/ when insurance approves) for lower back pain that pt thinks "is maybe a kidney stone", pt continues following up with doctor about back pain  RNCM Clinical Goal(s):  Patient will attend all scheduled appointments - colonoscopy 07/16/21, as evidenced by pt report, review of EHR through collaboration with RN Care manager,  provider, and care team.  Patient will take all medications as prescribed as evidenced by pt report, review of EHR Patient will verbalize understanding of CHF, fatty liver/ cirrhosis with continued self care as evidenced by pt report and review of EHR.  Interventions: 1:1 collaboration with primary care provider regarding development and update of comprehensive plan of care as evidenced by provider attestation and co-signature Inter-disciplinary care team collaboration (see longitudinal plan of care) Evaluation of current treatment plan related to  self management and patient's adherence to plan as established by provider  Fatty liver/cirrhosis Interventions: Goal on track:  Yes Evaluation of current treatment plan related to  cirrhosis , self-management and patient's adherence to plan as established by provider. Provided education to patient re: reinforced eating healthy, getting outside daily and contiuing to walk Reviewed importance of continued smoking cessation and consuming no alcohol Pain assessment completed Reviewed upcoming scheduled appointments Reviewed plan of care with patient including case closure today  Heart Failure Interventions: Provided patient with education about the role of exercise in the management of heart failure;  Reinforced heart failure action plan with emphasis on yellow zone Reviewed all medications and importance of taking as prescribed Reinforced low sodium diet, reading food labels  and limiting fast food Gave phone number for Eliquis savings card  Patient Goals/Self-Care Activities: Take medications as prescribed   Attend all scheduled provider appointments Perform all self care activities independently  Perform IADL's (shopping, preparing meals, housekeeping, managing finances) independently Call provider office for new concerns or questions  call office if I gain more than 2 pounds in one day or 5 pounds in one week watch for swelling in feet,  ankles and legs every day weigh myself daily follow rescue plan if symptoms flare-up track symptoms and what helps feel better or worse Continue to weigh daily and record, keep up the good work! Call your doctor for weight gain 3 pounds overnight or 5 pounds in one week Follow low salt diet- read labels for sodium content Avoid salty snacks, limit fast food Take all medications as prescribed Try to avoid all tobacco products and alcohol  Continue to get outside daily and walk, find time to relax Case closure today- please let your doctor know if you have any future care management needs       Plan: No further follow up required: case closure today  Jacqlyn Larsen [facility], BSN RN Case Manager [facility] [phone]

## 2022-03-05 NOTE — Patient Instructions (Signed)
Visit Information  Thank you for taking time to visit with me today. Please don't hesitate to contact me if I can be of assistance to you before our next scheduled telephone appointment.  Following are the goals we discussed today:  Take medications as prescribed   Attend all scheduled provider appointments Perform all self care activities independently  Perform IADL's (shopping, preparing meals, housekeeping, managing finances) independently Call provider office for new concerns or questions  call office if I gain more than 2 pounds in one day or 5 pounds in one week watch for swelling in feet, ankles and legs every day weigh myself daily follow rescue plan if symptoms flare-up track symptoms and what helps feel better or worse Continue to weigh daily and record, keep up the good work! Call your doctor for weight gain 3 pounds overnight or 5 pounds in one week Follow low salt diet- read labels for sodium content Avoid salty snacks, limit fast food Take all medications as prescribed Try to avoid all tobacco products and alcohol  Continue to get outside daily and walk, find time to relax Case closure today- please let your doctor know if you have any future care management needs  Please call the care guide team at [phone] if you need to cancel or reschedule your appointment.   If you are experiencing a Mental Health or [place] or need someone to talk to, please call the Suicide and Crisis Lifeline: 988 call the [place] National Suicide Prevention Lifeline: 249-583-6372 or TTY: 862-536-2540 TTY 386-336-5003) to talk to a trained counselor call 1-800-273-TALK (toll free, 24 hour hotline) go to [facility] [address], [place] [phone]) call the [place]: [phone] call 911   The patient verbalized understanding of instructions, educational materials, and care plan provided today and DECLINED  offer to receive copy of patient instructions, educational materials, and care plan.   No further follow up required: case closure  Jacqlyn Larsen [facility], BSN RN Case Manager [facility] [phone]

## 2022-03-06 ENCOUNTER — Other Ambulatory Visit: Payer: Self-pay

## 2022-03-06 ENCOUNTER — Emergency Department (HOSPITAL_COMMUNITY): Payer: PRIVATE HEALTH INSURANCE

## 2022-03-06 ENCOUNTER — Emergency Department (HOSPITAL_COMMUNITY)
Admission: EM | Admit: 2022-03-06 | Discharge: 2022-03-06 | Disposition: A | Discharge status: Home / Self Care | Payer: PRIVATE HEALTH INSURANCE | Attending: Emergency Medicine | Admitting: Emergency Medicine

## 2022-03-06 ENCOUNTER — Ambulatory Visit (HOSPITAL_COMMUNITY): Payer: PRIVATE HEALTH INSURANCE

## 2022-03-06 DIAGNOSIS — J449 Chronic obstructive pulmonary disease, unspecified: Secondary | ICD-10-CM | POA: Insufficient documentation

## 2022-03-06 DIAGNOSIS — Z7901 Long term (current) use of anticoagulants: Secondary | ICD-10-CM | POA: Diagnosis not present

## 2022-03-06 DIAGNOSIS — M545 Low back pain, unspecified: Secondary | ICD-10-CM | POA: Diagnosis present

## 2022-03-06 DIAGNOSIS — K4091 Unilateral inguinal hernia, without obstruction or gangrene, recurrent: Secondary | ICD-10-CM | POA: Insufficient documentation

## 2022-03-06 DIAGNOSIS — I509 Heart failure, unspecified: Secondary | ICD-10-CM | POA: Insufficient documentation

## 2022-03-06 DIAGNOSIS — M456 Ankylosing spondylitis lumbar region: Secondary | ICD-10-CM | POA: Insufficient documentation

## 2022-03-06 DIAGNOSIS — I2510 Atherosclerotic heart disease of native coronary artery without angina pectoris: Secondary | ICD-10-CM | POA: Diagnosis not present

## 2022-03-06 LAB — COMPREHENSIVE METABOLIC PANEL
ALT: 16 U/L (ref 0–44)
AST: 17 U/L (ref 15–41)
Albumin: 3.7 g/dL (ref 3.5–5.0)
Alkaline Phosphatase: 110 U/L (ref 38–126)
Anion gap: 7 (ref 5–15)
BUN: 10 mg/dL (ref 6–20)
CO2: 26 mmol/L (ref 22–32)
Calcium: 9 mg/dL (ref 8.9–10.3)
Chloride: 100 mmol/L (ref 98–111)
Creatinine, Ser: 0.59 mg/dL — ABNORMAL LOW (ref 0.61–1.24)
GFR, Estimated: >60 mL/min (ref >60)
Glucose, Bld: 86 mg/dL (ref 70–99)
Potassium: 4.6 mmol/L (ref 3.5–5.1)
Sodium: 133 mmol/L — ABNORMAL LOW (ref 135–145)
Total Bilirubin: 0.8 mg/dL (ref 0.3–1.2)
Total Protein: 7.7 g/dL (ref 6.5–8.1)

## 2022-03-06 LAB — URINALYSIS, ROUTINE W REFLEX MICROSCOPIC
Bilirubin Urine: NEGATIVE
Glucose, UA: NEGATIVE mg/dL
Ketones, ur: NEGATIVE mg/dL
Leukocytes,Ua: NEGATIVE
Nitrite: NEGATIVE
Protein, ur: NEGATIVE mg/dL
Specific Gravity, Urine: 1.006 (ref 1.005–1.030)
pH: 6 (ref 5.0–8.0)

## 2022-03-06 LAB — CBC
HCT: 44.7 % (ref 39.0–52.0)
Hemoglobin: 14.6 g/dL (ref 13.0–17.0)
MCH: 34.3 pg — ABNORMAL HIGH (ref 26.0–34.0)
MCHC: 32.7 g/dL (ref 30.0–36.0)
MCV: 104.9 fL — ABNORMAL HIGH (ref 80.0–100.0)
Platelets: 330 10*3/uL (ref 150–400)
RBC: 4.26 MIL/uL (ref 4.22–5.81)
RDW: 12.9 % (ref 11.5–15.5)
WBC: 9.8 10*3/uL (ref 4.0–10.5)
nRBC: 0 % (ref 0.0–0.2)

## 2022-03-06 LAB — LIPASE, BLOOD: Lipase: 37 U/L (ref 11–51)

## 2022-03-06 MED ORDER — HYDROCODONE-ACETAMINOPHEN 5-325 MG PO TABS: 1.0000 | ORAL_TABLET | ORAL | 0 refills | Status: DC | PRN

## 2022-03-06 MED ORDER — DEXAMETHASONE SODIUM PHOSPHATE 10 MG/ML IJ SOLN
10.0000 mg | Freq: Once | INTRAMUSCULAR | Status: AC
  Administered 2022-03-06: 10 mg via INTRAMUSCULAR
  Filled 2022-03-06: qty 1

## 2022-03-06 MED ORDER — PREDNISONE 10 MG PO TABS: ORAL_TABLET | ORAL | 0 refills | Status: DC

## 2022-03-06 NOTE — Discharge Instructions (Signed)
Take your next dose of prednisone tomorrow evening.  You may also take the hydrocodone as prescribed if needed for pain relief.  This medication will make you drowsy, do not drive within 4 hours of taking this medication.

## 2022-03-06 NOTE — ED Provider Notes (Signed)
[facility] EMERGENCY DEPARTMENT Provider Note   CSN: [id number] Arrival date & time: 03/06/22  1552     History  Chief Complaint  Patient presents with   Back Pain   Abdominal Pain    Jordan Kelly is a 61 y.o. male with a history significant for COPD, CAD, CHF, liver cirrhosis, history of atrial flutter on Eliquis surgical history including right inguinal hernia repair presenting for evaluation of of an approximate 45-monthhistory of bilateral low back pain with radiation into his lower abdomen, although he endorses right side is greater than left side.  He has nearly constant discomfort, describing a deep aching pain which is worsened with positional changes, sitting and attempts at standing upright.  He denies nausea or vomiting, diarrhea, dysuria, meals do not worsen or improve his symptoms.  He describes increased discomfort when he stands upright, he has noticed himself hunching over to try to avoid pain symptoms.  He denies history of back pain or injury.  He denies dysuria but until recently was having urinary retention which has improved since being placed on Flomax.  He denies history of kidney stones, however has been seen by his PCP on July 19 at which a CT scan was ordered but not yet completed looking for a stone.  He was prescribed a 5-day course of tramadol which has not relieved his symptoms, helped a little bit.  The history is provided by the patient.       Home Medications Prior to Admission medications   Medication Sig Start Date End Date Taking? Authorizing Provider  HYDROcodone-acetaminophen (NORCO/VICODIN) 5-325 MG tablet Take 1 tablet by mouth every 4 (four) hours as needed. 03/06/22  Yes Jamarquis Crull, JAlmyra Free PA-C  HYDROcodone-acetaminophen (NORCO/VICODIN) 5-325 MG tablet Take 1 tablet by mouth every 4 (four) hours as needed. 03/06/22  Yes Lucella Pommier, JAlmyra Free PA-C  predniSONE (DELTASONE) 10 MG tablet 6, 5, 4, 3, 2 then 1 tablet by mouth daily for 6 days total. 03/06/22  Yes Chanze Teagle,  JAlmyra Free PA-C  albuterol (VENTOLIN HFA) 108 (90 Base) MCG/ACT inhaler Inhale 2 puffs into the lungs every 6 (six) hours as needed for wheezing or shortness of breath. 01/22/21   SChesley Mires MD  apixaban (ELIQUIS) 5 MG TABS tablet TAKE (1) TABLET BY MOUTH TWICE DAILY. 08/09/21   Strader, BFransisco Hertz PA-C  bisoprolol (ZEBETA) 5 MG tablet Take 1 tablet (5 mg total) by mouth daily. 02/21/21   Strader, BFransisco Hertz PA-C  diazePAM (VALIUM PO) Take 2.5 mg by mouth daily.    [provider]  diclofenac Sodium (VOLTAREN) 1 % GEL Apply 4 g topically 4 (four) times daily. 01/25/22   Paseda, FDewaine Conger FNP  fluticasone-salmeterol (ADVAIR DISKUS) 250-50 MCG/ACT AEPB Inhale 1 puff into the lungs in the morning and at bedtime. 01/29/21   CSherren Mocha MD  Multiple Vitamin (MULTIVITAMIN WITH MINERALS) TABS tablet Take 1 tablet by mouth daily. 01/10/19   ERoxan Hockey MD  sodium chloride (OCEAN) 0.65 % SOLN nasal spray Place 1 spray into both nostrils as needed for congestion. 09/03/21   PRenee Rival FNP  tamsulosin (FLOMAX) 0.4 MG CAPS capsule Take 1 capsule (0.4 mg total) by mouth daily. 01/25/22   Paseda, FDewaine Conger FNP  tetrahydrozoline-zinc (VISINE-AC) 0.05-0.25 % ophthalmic solution Place 1-2 drops into both eyes 3 (three) times daily as needed (dry/irritated eyes.).    [provider]  torsemide (DEMADEX) 20 MG tablet Take 1 tablet (20 mg total) by mouth as needed. 02/21/21 06/06/21  Strader,  [place], PA-C      Allergies    Patient has no known allergies.    Review of Systems   Review of Systems  Constitutional:  Negative for fever.  HENT:  Negative for congestion and sore throat.   Eyes: Negative.   Respiratory:  Negative for chest tightness and shortness of breath.   Cardiovascular:  Negative for chest pain.  Gastrointestinal:  Positive for abdominal pain. Negative for nausea.  Genitourinary: Negative.   Musculoskeletal:  Positive for back pain. Negative for  arthralgias, joint swelling and neck pain.  Skin: Negative.  Negative for rash and wound.  Neurological:  Negative for dizziness, weakness, light-headedness, numbness and headaches.  Psychiatric/Behavioral: Negative.      Physical Exam Updated Vital Signs BP 128/72 (BP Location: Right Arm)   Pulse 72   Temp 98 F (36.7 C) (Oral)   Resp 16   Ht '5\' 10"'$  (1.778 m)   Wt 71.7 kg   SpO2 100%   BMI 22.67 kg/m  Physical Exam Vitals and nursing note reviewed.  Constitutional:      Appearance: He is well-developed.  HENT:     Head: Normocephalic and atraumatic.  Eyes:     Conjunctiva/sclera: Conjunctivae normal.  Neck:     Comments: Holding neck in forward flexion in standing position. Cardiovascular:     Rate and Rhythm: Normal rate and regular rhythm.     Heart sounds: Normal heart sounds.  Pulmonary:     Effort: Pulmonary effort is normal.     Breath sounds: Normal breath sounds. No wheezing.  Abdominal:     General: Bowel sounds are normal. There is no distension.     Palpations: Abdomen is soft. There is no hepatomegaly, splenomegaly or mass.     Tenderness: There is abdominal tenderness in the right lower quadrant. There is no guarding or rebound.  Musculoskeletal:        General: Normal range of motion.     Cervical back: Normal range of motion.  Skin:    General: Skin is warm and dry.  Neurological:     Mental Status: He is alert.     ED Results / Procedures / Treatments   Labs (all labs ordered are listed, but only abnormal results are displayed) Labs Reviewed  COMPREHENSIVE METABOLIC PANEL - Abnormal; Notable for the following components:      Result Value   Sodium 133 (*)    Creatinine, Ser 0.59 (*)    All other components within normal limits  CBC - Abnormal; Notable for the following components:   MCV 104.9 (*)    MCH 34.3 (*)    All other components within normal limits  URINALYSIS, ROUTINE W REFLEX MICROSCOPIC - Abnormal; Notable for the following  components:   Hgb urine dipstick SMALL (*)    Bacteria, UA RARE (*)    All other components within normal limits  LIPASE, BLOOD    EKG None  Radiology CT L-SPINE NO CHARGE  Result Date: 03/06/2022 CLINICAL DATA:  Initial evaluation for bilateral flank pain for 2 months. EXAM: CT LUMBAR SPINE WITHOUT CONTRAST TECHNIQUE: Multidetector CT imaging of the lumbar spine was performed without intravenous contrast administration. Multiplanar CT image reconstructions were also generated. RADIATION DOSE REDUCTION: This exam was performed according to the departmental dose-optimization program which includes automated exposure control, adjustment of the mA and/or kV according to patient size and/or use of iterative reconstruction technique. COMPARISON:  Concomitant CT of the abdomen and pelvis from the same  day. FINDINGS: Segmentation: Standard. Lowest well-formed disc space labeled the L5-S1 level. Alignment: Physiologic with preservation of the normal lumbar lordosis. No listhesis. Vertebrae: Vertebral body height maintained without acute or chronic fracture. Visualized sacrum and pelvis intact. Sclerosis involving the anterior inferior endplates of the visible vertebral bodies noted. Sclerotic change noted about the sacroiliitis joints bilaterally, slightly worse on the left. Changes predominantly involve the iliac side of the SI joints. No frank ankylosis. No discrete or worrisome osseous lesions. Paraspinal and other soft tissues: Paraspinous soft tissues demonstrate no acute finding. Aorto bi-iliac atherosclerotic disease. Scattered retroperitoneal lymph nodes noted, largest of which measures 1.5 cm (series 2, image 25). Disc levels: L1-2: Mild degenerative intervertebral disc space narrowing with disc bulge and endplate spurring. No spinal stenosis. Foramina remain patent. L2-3: Mild degenerative intervertebral disc space narrowing with diffuse disc bulge and endplate spurring. Mild facet hypertrophy. No  significant spinal stenosis. Foramina remain patent. L3-4: Degenerative intervertebral disc space narrowing with mild diffuse disc bulge, slightly asymmetric to the right. Moderate facet hypertrophy. Resultant mild bilateral subarticular stenosis. Central canal remains patent. No significant foraminal stenosis. L4-5: Mild diffuse disc bulge. Superimposed left extraforaminal disc protrusion closely approximates the exiting left L4 nerve root (series 2, image 68). Moderate facet hypertrophy. Resultant moderate bilateral subarticular stenosis. Mild left L4 foraminal narrowing. Right neural foramen remains grossly patent. L5-S1: Mild disc bulge. Moderate left greater than right facet hypertrophy. Resultant mild bilateral subarticular stenosis. Foramina remain grossly patent. IMPRESSION: 1. No acute osseous abnormality within the lumbar spine. 2. Multilevel degenerative spondylosis and facet arthrosis with resultant mild to moderate bilateral subarticular stenosis at L3-4 through L5-S1 as above. 3. Scattered retroperitoneal adenopathy, nonspecific, but could be reactive in nature or possibly due to lymphoproliferative disorder. 4. Osseous findings suggestive of possible spondyloarthropathy. 5. Aortic Atherosclerosis (ICD10-I70.0). Electronically Signed   By: Jeannine Boga M.D.   On: 03/06/2022 20:53   CT Renal Stone Study  Addendum Date: 03/06/2022   ADDENDUM REPORT: 03/06/2022 20:34 ADDENDUM: Mild upper abdominal and retroperitoneal adenopathy which could be secondary to systemic illness versus lymphoproliferative abnormality. Electronically Signed   By: Donavan Foil M.D.   On: 03/06/2022 20:34   Result Date: 03/06/2022 CLINICAL DATA:  Bilateral flank pain EXAM: CT ABDOMEN AND PELVIS WITHOUT CONTRAST TECHNIQUE: Multidetector CT imaging of the abdomen and pelvis was performed following the standard protocol without IV contrast. RADIATION DOSE REDUCTION: This exam was performed according to the departmental  dose-optimization program which includes automated exposure control, adjustment of the mA and/or kV according to patient size and/or use of iterative reconstruction technique. COMPARISON:  Chest CT 11/13/2020 FINDINGS: Lower chest: Lung bases demonstrate chronic small right pleural effusion and pleural thickening with adjacent scarring in the right lower lobe. Mild subpleural scarring at the right middle lobe. Cardiomegaly. Coronary vascular calcification Hepatobiliary: No focal liver abnormality is seen. No gallstones, gallbladder wall thickening, or biliary dilatation. Pancreas: Unremarkable. No pancreatic ductal dilatation or surrounding inflammatory changes. Spleen: Normal in size without focal abnormality. Adrenals/Urinary Tract: Adrenal glands are within normal limits. Kidneys show no hydronephrosis. Nonspecific perinephric fat stranding. No ureteral stone. The bladder is unremarkable Stomach/Bowel: Stomach nonenlarged. No dilated small bowel. No acute bowel wall thickening. Mild diverticular disease of the left colon. Vascular/Lymphatic: Moderate aortic atherosclerosis. No aneurysm. Mild gastrohepatic and porta hepatis adenopathy. Mild retroperitoneal adenopathy with nodes measuring up to 13 mm. Reproductive: Prostate is unremarkable. Other: Negative for pelvic effusion or free air. Small fat containing right inguinal hernia. Small umbilical hernia containing  fat and anterior wall of small bowel loops, but no obstructive features. Musculoskeletal: Bilateral SI joint sclerosis, left greater than right with mild ankylosis across the joint space. Sclerosis at the superior and inferior anterior corners of the vertebra. No acute osseous abnormality IMPRESSION: 1. Negative for hydronephrosis or ureteral stone. Nonspecific bilateral perinephric fat stranding, correlate for symptoms of upper urinary tract infection. 2. Cardiomegaly. Chronic appearing small right pleural effusion with scarring in the right middle lobe  and right base 3. Small umbilical hernia containing fat and anterior wall of bowel but no obstruction or incarceration. 4. 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 Electronically Signed: By: Donavan Foil M.D. On: 03/06/2022 20:22    Procedures Procedures    Medications Ordered in ED Medications  dexamethasone (DECADRON) injection 10 mg (10 mg Intramuscular Given 03/06/22 2222)    ED Course/ Medical Decision Making/ A&P                           Medical Decision Making Pt with an approximate 3 month hx of low back pain with radiation to his lower abd, worst rlq around site of previous hernia repair.  DDX including kidney/ureteral stone, low back mechanical pain, uti (denies urinary complaints), AAA, sbo, mesenteric ischemia, recurrent hernia, appy although chronicity makes this less likely.  Imaging and labs today most suggestive of a spondyloarthropathy of his spine.  Upper back and c spine not imaged, but given posture,  suspect also involving upper spine.  Intraabdominal lymphadenopathy favoring inflammatory process. Wbc count normal range.  He also has a recurrence of right inguinal hernia without incarceration, suspect RLQ pain is unrelated to the lumbar findings.  He was placed on a prednisone taper and prescribed hydrocodone, caution advised with sedation.  He was advised to f/u with his primary provider for further management of this new finding.  He may benefit from ortho and/or rheumatology evaluation, will defer to pcp for further management.  He was also referred back to Dr. Arnoldo Morale regarding the new hernia findings.  Discussed ways to alleviate this pain at home, return precautions also discussed.   Amount and/or Complexity of Data Reviewed Labs: ordered.    Details: labs unremarkable. Radiology: ordered and independent interpretation performed.    Details: per above  Risk Prescription drug  management.           Final Clinical Impression(s) / ED Diagnoses Final diagnoses:  Ankylosing spondylitis lumbar region [facility])  Unilateral recurrent inguinal hernia without obstruction or gangrene    Rx / DC Orders ED Discharge Orders          Ordered    HYDROcodone-acetaminophen (NORCO/VICODIN) 5-325 MG tablet  Every 4 hours PRN        03/06/22 2150    predniSONE (DELTASONE) 10 MG tablet        03/06/22 2150    HYDROcodone-acetaminophen (NORCO/VICODIN) 5-325 MG tablet  Every 4 hours PRN        03/06/22 2151              Evalee Jefferson, PA-C 03/07/22 1057    Fredia Sorrow, MD 03/12/22 [phone]

## 2022-03-06 NOTE — ED Triage Notes (Signed)
Pt to ED c/o lower back pain and lower abdominal pain x 3 months, progressively getting worse. Denies n/v/d. Denies urinary symptoms

## 2022-03-08 NOTE — Telephone Encounter (Signed)
Ins denied pt went to hospital

## 2022-03-11 MED FILL — Hydrocodone-Acetaminophen Tab 5-325 MG: ORAL | Qty: 6 | Status: AC

## 2022-03-13 ENCOUNTER — Ambulatory Visit: Payer: PRIVATE HEALTH INSURANCE | Admitting: Urology

## 2022-03-13 DIAGNOSIS — N401 Enlarged prostate with lower urinary tract symptoms: Secondary | ICD-10-CM

## 2022-03-21 NOTE — Progress Notes (Unsigned)
Cardiology Office Note    Date:  03/23/2022   ID:  Jordan Kelly, DOB [birth year], MRN [id number]  PCP:  Renee Rival, FNP  Cardiologist: Evaluated by Dr. Gasper Sells and Dr. Audie Box during a prior admission but requests to follow-up in [facility] Complaint  Patient presents with   Follow-up    6 month visit    History of Present Illness:    Jordan Kelly is a 61 y.o. male with past medical history of persistent atrial flutter (s/p TEE/DCCV in 10/2020), HFmrEF (EF 45-50% in 10/2020, cath in 01/2021 showing only minor CAD with no significant stenosis), PFO, COPD, tobacco use and alcohol use who presents to the office today for overdue 71-monthfollow-up.  He was last examined by myself in 02/2021 following his recent cardiac catheterization which showed only minor nonobstructive disease and normal right heart pressures. He reported overall doing well at the time of his visit and had reduced his tobacco use to less than 5 cigarettes daily and his alcohol use to less than 3-4 beers per week. He was continued on Bisoprolol and Eliquis.   In talking with the patient today, he reports his main issue over the past few months has been progressive back pain and he was recently diagnosed with possible ankylosing spondylitis while in the Emergency Department. They informed him to follow-up with Rheumatology but he has not heard from anyone regarding this. From a cardiac perspective, he has been doing well and denies any recent chest pain or palpitations. No progressive dyspnea on exertion, orthopnea, PND or pitting edema. He has not utilized Torsemide since his last visit. Remains on Eliquis for anticoagulation with no recent melena, hematochezia or hematuria.    Past Medical History:  Diagnosis Date   Atrial flutter ([facility]    a. s/p TEE/DCCV in 10/2020   CHF (congestive heart failure) ([facility]    a. EF previously 45-50% in 10/2020 with cath in 01/2021 showing only minor CAD and  normal right heart pressures   COPD (chronic obstructive pulmonary disease) (HCC)    Depression    Dysrhythmia    PFO (patent foramen ovale)    Psoriasis     Past Surgical History:  Procedure Laterality Date   BUBBLE STUDY  11/08/2020   Procedure: BUBBLE STUDY;  Surgeon: SJerline Pain MD;  [place] [facility]  Service: Cardiovascular;;   CARDIOVERSION N/A 11/08/2020   Procedure: CARDIOVERSION;  Surgeon: SJerline Pain MD;  [place] [facility]  Service: Cardiovascular;  Laterality: N/A;   COLONOSCOPY WITH PROPOFOL N/A 07/16/2021   Procedure: COLONOSCOPY WITH PROPOFOL;  Surgeon: CEloise Harman DO;  [facility];  Service: Endoscopy;  Laterality: N/A;  10:30am   HERNIA REPAIR     right inguinal hernia   POLYPECTOMY  07/16/2021   Procedure: POLYPECTOMY;  Surgeon: CEloise Harman DO;  [facility];  Service: Endoscopy;;   RIGHT/LEFT HEART CATH AND CORONARY ANGIOGRAPHY N/A 01/29/2021   Procedure: RIGHT/LEFT HEART CATH AND CORONARY ANGIOGRAPHY;  Surgeon: CSherren Mocha MD;  [facility];  Service: Cardiovascular;  Laterality: N/A;   TEE WITHOUT CARDIOVERSION N/A 11/08/2020   Procedure: TRANSESOPHAGEAL ECHOCARDIOGRAM (TEE);  Surgeon: SJerline Pain MD;  [facility];  Service: Cardiovascular;  Laterality: N/A;    Current Medications: Outpatient Medications Prior to Visit  Medication Sig Dispense Refill   albuterol (VENTOLIN HFA) 108 (90 Base) MCG/ACT inhaler Inhale 2 puffs into the lungs every 6 (six)  hours as needed for wheezing or shortness of breath. 8 g 5   diazePAM (VALIUM PO) Take 2.5 mg by mouth daily.     fluticasone-salmeterol (ADVAIR DISKUS) 250-50 MCG/ACT AEPB Inhale 1 puff into the lungs in the morning and at bedtime. 1 each 0   Multiple Vitamin (MULTIVITAMIN WITH MINERALS) TABS tablet Take 1 tablet by mouth daily. 30 tablet 3   tamsulosin (FLOMAX) 0.4 MG CAPS capsule Take 1 capsule (0.4 mg total) by mouth daily. 30  capsule 3   tetrahydrozoline-zinc (VISINE-AC) 0.05-0.25 % ophthalmic solution Place 1-2 drops into both eyes 3 (three) times daily as needed (dry/irritated eyes.).     apixaban (ELIQUIS) 5 MG TABS tablet TAKE (1) TABLET BY MOUTH TWICE DAILY. 60 tablet 5   bisoprolol (ZEBETA) 5 MG tablet Take 1 tablet (5 mg total) by mouth daily. 90 tablet 3   diclofenac Sodium (VOLTAREN) 1 % GEL Apply 4 g topically 4 (four) times daily. (Patient not taking: Reported on 03/22/2022) 50 g 0   HYDROcodone-acetaminophen (NORCO/VICODIN) 5-325 MG tablet Take 1 tablet by mouth every 4 (four) hours as needed. (Patient not taking: Reported on 03/22/2022) 20 tablet 0   HYDROcodone-acetaminophen (NORCO/VICODIN) 5-325 MG tablet Take 1 tablet by mouth every 4 (four) hours as needed. (Patient not taking: Reported on 03/22/2022) 6 tablet 0   predniSONE (DELTASONE) 10 MG tablet 6, 5, 4, 3, 2 then 1 tablet by mouth daily for 6 days total. (Patient not taking: Reported on 03/22/2022) 21 tablet 0   sodium chloride (OCEAN) 0.65 % SOLN nasal spray Place 1 spray into both nostrils as needed for congestion. (Patient not taking: Reported on 03/22/2022) 15 mL 0   torsemide (DEMADEX) 20 MG tablet Take 1 tablet (20 mg total) by mouth as needed. (Patient not taking: Reported on 03/22/2022) 90 tablet 3   No facility-administered medications prior to visit.     Allergies:   Patient has no known allergies.   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  Tobacco Use   Smoking status: Every Day    Packs/day: 0.25    Types: Cigarettes   Smokeless tobacco: Never   Tobacco comments:    Smokes 5 cigarettes per day 04/02/21  Vaping Use   Vaping Use: Never used  Substance and Sexual Activity   Alcohol use: Yes     Comment: 5-6 per week   Drug use: Yes    Types: Marijuana    Comment: daily   Sexual activity: Not Currently  Other Topics Concern   Not on file  Social History Narrative   Not on file   Social Determinants of Health   Financial Resource Strain: Unknown (01/09/2019)   Overall Financial Resource Strain (CARDIA)    Difficulty of Paying Living Expenses: Patient refused  Food Insecurity: No Food Insecurity (11/30/2020)   Hunger Vital Sign    Worried About Running Out of Food in the Last Year: Never true    Ran Out of Food in the Last Year: Never true  Transportation Needs: No Transportation Needs (11/30/2020)   PRAPARE - Hydrologist (Medical): No    Lack of Transportation (Non-Medical): No  Physical Activity: Unknown (01/09/2019)  Exercise Vital Sign    Days of Exercise per Week: Patient refused    Minutes of Exercise per Session: Patient refused  Stress: Unknown (01/09/2019)   [place]    Feeling of Stress : Patient refused  Social Connections: Unknown (01/09/2019)   Social Connection and Isolation Panel [NHANES]    Frequency of Communication with Friends and Family: Patient refused    Frequency of Social Gatherings with Friends and Family: Patient refused    Attends Religious Services: Patient refused    Marine scientist or Organizations: Patient refused    Attends Archivist Meetings: Patient refused    Marital Status: Patient refused     Family History:  The patient's family history includes COPD in his father.   Review of Systems:    Please see the history of present illness.     All other systems reviewed and are otherwise negative except as noted above.   Physical Exam:    VS:  BP 122/70   Pulse 68   Ht '5\' 10"'$  (1.778 m)   Wt 160 lb 12.8 oz (72.9 kg)   SpO2 94%   BMI 23.07 kg/m    General: Well developed, well nourished,male appearing in no acute  distress. Head: Normocephalic, atraumatic. Neck: No carotid bruits. JVD not elevated.  Lungs: Respirations regular and unlabored, without wheezes or rales.  Heart: Regular rate and rhythm. No S3 or S4.  No murmur, no rubs, or gallops appreciated. Abdomen: Appears non-distended. No obvious abdominal masses. Msk:  Strength and tone appear normal for age. No obvious joint deformities or effusions. Extremities: No clubbing or cyanosis. No pitting edema.  Distal pedal pulses are 2+ bilaterally. Neuro: Alert and oriented X 3. Moves all extremities spontaneously. No focal deficits noted. Psych:  Responds to questions appropriately with a normal affect. Skin: No rashes or lesions noted  Wt Readings from Last 3 Encounters:  03/22/22 160 lb 12.8 oz (72.9 kg)  03/06/22 158 lb (71.7 kg)  02/27/22 157 lb 1.3 oz (71.3 kg)     Studies/Labs Reviewed:   EKG:  EKG is ordered today. The ekg ordered today demonstrates NSR, HR 66 with no acute ST changes.   Recent Labs: 03/06/2022: ALT 16; BUN 10; Creatinine, Ser 0.59; Hemoglobin 14.6; Platelets 330; Potassium 4.6; Sodium 133   Lipid Panel    Component Value Date/Time   CHOL 133 01/30/2022 0819   TRIG 35 01/30/2022 0819   HDL 83 01/30/2022 0819   CHOLHDL 1.6 01/30/2022 0819   LDLCALC 41 01/30/2022 0819    Additional studies/ records that were reviewed today include:   Echocardiogram: 10/2020 IMPRESSIONS     1. Left ventricular ejection fraction, by estimation, is 45 to 50%. The  left ventricle has mildly decreased function. The left ventricle  demonstrates global hypokinesis. Left ventricular diastolic function could  not be evaluated. There is the  interventricular septum is flattened in diastole ('D' shaped left  ventricle), consistent with right ventricular volume overload. The average  left ventricular global longitudinal strain is -12.0 %. The global  longitudinal strain is abnormal.   2. Right ventricular systolic function is  moderately reduced. The right  ventricular size is moderately enlarged. There is mildly elevated  pulmonary artery systolic pressure.   3. Right atrial size was severely dilated.   4. The mitral valve is normal in structure. No evidence of mitral valve  regurgitation.   5. The aortic valve is tricuspid. Aortic  valve regurgitation is not  visualized. No aortic stenosis is present.   6. The inferior vena cava is dilated in size with <50% respiratory  variability, suggesting right atrial pressure of 15 mmHg.   7. Evidence of atrial level shunting detected by color flow Doppler.  There is a small secundum atrial septal defect with bidirectional shunting  across the atrial septum.   Comparison(s): No prior Echocardiogram.   R/LHC: 01/2021 1.  Minor nonobstructive coronary artery disease with patent coronary arteries and no high-grade stenoses 2.  Essentially normal right heart pressures with a pulmonary artery pressure of 43/15, mean 25, normal right atrial pressure of 5 mmHg, and normal cardiac output of 5.35 with an index of 2.81   Recommend: Ongoing efforts at medical therapy.  The patient appears to be very well compensated at present.  It is possible that he was decompensated with atrial flutter as the precipitant back a few months ago.  He seems to be clinically much better and his right heart pressures are essentially normal with the exception of a borderline pulmonary pressure.  Cardiac output is preserved and he seems to be doing quite well.  Assessment:    1. History of atrial flutter   2. History of cardiomyopathy   3. Other emphysema ([place])   4. 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 Persistent Atrial Flutter - He underwent successful TEE/DCCV in 10/2020. He denies any recent palpitations and is in NSR today. We discussed stopping anticoagulation given his CHA2DS2-VASc Score of 1 but he prefers to remain on  this for now which is reasonable given no recent bleeding issues. Recent labs in 02/2022 showed his Hgb was stable at 14.6 with platelets at 330. Continue current regimen for now with Eliquis '5mg'$  BID and Bisoprolol '5mg'$  daily.   2. HFmrEF  - His EF was at 45-50% in 10/2020 with cath in 01/2021 showing only minor CAD with no significant stenosis. His decompensation in 10/2020 was felt to be secondary to atrial flutter at that time. - No recent orthopnea, PND or pitting edema. No longer taking Torsemide and his weight has been stable. Continue with Bisoprolol '5mg'$  daily. Can consider a repeat echo in the future for reassessment of his EF but suspect this has normalized as felt to be tachycardia-mediated at that time. Did not order today as he has significant pain with sitting or lying down.   3. COPD/Tobacco Use - Reports his respiratory status has been stable. He has reduced his tobacco use to less than 5 cigarettes per day.   4. Ankylosing Spondylitis - Diagnosed during his recent Emergency Department visit. Will refer to Rheumatology for additional evaluation since a referral has not yet been entered.    Medication Adjustments/Labs and Tests Ordered: Current medicines are reviewed at length with the patient today.  Concerns regarding medicines are outlined above.  Medication changes, Labs and Tests ordered today are listed in the Patient Instructions below. Patient Instructions  Medication Instructions:  Your physician recommends that you continue on your current medications as directed. Please refer to the Current Medication list given to you today.  *If you need a refill on your cardiac medications before your next appointment, please call your pharmacy*   Lab Work: NONE   If you have labs (blood work) drawn today and your tests are completely normal, you will receive your results only by: [place] (if you have  MyChart) OR A paper copy in the mail If you have any lab test that is  abnormal or we need to change your treatment, we will call you to review the results.   Testing/Procedures: NONE    Follow-Up: At [facility], you and your health needs are our priority.  As part of our continuing mission to provide you with exceptional heart care, we have created designated Provider Care Teams.  These Care Teams include your primary Cardiologist (physician) and Advanced Practice Providers (APPs -  Physician Assistants and Nurse Practitioners) who all work together to provide you with the care you need, when you need it.  We recommend signing up for the patient portal called "MyChart".  Sign up information is provided on this After Visit Summary.  MyChart is used to connect with patients for Virtual Visits (Telemedicine).  Patients are able to view lab/test results, encounter notes, upcoming appointments, etc.  Non-urgent messages can be sent to your provider as well.   To learn more about what you can do with MyChart, go to [email or web address].    Your next appointment:   1 year(s)  The format for your next appointment:   In Person  Provider:   Bernerd Pho, PA-C    Other Instructions Thank you for choosing [facility]!    Important Information About Sugar         Signed, Erma Heritage, PA-C  03/23/2022 8:48 AM    [place] [address] [place], [place] [address] Phone: [phone] Fax: [phone]

## 2022-03-22 ENCOUNTER — Ambulatory Visit (INDEPENDENT_AMBULATORY_CARE_PROVIDER_SITE_OTHER): Payer: PRIVATE HEALTH INSURANCE | Admitting: Student

## 2022-03-22 ENCOUNTER — Encounter: Payer: Self-pay | Admitting: Student

## 2022-03-22 VITALS — BP 122/70 | HR 68 | Ht 70.0 in | Wt 160.8 lb

## 2022-03-22 DIAGNOSIS — J438 Other emphysema: Secondary | ICD-10-CM | POA: Diagnosis not present

## 2022-03-22 DIAGNOSIS — M450 Ankylosing spondylitis of multiple sites in spine: Secondary | ICD-10-CM

## 2022-03-22 DIAGNOSIS — I4891 Unspecified atrial fibrillation: Secondary | ICD-10-CM

## 2022-03-22 DIAGNOSIS — Z8679 Personal history of other diseases of the circulatory system: Secondary | ICD-10-CM | POA: Diagnosis not present

## 2022-03-22 MED ORDER — APIXABAN 5 MG PO TABS: ORAL_TABLET | ORAL | 3 refills | Status: DC

## 2022-03-22 MED ORDER — BISOPROLOL FUMARATE 5 MG PO TABS
5.0000 mg | ORAL_TABLET | Freq: Every day | ORAL | 3 refills | Status: DC
Start: 2022-03-22 — End: 2024-03-24

## 2022-03-22 NOTE — Patient Instructions (Signed)
Medication Instructions:  Your physician recommends that you continue on your current medications as directed. Please refer to the Current Medication list given to you today.  *If you need a refill on your cardiac medications before your next appointment, please call your pharmacy*   Lab Work: NONE   If you have labs (blood work) drawn today and your tests are completely normal, you will receive your results only by: [place] (if you have MyChart) OR A paper copy in the mail If you have any lab test that is abnormal or we need to change your treatment, we will call you to review the results.   Testing/Procedures: NONE    Follow-Up: At [facility], you and your health needs are our priority.  As part of our continuing mission to provide you with exceptional heart care, we have created designated Provider Care Teams.  These Care Teams include your primary Cardiologist (physician) and Advanced Practice Providers (APPs -  Physician Assistants and Nurse Practitioners) who all work together to provide you with the care you need, when you need it.  We recommend signing up for the patient portal called "MyChart".  Sign up information is provided on this After Visit Summary.  MyChart is used to connect with patients for Virtual Visits (Telemedicine).  Patients are able to view lab/test results, encounter notes, upcoming appointments, etc.  Non-urgent messages can be sent to your provider as well.   To learn more about what you can do with MyChart, go to [email or web address].    Your next appointment:   1 year(s)  The format for your next appointment:   In Person  Provider:   Bernerd Pho, PA-C    Other Instructions Thank you for choosing [facility]!    Important Information About Sugar

## 2022-03-23 ENCOUNTER — Encounter: Payer: Self-pay | Admitting: Student

## 2022-03-27 ENCOUNTER — Encounter: Payer: Self-pay | Admitting: Nurse Practitioner

## 2022-03-27 ENCOUNTER — Ambulatory Visit (INDEPENDENT_AMBULATORY_CARE_PROVIDER_SITE_OTHER): Payer: PRIVATE HEALTH INSURANCE | Admitting: Nurse Practitioner

## 2022-03-27 VITALS — BP 128/74 | HR 86 | Ht 70.0 in | Wt 160.0 lb

## 2022-03-27 DIAGNOSIS — Z720 Tobacco use: Secondary | ICD-10-CM | POA: Diagnosis not present

## 2022-03-27 DIAGNOSIS — M456 Ankylosing spondylitis lumbar region: Secondary | ICD-10-CM

## 2022-03-27 DIAGNOSIS — K4090 Unilateral inguinal hernia, without obstruction or gangrene, not specified as recurrent: Secondary | ICD-10-CM | POA: Diagnosis not present

## 2022-03-27 MED ORDER — HYDROCODONE-ACETAMINOPHEN 5-325 MG PO TABS: 1.0000 | ORAL_TABLET | Freq: Four times a day (QID) | ORAL | 0 refills | Status: DC | PRN

## 2022-03-27 NOTE — Assessment & Plan Note (Addendum)
Norco 5/-'325mg'$  1 tablet every 6 hours as needed ordered Patient encouraged to follow-up with orthopedics Cannot take NSAIDs or Tylenol, on Eliquis, has history of liver cirrhosis Recently treated with steroids Use of heat and exercises  Encouraged Smoking cessation encouraged

## 2022-03-27 NOTE — Assessment & Plan Note (Signed)
Smokes about 5 cigarettes /day  Asked about quitting: confirms that he/she currently smokes cigarettes Advise to quit smoking: Educated about QUITTING to reduce the risk of cancer, cardio and cerebrovascular disease. Assess willingness: Unwilling to quit at this time, but is working on cutting back. Assist with counseling and pharmacotherapy: Counseled for 5 minutes and literature provided. Arrange for follow up: follow up in 3-6 months and continue to offer help.

## 2022-03-27 NOTE — Assessment & Plan Note (Signed)
follow  up with surgery

## 2022-03-27 NOTE — Patient Instructions (Addendum)
Please take norco , one tablet every 6 hours as needed for severe  pain, follow up with othopedics    It is important that you exercise regularly at least 30 minutes 5 times a week.  Think about what you will eat, plan ahead. Choose " clean, green, fresh or frozen" over canned, processed or packaged foods which are more sugary, salty and fatty. 70 to 75% of food eaten should be vegetables and fruit. Three meals at set times with snacks allowed between meals, but they must be fruit or vegetables. Aim to eat over a 12 hour period , example 7 am to 7 pm, and STOP after  your last meal of the day. Drink water,generally about 64 ounces per day, no other drink is as healthy. Fruit juice is best enjoyed in a healthy way, by EATING the fruit.  Thanks for choosing [facility], we consider it a privelige to serve you.

## 2022-03-27 NOTE — Progress Notes (Signed)
   Jordan Kelly     MRN: [id number]      DOB: 1961/01/19   HPI Jordan Kelly with past medical history of congestive heart failure, A-fib, CAD, cirrhosis, ankylosing spondylitis lumbar region, right inguinal hernia, BPH is here for follow up for ankylosing spondylitis lumbar region  ankylosing spondylitis lumbar region. Still having sharp aching pain currently 8/10, better when standing up, worse when sitting or sleeping on his side. Pain non radiating.  He has been wearing back brace for support.  He was referred to orthopedics but they have not seen him yet  Sees Dr Arnoldo Morale on 8/31 for his right inguinal hernia.  He denies any trouble with urination.     ROS Denies recent fever or chills. Denies sinus pressure, nasal congestion, ear pain or sore throat. Denies chest congestion, productive cough or wheezing. Denies chest pains, palpitations and leg swelling Denies  nausea, vomiting,diarrhea or constipation.   Denies dysuria, frequency, hesitancy or incontinence. Denies headaches, seizures, numbness, or tingling. Denies depression, anxiety or insomnia.   PE  BP 128/74 (BP Location: Right Arm, Cuff Size: Normal)   Pulse 86   Ht '5\' 10"'$  (1.778 m)   Wt 160 lb (72.6 kg)   SpO2 96%   BMI 22.96 kg/m   Patient alert and oriented and in no cardiopulmonary distress.   Chest: Clear to auscultation bilaterally.  CVS: S1, S2 no murmurs, no S3.Regular rate.  ABD: abdominal tenderness right lower quadrant, inguinal hernia noted on the right  Ext: No edema  MS: decreased ROM spine has back brace in place, adequate shoulders, hips and knees.  Psych: Good eye contact, normal affect. Memory intact not anxious or depressed appearing.  CNS: CN 2-12 intact, power,  normal throughout.no focal deficits noted.   Assessment & Plan  Ankylosing spondylitis lumbar region ([place]) Norco 5/-'325mg'$  1 tablet every 6 hours as needed ordered Patient encouraged to follow-up with orthopedics Cannot take  NSAIDs or Tylenol, on Eliquis, has history of liver cirrhosis Recently treated with steroids Use of heat and exercises  Encouraged Smoking cessation encouraged   Right inguinal hernia follow  up with surgery   Tobacco abuse Smokes about 5 cigarettes /day  Asked about quitting: confirms that he/she currently smokes cigarettes Advise to quit smoking: Educated about QUITTING to reduce the risk of cancer, cardio and cerebrovascular disease. Assess willingness: Unwilling to quit at this time, but is working on cutting back. Assist with counseling and pharmacotherapy: Counseled for 5 minutes and literature provided. Arrange for follow up: follow up in 3-6 months and continue to offer help.

## 2022-04-11 ENCOUNTER — Ambulatory Visit (INDEPENDENT_AMBULATORY_CARE_PROVIDER_SITE_OTHER): Payer: PRIVATE HEALTH INSURANCE | Admitting: General Surgery

## 2022-04-11 ENCOUNTER — Telehealth: Payer: Self-pay | Admitting: Student

## 2022-04-11 ENCOUNTER — Telehealth: Payer: Self-pay

## 2022-04-11 ENCOUNTER — Encounter: Payer: Self-pay | Admitting: General Surgery

## 2022-04-11 VITALS — BP 124/77 | HR 70 | Temp 97.4°F | Resp 14 | Ht 70.0 in | Wt 161.0 lb

## 2022-04-11 DIAGNOSIS — M450 Ankylosing spondylitis of multiple sites in spine: Secondary | ICD-10-CM

## 2022-04-11 DIAGNOSIS — K4090 Unilateral inguinal hernia, without obstruction or gangrene, not specified as recurrent: Secondary | ICD-10-CM | POA: Diagnosis not present

## 2022-04-11 NOTE — Telephone Encounter (Signed)
Pt needs an appt, we can not send in pain medicine. Pt also needs to reach out to heart care in regards to referral, as they are the ones who placed the referral

## 2022-04-11 NOTE — Telephone Encounter (Signed)
Referral placed to [facility] rheumatology Pts mailbox full  [phone]

## 2022-04-11 NOTE — Telephone Encounter (Signed)
Tried to call patient voicemail is full

## 2022-04-11 NOTE — Telephone Encounter (Signed)
Patient called back said he will contact heart care and does not need no appt with our office for pain medicine.

## 2022-04-11 NOTE — Progress Notes (Signed)
Jordan Kelly; [id number]; February 27, 1961   HPI Patient is a 61 year old white male who was referred to my care by Marylouise Stacks, FNP for evaluation and treatment of a right inguinal hernia.  He states he noticed it recently when he went to the emergency room for lower back pain and was told that he had a fat-containing right inguinal hernia on CAT scan.  He is currently being worked up for his low back pain that may be due to ankylosing spondylitis.  He is not scheduled to see a rheumatologist until next year.  He states he does have some right testicular pain.  He denies any nausea or vomiting.  He states I fixed the right inguinal hernia in the remote past.  He does have a history of a fatty liver secondary to alcoholic cirrhosis.  He also has a history of atrial flutter which has resolved.  The right inguinal hernia is made worse with straining.  It does resolve when lying down.  His gait is significantly affected by his low back pain.  Patient is on Eliquis, though he recently saw cardiology who said he could stop it.  He wants to continue taking it because he does not want to have a stroke. Past Medical History:  Diagnosis Date   Atrial flutter ([place])    a. s/p TEE/DCCV in 10/2020   CHF (congestive heart failure) ([place])    a. EF previously 45-50% in 10/2020 with cath in 01/2021 showing only minor CAD and normal right heart pressures   COPD (chronic obstructive pulmonary disease) ([facility])    Depression    Dysrhythmia    PFO (patent foramen ovale)    Psoriasis     Past Surgical History:  Procedure Laterality Date   BUBBLE STUDY  11/08/2020   Procedure: BUBBLE STUDY;  Surgeon: Jerline Pain, MD;  Location: [facility];  Service: Cardiovascular;;   CARDIOVERSION N/A 11/08/2020   Procedure: CARDIOVERSION;  Surgeon: Jerline Pain, MD;  Location: [place];  Service: Cardiovascular;  Laterality: N/A;   COLONOSCOPY WITH PROPOFOL N/A 07/16/2021   Procedure: COLONOSCOPY WITH PROPOFOL;  Surgeon: Eloise Harman, DO;  [facility];  Service: Endoscopy;  Laterality: N/A;  10:30am   HERNIA REPAIR     right inguinal hernia   POLYPECTOMY  07/16/2021   Procedure: POLYPECTOMY;  Surgeon: Eloise Harman, DO;  [facility];  Service: Endoscopy;;   RIGHT/LEFT HEART CATH AND CORONARY ANGIOGRAPHY N/A 01/29/2021   Procedure: RIGHT/LEFT HEART CATH AND CORONARY ANGIOGRAPHY;  Surgeon: Sherren Mocha, MD;  Location: [place];  Service: Cardiovascular;  Laterality: N/A;   TEE WITHOUT CARDIOVERSION N/A 11/08/2020   Procedure: TRANSESOPHAGEAL ECHOCARDIOGRAM (TEE);  Surgeon: Jerline Pain, MD;  Location: [facility] ENDOS[facility];  Service: Cardiovascular;  Laterality: N/A;    Family History  Problem Relation Age of Onset   COPD Father     Current Outpatient Medications on File Prior to Visit  Medication Sig Dispense Refill   albuterol (VENTOLIN HFA) 108 (90 Base) MCG/ACT inhaler Inhale 2 puffs into the lungs every 6 (six) hours as needed for wheezing or shortness of breath. 8 g 5   apixaban (ELIQUIS) 5 MG TABS tablet TAKE (1) TABLET BY MOUTH TWICE DAILY. 180 tablet 3   bisoprolol (ZEBETA) 5 MG tablet Take 1 tablet (5 mg total) by mouth daily. 90 tablet 3   diclofenac Sodium (VOLTAREN) 1 % GEL Apply 4 g topically 4 (four) times daily. 50 g 0  fluticasone-salmeterol (ADVAIR DISKUS) 250-50 MCG/ACT AEPB Inhale 1 puff into the lungs in the morning and at bedtime. 1 each 0   HYDROcodone-acetaminophen (NORCO/VICODIN) 5-325 MG tablet Take 1 tablet by mouth every 6 (six) hours as needed. 10 tablet 0   Multiple Vitamin (MULTIVITAMIN WITH MINERALS) TABS tablet Take 1 tablet by mouth daily. 30 tablet 3   sodium chloride (OCEAN) 0.65 % SOLN nasal spray Place 1 spray into both nostrils as needed for congestion. 15 mL 0   tamsulosin (FLOMAX) 0.4 MG CAPS capsule Take 1 capsule (0.4 mg total) by mouth daily. 30 capsule 3   tetrahydrozoline-zinc (VISINE-AC) 0.05-0.25 % ophthalmic solution Place 1-2  drops into both eyes 3 (three) times daily as needed (dry/irritated eyes.).     No current facility-administered medications on file prior to visit.    No Known Allergies  Social History   Substance and Sexual Activity  Alcohol Use Yes   Comment: 5-6 per week    Social History   Tobacco Use  Smoking Status Every Day   Packs/day: 0.25   Types: Cigarettes  Smokeless Tobacco Never  Tobacco Comments   Smokes 5 cigarettes per day 04/02/21    Review of Systems  Constitutional:  Positive for malaise/fatigue.  HENT:  Positive for sinus pain.   Eyes:  Positive for blurred vision.  Respiratory:  Positive for wheezing.   Cardiovascular: Negative.   Gastrointestinal:  Positive for abdominal pain.  Genitourinary:  Positive for frequency.  Musculoskeletal:  Positive for back pain, joint pain and neck pain.  Skin: Negative.   Neurological: Negative.   Endo/Heme/Allergies:  Bruises/bleeds easily.  Psychiatric/Behavioral: Negative.      Objective   Vitals:   04/11/22 0955  BP: 124/77  Pulse: 70  Resp: 14  Temp: (!) 97.4 F (36.3 C)  SpO2: 94%    Physical Exam Vitals reviewed.  Constitutional:      Appearance: Normal appearance. He is normal weight. He is not ill-appearing.  HENT:     Head: Normocephalic and atraumatic.  Cardiovascular:     Rate and Rhythm: Normal rate and regular rhythm.     Heart sounds: Normal heart sounds. No murmur heard.    No friction rub. No gallop.  Pulmonary:     Effort: Pulmonary effort is normal. No respiratory distress.     Breath sounds: No stridor. No wheezing, rhonchi or rales.  Abdominal:     General: Bowel sounds are normal. There is no distension.     Palpations: Abdomen is soft. There is no mass.     Tenderness: There is no abdominal tenderness. There is no guarding or rebound.     Hernia: A hernia is present.     Comments:   Easily reducible umbilical hernia.  Easily reducible small right inguinal hernia.  No left inguinal  hernia appreciated.  Difficult to see a surgical scar in the right groin region.  Genitourinary:    Testes: Normal.  Skin:    General: Skin is warm and dry.  Neurological:     Mental Status: He is alert and oriented to person, place, and time.     Gait: Gait abnormal.   CT scan images personally reviewed Cardiology notes reviewed ER note reviewed  Assessment  Recurrent right inguinal hernia most likely caused by his abnormal gait from his ankylosing spondylitis.  He does have some intermittent testicular burning probably secondary to neuralgia. History of alcoholic cirrhosis History of atrial flutter, resolved Chronic anticoagulation on Eliquis Plan  I told  the patient that I do not recommend surgery at the present time given his multiple issues including his ankylosing spondylitis, chronic Eliquis use, and his alcoholic cirrhosis.  He is reducing his alcohol content.  I think the risk of incarceration is small.  I told him should he become more symptomatic, he could come back to my office and we could reconsider surgical intervention.  He understands and agrees.  Follow-up as needed.  Literature was given.

## 2022-04-11 NOTE — Telephone Encounter (Signed)
Patient came by the office needs referral to another provider than Bo Merino, MD could not get in until March 2024 needs an appointment sooner please send the referral elsewhere ASAP.  Please contact patient to let him know.  Patient in a lot of pain can the provider send in pain medicine to his pharmacy and call the patient to let him know what medicine if or not will be called into his pharmacy. [phone].  Pharmacy: [facility]

## 2022-04-11 NOTE — Telephone Encounter (Signed)
Patient called stating the back specialist B. Strader recommended is not available to see him until March 2024.  Patient would like a recommendation for another specialist that might see him sooner.

## 2022-04-11 NOTE — Telephone Encounter (Signed)
Pt was referred to rheumatology

## 2022-04-12 NOTE — Telephone Encounter (Signed)
No answer,voice mail full,mailed letter

## 2022-04-12 NOTE — Telephone Encounter (Signed)
Advised patient of message below about referral to [facility] rheumatology.

## 2022-04-17 ENCOUNTER — Telehealth: Payer: Self-pay | Admitting: Student

## 2022-04-17 NOTE — Telephone Encounter (Signed)
Pt calling stating that he spoke to [facility] and they did not receive a referral. [facility] and they provided fax number   Fax [phone]

## 2022-04-17 NOTE — Telephone Encounter (Signed)
Front desk sent to [employer] office

## 2022-05-22 ENCOUNTER — Ambulatory Visit (INDEPENDENT_AMBULATORY_CARE_PROVIDER_SITE_OTHER): Payer: PRIVATE HEALTH INSURANCE | Admitting: Internal Medicine

## 2022-05-22 ENCOUNTER — Encounter: Payer: Self-pay | Admitting: Internal Medicine

## 2022-05-22 VITALS — BP 136/78 | HR 97 | Ht 70.0 in | Wt 164.8 lb

## 2022-05-22 DIAGNOSIS — M545 Low back pain, unspecified: Secondary | ICD-10-CM | POA: Diagnosis not present

## 2022-05-22 DIAGNOSIS — Z23 Encounter for immunization: Secondary | ICD-10-CM | POA: Diagnosis not present

## 2022-05-22 DIAGNOSIS — M456 Ankylosing spondylitis lumbar region: Secondary | ICD-10-CM

## 2022-05-22 DIAGNOSIS — G8929 Other chronic pain: Secondary | ICD-10-CM

## 2022-05-22 MED ORDER — HYDROCODONE-ACETAMINOPHEN 5-325 MG PO TABS
1.0000 | ORAL_TABLET | Freq: Four times a day (QID) | ORAL | 0 refills | Status: DC | PRN
Start: 2022-05-22 — End: 2022-10-30

## 2022-05-22 NOTE — Patient Instructions (Signed)
It was a pleasure to see you today.  Thank you for giving [place] the opportunity to be involved in your care.  Below is a brief recap of your visit and next steps.  We will plan to see you again in February.  Summary I have refilled Norco today and referred you to pain management. Follow up in February

## 2022-05-22 NOTE — Progress Notes (Signed)
Acute Office Visit  Subjective:     Patient ID: Jordan Kelly, male    DOB: 1961-06-13, 61 y.o.   MRN: [id number]  Chief Complaint  Patient presents with   Back Pain    Since 11/20/2021 just seems to be getting worse.   [name] presents today for an acute visit for low back pain.  He has been experiencing back pain since April and the pain is getting worse.  He was last seen at [facility] by Vena Rua, NP in August and endorsed similar symptoms that time.  He has pain with forward flexion and extension.  Lateral movements are generally intact.  He does not have shooting pains into his legs.  He denies red flag symptoms of fever/chills, unintentional weight loss, saddle anesthesia, bowel/bladder incontinence, and lower extremity weakness.  [name] has previously taken Norco for pain relief.  He was diagnosed with ankylosing spondylitis based on imaging obtained in July.  He was referred to rheumatology and has an appointment scheduled for March.  He requests additional pain management assistance in the interim.  He would also like to receive his influenza vaccine today.  Review of Systems  Musculoskeletal:  Positive for back pain.  All other systems reviewed and are negative.       Objective:    BP 136/78   Pulse 97   Ht '5\' 10"'$  (1.778 m)   Wt 164 lb 12.8 oz (74.8 kg)   SpO2 93%   BMI 23.65 kg/m    Physical Exam Vitals reviewed.  Constitutional:      General: He is not in acute distress.    Appearance: Normal appearance. He is not ill-appearing.  HENT:     Head: Normocephalic and atraumatic.     Nose: Nose normal. No congestion or rhinorrhea.     Mouth/Throat:     Mouth: Mucous membranes are moist.     Pharynx: Oropharynx is clear.  Eyes:     Extraocular Movements: Extraocular movements intact.     Conjunctiva/sclera: Conjunctivae normal.     Pupils: Pupils are equal, round, and reactive to light.  Cardiovascular:     Rate and Rhythm: Normal rate and regular rhythm.      Pulses: Normal pulses.     Heart sounds: Normal heart sounds. No murmur heard. Pulmonary:     Effort: Pulmonary effort is normal.     Breath sounds: Normal breath sounds. No wheezing, rhonchi or rales.  Abdominal:     General: Abdomen is flat. Bowel sounds are normal. There is no distension.     Palpations: Abdomen is soft.     Tenderness: There is no abdominal tenderness.  Musculoskeletal:        General: Tenderness (Lumbar region, paraspinal muscles bilaterally) present. No swelling or deformity.     Cervical back: Normal range of motion.     Comments: Forward flexion and extension are limited at the waist.  Lateral movements are generally intact.  Skin:    General: Skin is warm and dry.     Capillary Refill: Capillary refill takes less than 2 seconds.  Neurological:     General: No focal deficit present.     Mental Status: He is alert and oriented to person, place, and time.     Motor: No weakness.  Psychiatric:        Mood and Affect: Mood normal.        Behavior: Behavior normal.        Thought Content: Thought  content normal.       Assessment & Plan:   Problem List Items Addressed This Visit       Chronic bilateral low back pain without sciatica    Presenting today for an acute visit to discuss back pain.  He states his pains been present since April.  He has previously been diagnosed with ankylosing spondylitis, based on imaging.  He has an appointment with rheumatology scheduled for March 2024.  In the interim he would like assistance with pain control as his pain is rather debilitating currently.  No red flag symptoms present on exam today. -I have refilled Norco today and have also placed a referral to pain management for future care. -He has previously scheduled follow-up with me arranged for February 2024 but will return to care before this date if needed      Relevant Medications   HYDROcodone-acetaminophen (NORCO/VICODIN) 5-325 MG tablet   Other Relevant  Orders   Ambulatory referral to Pain Clinic   Need for influenza vaccination    Influenza vaccine administered today      Meds ordered this encounter  Medications   HYDROcodone-acetaminophen (NORCO/VICODIN) 5-325 MG tablet    Sig: Take 1 tablet by mouth every 6 (six) hours as needed for up to 20 doses.    Dispense:  20 tablet    Refill:  0    Return if symptoms worsen or fail to improve.  Johnette Abraham, MD

## 2022-05-22 NOTE — Assessment & Plan Note (Addendum)
Presenting today for an acute visit to discuss back pain.  He states his pains been present since April.  He has previously been diagnosed with ankylosing spondylitis, based on imaging.  He has an appointment with rheumatology scheduled for March 2024.  In the interim he would like assistance with pain control as his pain is rather debilitating currently.  No red flag symptoms present on exam today. -I have refilled Norco today and have also placed a referral to pain management for future care. -He has previously scheduled follow-up with me arranged for February 2024 but will return to care before this date if needed

## 2022-05-22 NOTE — Assessment & Plan Note (Signed)
Influenza vaccine administered today.

## 2022-07-03 ENCOUNTER — Other Ambulatory Visit: Payer: Self-pay | Admitting: Pulmonary Disease

## 2022-07-11 ENCOUNTER — Other Ambulatory Visit: Payer: Self-pay | Admitting: Nurse Practitioner

## 2022-07-11 DIAGNOSIS — R109 Unspecified abdominal pain: Secondary | ICD-10-CM

## 2022-07-24 ENCOUNTER — Ambulatory Visit (INDEPENDENT_AMBULATORY_CARE_PROVIDER_SITE_OTHER): Payer: PRIVATE HEALTH INSURANCE | Admitting: Family Medicine

## 2022-07-24 ENCOUNTER — Encounter: Payer: Self-pay | Admitting: Family Medicine

## 2022-07-24 DIAGNOSIS — H5789 Other specified disorders of eye and adnexa: Secondary | ICD-10-CM | POA: Diagnosis not present

## 2022-07-24 MED ORDER — POLYMYXIN B-TRIMETHOPRIM 10000-0.1 UNIT/ML-% OP SOLN: OPHTHALMIC | 0 refills | Status: DC

## 2022-07-24 NOTE — Progress Notes (Signed)
Virtual Visit via Telephone Note   This visit type was conducted via telephone. This format is felt to be most appropriate for this patient at this time.  The patient did not have access to video technology/had technical difficulties with video requiring transitioning to audio format only (telephone).  All issues noted in this document were discussed and addressed.  No physical exam could be performed with this format.  Evaluation Performed:  Follow-up visit  Date:  07/24/2022   ID:  Jordan Kelly, DOB 24-Mar-1961, MRN [id number]  Patient Location: Home Provider Location: Clinic  Participants: Patient Location of Patient: Home Location of Provider: Clinic Consent was obtain for visit to be over via telehealth. I verified that I am speaking with the correct person using two identifiers.  PCP:  Johnette Abraham, MD   Chief Complaint: Right eye discharge  History of Present Illness:    Jordan Kelly is a 61 y.o. male with complaints of purulent discharge from the right eye.  He reports that a bug flew into his right eye while riding his scooter.  The initial symptoms were irritation and pruritus of the right eye.  The next day, his symptoms progressed to him having the purulent green discharge from the affected eye that swell up at night and early morning; however, the swelling subsided during the day.  He reports that his vision is intact; however, he reports blurry vision due to increased purulent discharge.  He denies pain in the affected eye, floaters, and diplopia.  The patient does not wear contact lenses or glasses.   The patient does not have symptoms concerning for COVID-19 infection (fever, chills, cough, or new shortness of breath).   Past Medical, Surgical, Social History, Allergies, and Medications have been Reviewed.  Past Medical History:  Diagnosis Date   Atrial flutter ([place])    a. s/p TEE/DCCV in 10/2020   CHF (congestive heart failure) ([place])    a. EF previously  45-50% in 10/2020 with cath in 01/2021 showing only minor CAD and normal right heart pressures   COPD (chronic obstructive pulmonary disease) ([facility])    Depression    Dysrhythmia    PFO (patent foramen ovale)    Psoriasis    Past Surgical History:  Procedure Laterality Date   BUBBLE STUDY  11/08/2020   Procedure: BUBBLE STUDY;  Surgeon: Jerline Pain, MD;  Location: [place];  Service: Cardiovascular;;   CARDIOVERSION N/A 11/08/2020   Procedure: CARDIOVERSION;  Surgeon: Jerline Pain, MD;  Location: [place];  Service: Cardiovascular;  Laterality: N/A;   COLONOSCOPY WITH PROPOFOL N/A 07/16/2021   Procedure: COLONOSCOPY WITH PROPOFOL;  Surgeon: Eloise Harman, DO;  [facility];  Service: Endoscopy;  Laterality: N/A;  10:30am   HERNIA REPAIR     right inguinal hernia   POLYPECTOMY  07/16/2021   Procedure: POLYPECTOMY;  Surgeon: Eloise Harman, DO;  [facility];  Service: Endoscopy;;   RIGHT/LEFT HEART CATH AND CORONARY ANGIOGRAPHY N/A 01/29/2021   Procedure: RIGHT/LEFT HEART CATH AND CORONARY ANGIOGRAPHY;  Surgeon: Sherren Mocha, MD;  Location: [place] [facility];  Service: Cardiovascular;  Laterality: N/A;   TEE WITHOUT CARDIOVERSION N/A 11/08/2020   Procedure: TRANSESOPHAGEAL ECHOCARDIOGRAM (TEE);  Surgeon: Jerline Pain, MD;  Location: [facility]OS[facility];  Service: Cardiovascular;  Laterality: N/A;     Current Meds  Medication Sig   albuterol (VENTOLIN HFA) 108 (90 Base) MCG/ACT inhaler INHALE 2 PUFFS INTO THE LUNGS EVERY 6  HOURS AS NEEDED FOR WHEEZING OR SHORTNESS OF BREATH.   apixaban (ELIQUIS) 5 MG TABS tablet TAKE (1) TABLET BY MOUTH TWICE DAILY.   bisoprolol (ZEBETA) 5 MG tablet Take 1 tablet (5 mg total) by mouth daily.   fluticasone-salmeterol (ADVAIR DISKUS) 250-50 MCG/ACT AEPB Inhale 1 puff into the lungs in the morning and at bedtime.   HYDROcodone-acetaminophen (NORCO/VICODIN) 5-325 MG tablet Take 1 tablet by mouth every 6 (six) hours as needed  for up to 20 doses.   Multiple Vitamin (MULTIVITAMIN WITH MINERALS) TABS tablet Take 1 tablet by mouth daily.   sodium chloride (OCEAN) 0.65 % SOLN nasal spray Place 1 spray into both nostrils as needed for congestion.   tamsulosin (FLOMAX) 0.4 MG CAPS capsule Take 1 capsule (0.4 mg total) by mouth daily.   tetrahydrozoline-zinc (VISINE-AC) 0.05-0.25 % ophthalmic solution Place 1-2 drops into both eyes 3 (three) times daily as needed (dry/irritated eyes.).   trimethoprim-polymyxin b (POLYTRIM) ophthalmic solution Apply 1 to 2 drops 4 times daily for 5 to 7 days     Allergies:   Patient has no known allergies.   ROS:   Please see the history of present illness.     All other systems reviewed and are negative.   Labs/Other Tests and Data Reviewed:    Recent Labs: 03/06/2022: ALT 16; BUN 10; Creatinine, Ser 0.59; Hemoglobin 14.6; Platelets 330; Potassium 4.6; Sodium 133   Recent Lipid Panel Lab Results  Component Value Date/Time   CHOL 133 01/30/2022 08:19 AM   TRIG 35 01/30/2022 08:19 AM   HDL 83 01/30/2022 08:19 AM   CHOLHDL 1.6 01/30/2022 08:19 AM   LDLCALC 41 01/30/2022 08:19 AM    Wt Readings from Last 3 Encounters:  05/22/22 164 lb 12.8 oz (74.8 kg)  04/11/22 161 lb (73 kg)  03/27/22 160 lb (72.6 kg)     Objective:    Vital Signs:  There were no vitals taken for this visit.     ASSESSMENT & PLAN:   Discharge of the right eye Will treat with polytrim ophthalmic solution Treatment is based on the patient's reported history, as I am unable to visualize the patient's eyes Encouraged to follow up with the ophthalmologist as soon as possible for reevaluation of the right eye Encourage patient to schedule an appointment with [facility] in [place] as soon as possible Encourage patient to follow up for worsening of symptoms of the right eye  Time:   Today, I have spent 12 minutes reviewing the chart, including problem list, medications, and with the patient with  telehealth technology discussing the above problems.   Medication Adjustments/Labs and Tests Ordered: Current medicines are reviewed at length with the patient today.  Concerns regarding medicines are outlined above.   Tests Ordered: No orders of the defined types were placed in this encounter.   Medication Changes: Meds ordered this encounter  Medications   trimethoprim-polymyxin b (POLYTRIM) ophthalmic solution    Sig: Apply 1 to 2 drops 4 times daily for 5 to 7 days    Dispense:  10 mL    Refill:  0     Note: This dictation was prepared with Dragon dictation along with smaller phrase technology. Similar sounding words can be transcribed inadequately or may not be corrected upon review. Any transcriptional errors that result from this process are unintentional.      Disposition:  Follow up  Signed, Alvira Monday, FNP  07/24/2022 2:58 PM     [facility]

## 2022-08-13 IMAGING — CT CT HEAD W/O CM
4 of 8 series · 14 of 47 positions shown, 16 images · non-contrast
Comparison: None.

CLINICAL DATA: Fall with head trauma.  On Eliquis.

EXAM:
CT HEAD WITHOUT CONTRAST
TECHNIQUE: Contiguous axial images were obtained from the base of the skull
through the vertex without intravenous contrast.

[Series 4: head without · axial · non-contrast · 0.43mm/px · z∈[-134,-84]mm · 2 of 32 slices shown]
[im 11/32  brain]
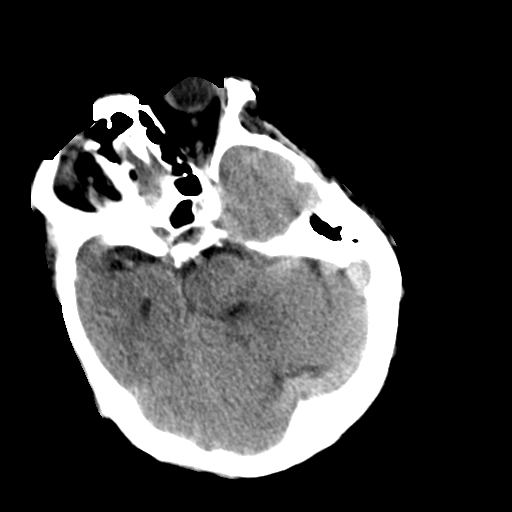
[im 21/32  brain]
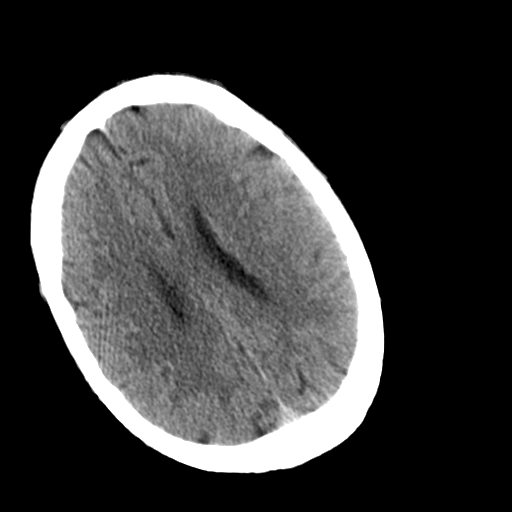

[Series 5: head without cor · coronal · non-contrast · 0.34mm/px · 3 of 68 slices shown]
[im 23/68  brain]
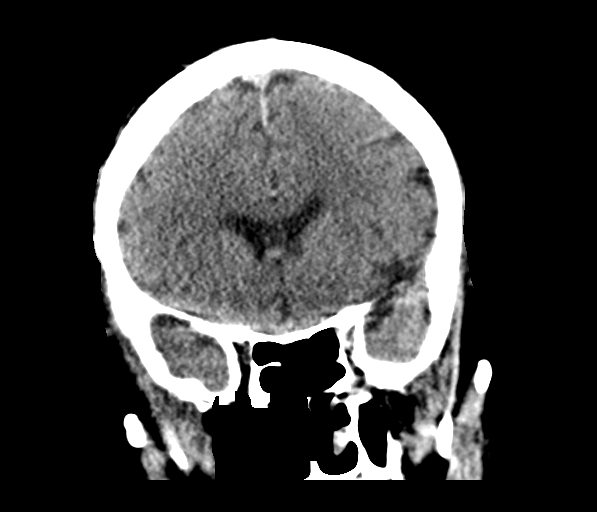
[im 30/68  brain]
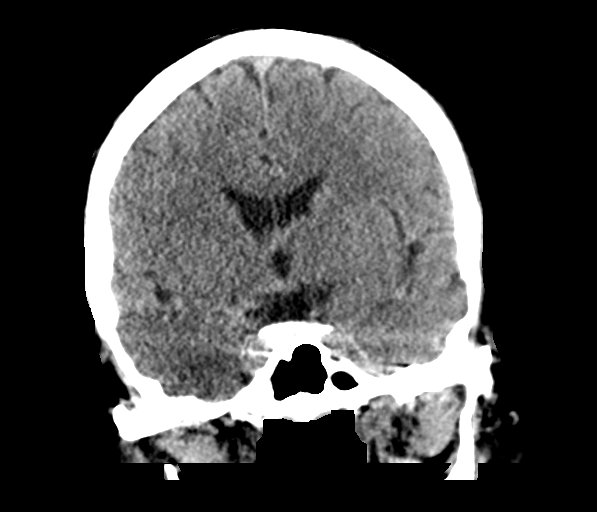
[im 38/68  brain]
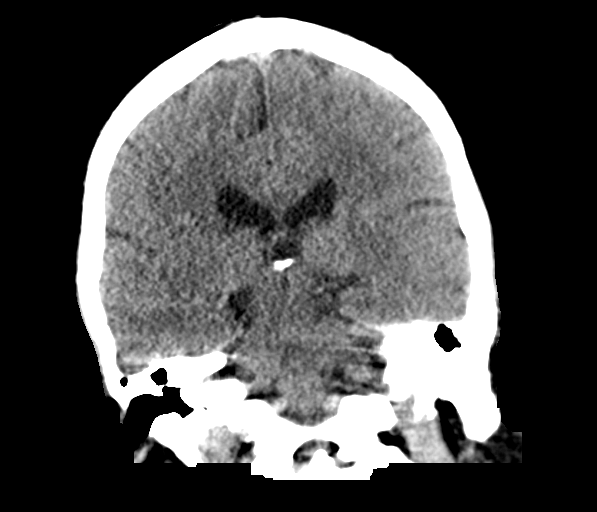

[Series 11: head without sag · sagittal · non-contrast · 0.34mm/px · 2 of 54 slices shown]
[im 18/54  brain]
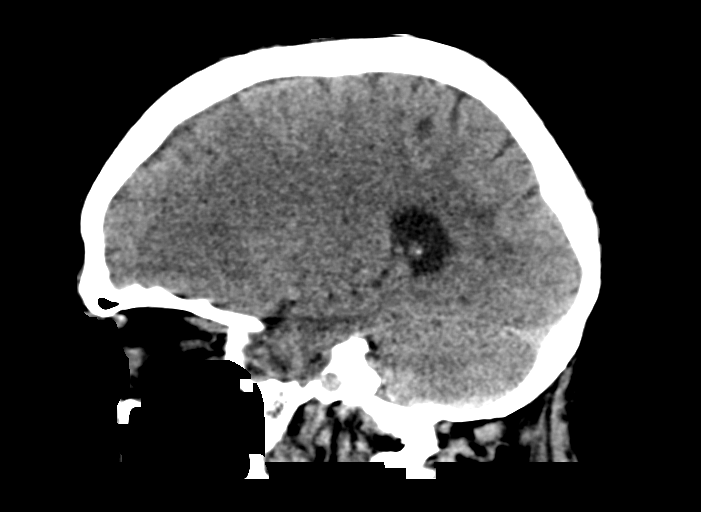
[im 36/54  brain]
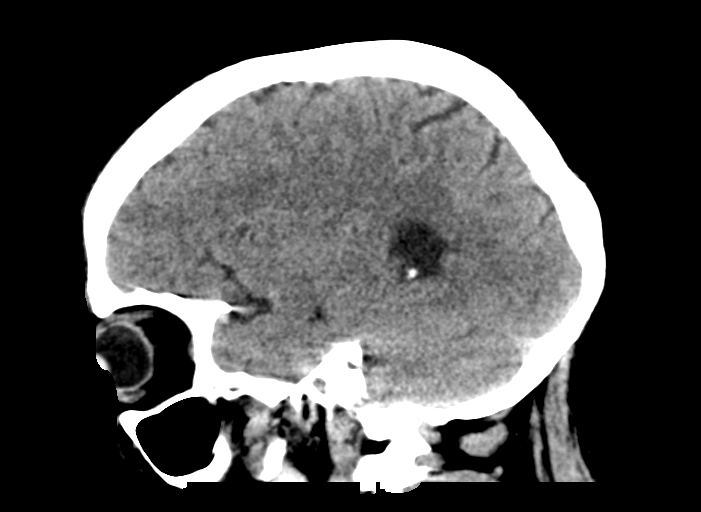

[Series 12: axial · axial · 0.37mm/px · z∈[-165,-53]mm · 7 of 77 slices shown, 9 images]
[im 10/77  brain]
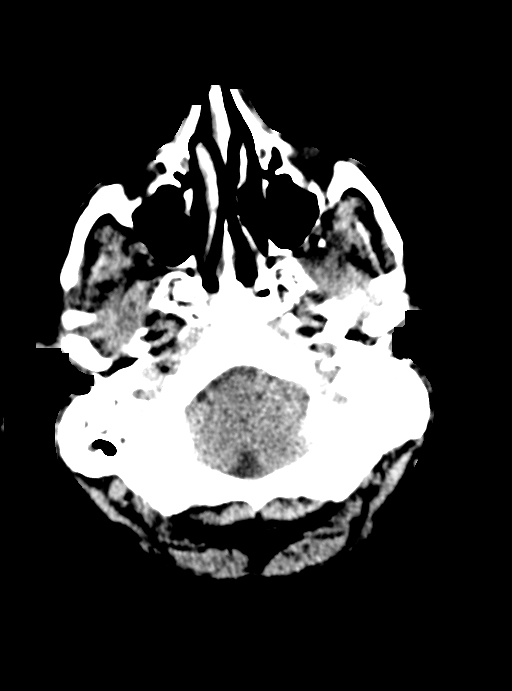
[im 10/77  bone]
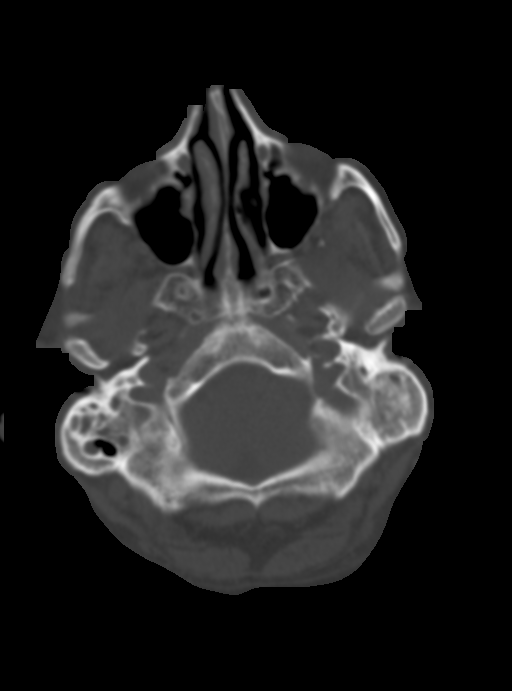
[im 20/77  brain]
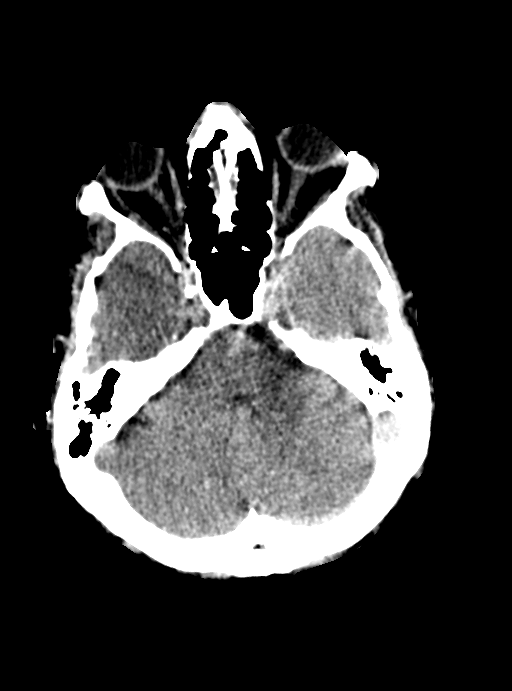
[im 29/77  brain]
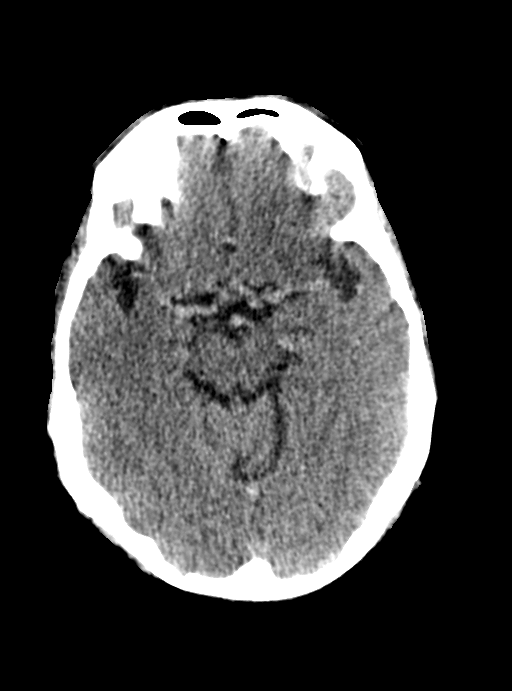
[im 39/77  brain]
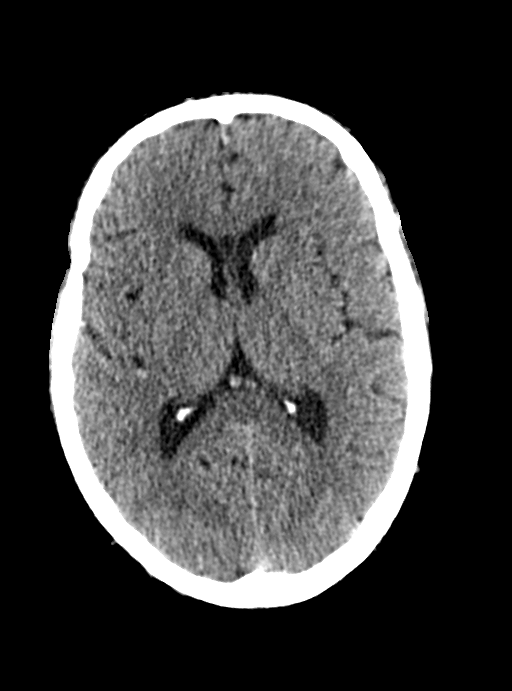
[im 48/77  brain]
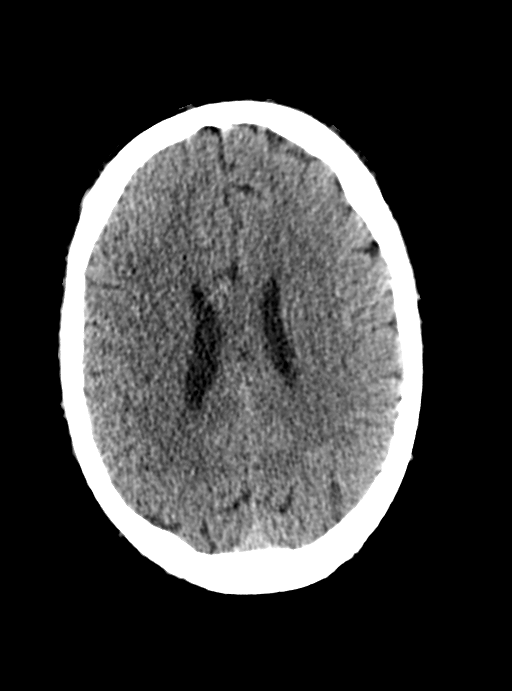
[im 48/77  bone]
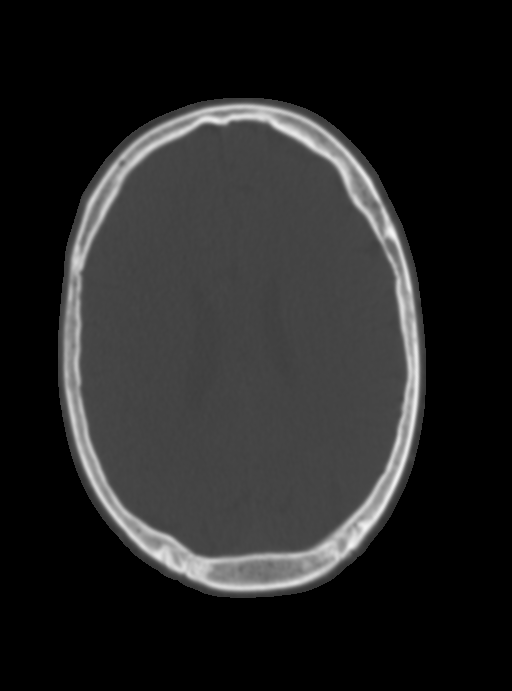
[im 58/77  brain]
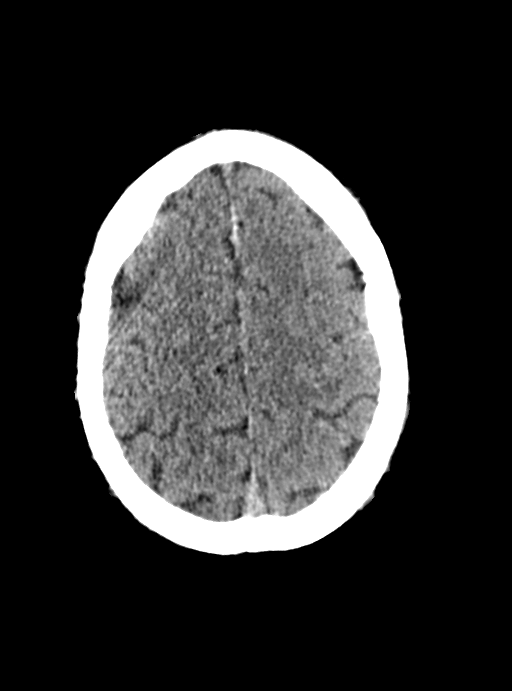
[im 67/77  brain]
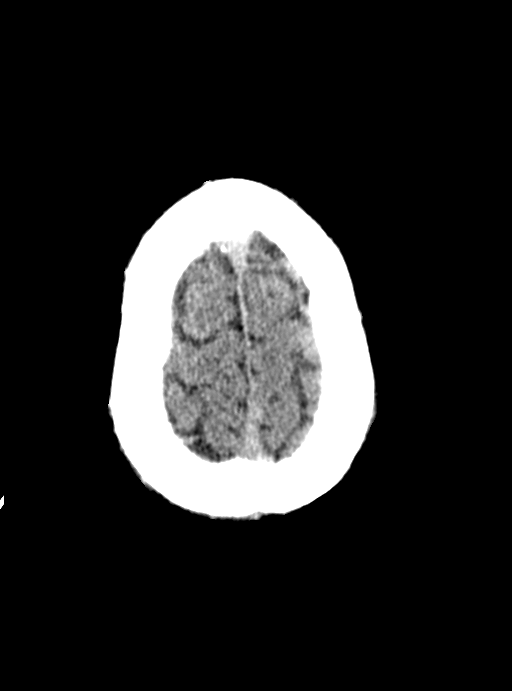

[14 of 47 positions shown; findings below may reference images not displayed]

FINDINGS: The study is mildly motion degraded despite repeat imaging.

Brain: There is no evidence of an acute infarct, intracranial
hemorrhage, mass, midline shift, or extra-axial fluid collection.
The ventricles and sulci are within normal limits for age.

Vascular: No hyperdense vessel.

Skull: No fracture or suspicious osseous lesion.

Sinuses/Orbits: Visualized paranasal sinuses are clear. Small
bilateral mastoid effusions. Unremarkable orbits.

Other: Possible mild left frontal scalp soft tissue swelling.
IMPRESSION: No evidence of acute intracranial abnormality.

## 2022-08-16 IMAGING — DX DG CHEST 1V PORT
1 series · 1 of 1 positions shown · non-contrast
Comparison: 01/04/2021

CLINICAL DATA: Shortness of breath

Respiratory failure with hypoxia
EXAM:
PORTABLE CHEST 1 VIEW

[chest]
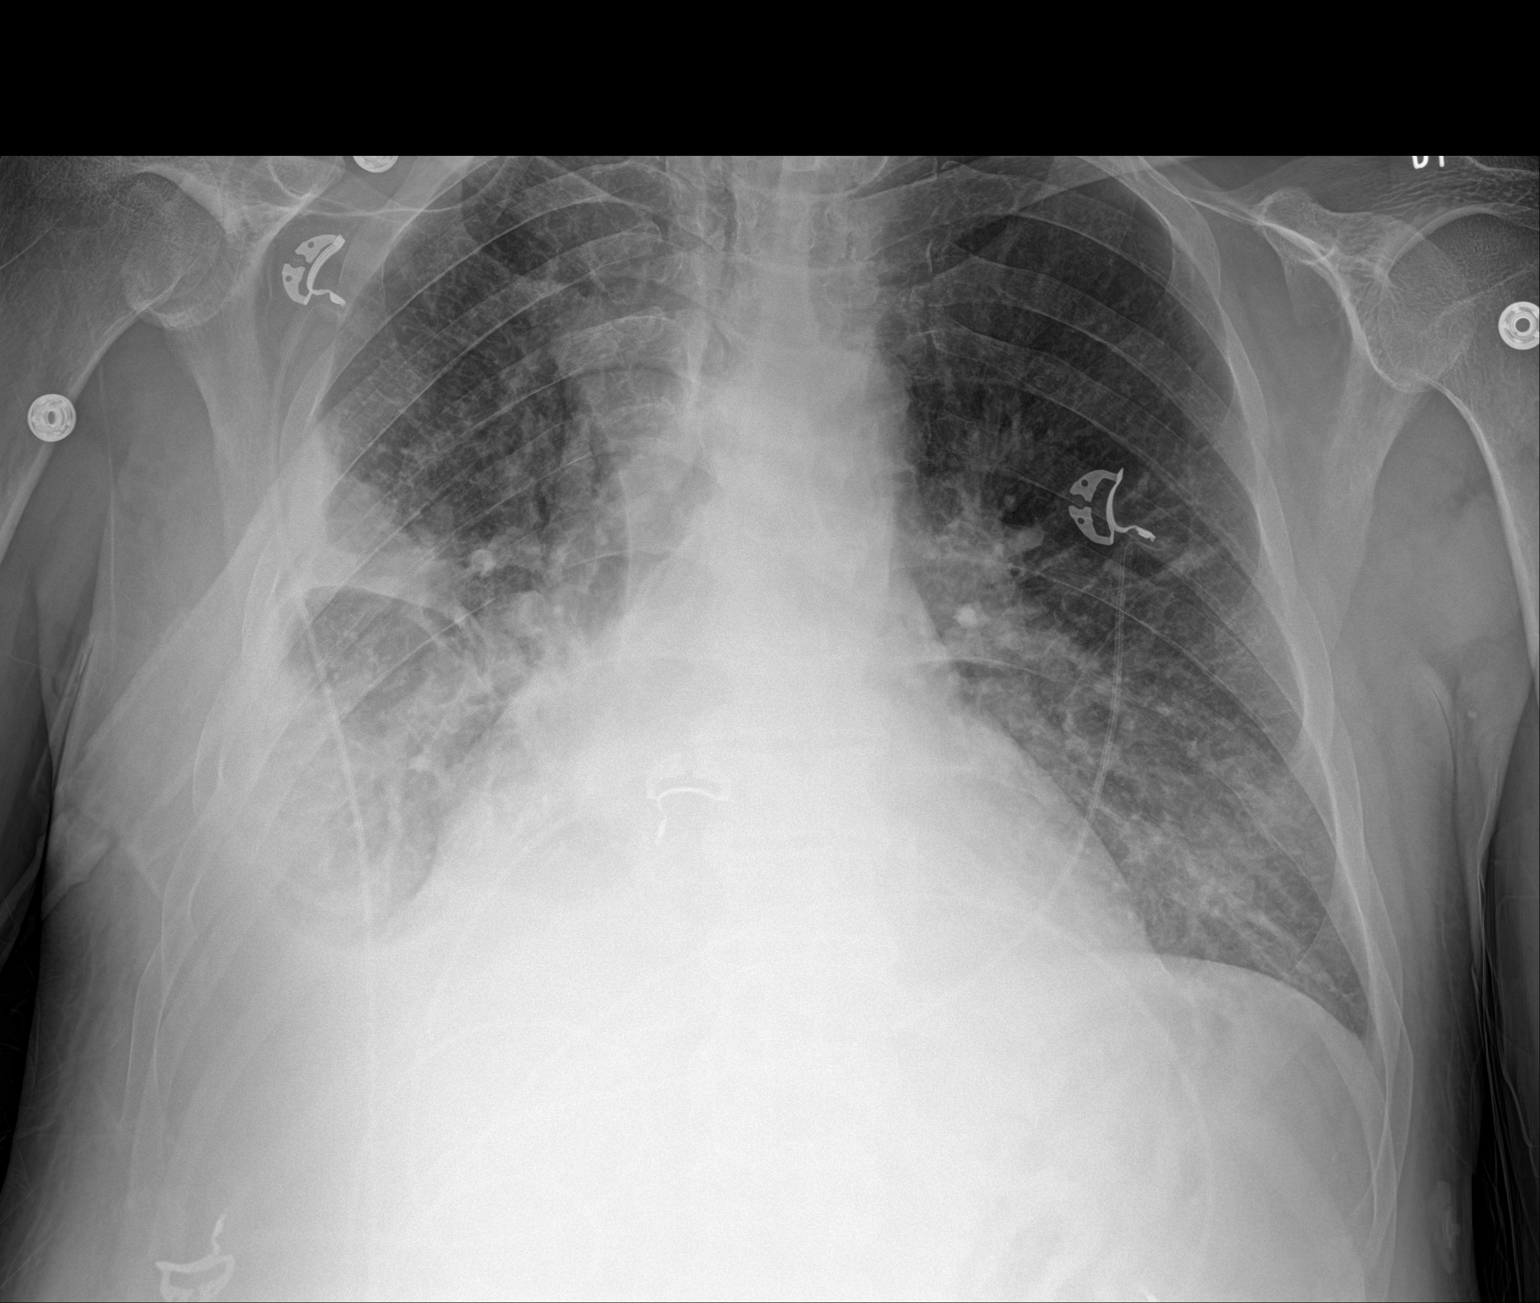

[1 of 1 positions shown; findings below may reference images not displayed]

FINDINGS: Mild cardiomegaly. Unchanged small right pleural effusion. Mild
pulmonary vascular congestion.

Focal airspace opacity in the right mid lung suspicious for
pneumonia.
IMPRESSION: Radiographic findings most consistent with CHF/fluid volume overload
with small right pleural effusion. There is more focal airspace
opacity at the right lung base which could represent superimposed
pneumonia.

## 2022-09-18 ENCOUNTER — Encounter: Payer: Self-pay | Admitting: Internal Medicine

## 2022-09-18 ENCOUNTER — Ambulatory Visit: Payer: PRIVATE HEALTH INSURANCE | Admitting: Internal Medicine

## 2022-09-18 NOTE — Progress Notes (Deleted)
   Established Patient Office Visit  Subjective   Patient ID: Jordan Kelly, male    DOB: [birth year]  Age: 62 y.o. MRN: [id number]  No chief complaint on file.   HPI  {History (Optional):23778}  ROS    Objective:     There were no vitals taken for this visit. {Vitals History (Optional):23777}  Physical Exam   No results found for any visits on 09/18/22.  {Labs (Optional):23779}  The 10-year ASCVD risk score (Arnett DK, et al., 2019) is: 6.7%    Assessment & Plan:   Problem List Items Addressed This Visit   None   No follow-ups on file.    Johnette Abraham, MD

## 2022-09-30 NOTE — Progress Notes (Deleted)
Office Visit Note  Patient: Jordan Kelly             Date of Birth: [birth year]           MRN: [id number]             PCP: Johnette Abraham, MD Referring: Erma Heritage, PA* Visit Date: 10/14/2022 Occupation: @GUAROCC$ @  Subjective:  No chief complaint on file.   History of Present Illness: Jordan Kelly is a 62 y.o. male ***   CT of lumbar spine 03/06/22: 1. No acute osseous abnormality within the lumbar spine. 2. Multilevel degenerative spondylosis and facet arthrosis with resultant mild to moderate bilateral subarticular stenosis at L3-4 through L5-S1 as above. 3. Scattered retroperitoneal adenopathy, nonspecific, but could be reactive in nature or possibly due to lymphoproliferative disorder. 4. Osseous findings suggestive of possible spondyloarthropathy. 5. Aortic Atherosclerosis (ICD10-I70.0).    Activities of Daily Living:  Patient reports morning stiffness for *** {minute/hour:19697}.   Patient {ACTIONS;DENIES/REPORTS:21021675::"Denies"} nocturnal pain.  Difficulty dressing/grooming: {ACTIONS;DENIES/REPORTS:21021675::"Denies"} Difficulty climbing stairs: {ACTIONS;DENIES/REPORTS:21021675::"Denies"} Difficulty getting out of chair: {ACTIONS;DENIES/REPORTS:21021675::"Denies"} Difficulty using hands for taps, buttons, cutlery, and/or writing: {ACTIONS;DENIES/REPORTS:21021675::"Denies"}  No Rheumatology ROS completed.   PMFS History:  Patient Active Problem List   Diagnosis Date Noted   Need for influenza vaccination 05/22/2022   Ankylosing spondylitis lumbar region ([place]) 03/27/2022   Right inguinal hernia 03/27/2022   Bilateral flank pain 02/27/2022   Prediabetes 01/25/2022   Chronic bilateral low back pain without sciatica 01/25/2022   BPH associated with nocturia 01/25/2022   Sore throat 09/03/2021   Acute recurrent sinusitis 09/03/2021   Depression, major, single episode, severe ([place]) 09/03/2021   Refused influenza vaccine 09/03/2021   CAD (coronary  artery disease) 06/11/2021   Left ear impacted cerumen 03/20/2021   Tinea cruris 03/13/2021   A-fib ([place]) 03/13/2021   Abnormal liver diagnostic imaging 12/12/2020   Pulmonary hypertension, primary ([place]) 11/28/2020   PFO (patent foramen ovale) 11/14/2020   Cirrhosis ([place]) 11/14/2020   Psoriasis 11/14/2020   AKI (acute kidney injury) ([place]) 11/14/2020   Congestive heart failure (CHF) ([place]) 11/04/2020   Encounter to establish care 10/30/2020   Emphysema/COPD ([place]) 10/30/2020   Constipation 10/30/2020   Nausea 10/30/2020   Tobacco abuse 01/09/2019   Alcohol abuse 01/09/2019    Past Medical History:  Diagnosis Date   Atrial flutter ([place])    a. s/p TEE/DCCV in 10/2020   CHF (congestive heart failure) ([place])    a. EF previously 45-50% in 10/2020 with cath in 01/2021 showing only minor CAD and normal right heart pressures   COPD (chronic obstructive pulmonary disease) ([facility])    Depression    Dysrhythmia    PFO (patent foramen ovale)    Psoriasis     Family History  Problem Relation Age of Onset   COPD Father    Past Surgical History:  Procedure Laterality Date   BUBBLE STUDY  11/08/2020   Procedure: BUBBLE STUDY;  Surgeon: Jerline Pain, MD;  Location: [place];  Service: Cardiovascular;;   CARDIOVERSION N/A 11/08/2020   Procedure: CARDIOVERSION;  Surgeon: Jerline Pain, MD;  Location: [place];  Service: Cardiovascular;  Laterality: N/A;   COLONOSCOPY WITH PROPOFOL N/A 07/16/2021   Procedure: COLONOSCOPY WITH PROPOFOL;  Surgeon: Eloise Harman, DO;  [facility];  Service: Endoscopy;  Laterality: N/A;  10:30am   HERNIA REPAIR     right inguinal hernia   POLYPECTOMY  07/16/2021   Procedure: POLYPECTOMY;  Surgeon: Hurshel Keys  K, DO;  [facility];  Service: Endoscopy;;   RIGHT/LEFT HEART CATH AND CORONARY ANGIOGRAPHY N/A 01/29/2021   Procedure: RIGHT/LEFT HEART CATH AND CORONARY ANGIOGRAPHY;  Surgeon: Sherren Mocha, MD;  Location: [place] [facility];  Service: Cardiovascular;  Laterality: N/A;   TEE WITHOUT CARDIOVERSION N/A 11/08/2020   Procedure: TRANSESOPHAGEAL ECHOCARDIOGRAM (TEE);  Surgeon: Jerline Pain, MD;  Location: [facility] ENDOSCOPY;  Service: Cardiovascular;  Laterality: N/A;   Social History   Social History Narrative   Not on file   Immunization History  Administered Date(s) Administered   Influenza,inj,Quad PF,6+ Mos 05/22/2022   Janssen (J&J) SARS-COV-2 Vaccination 10/11/2019, 01/19/2020   Tdap 02/27/2022   Zoster Recombinat (Shingrix) 02/27/2022     Objective: Vital Signs: There were no vitals taken for this visit.   Physical Exam   Musculoskeletal Exam: ***  CDAI Exam: CDAI Score: -- Patient Global: --; Provider Global: -- Swollen: --; Tender: -- Joint Exam 10/14/2022   No joint exam has been documented for this visit   There is currently no information documented on the homunculus. Go to the Rheumatology activity and complete the homunculus joint exam.  Investigation: No additional findings.  Imaging: No results found.  Recent Labs: Lab Results  Component Value Date   WBC 9.8 03/06/2022   HGB 14.6 03/06/2022   PLT 330 03/06/2022   NA 133 (L) 03/06/2022   K 4.6 03/06/2022   CL 100 03/06/2022   CO2 26 03/06/2022   GLUCOSE 86 03/06/2022   BUN 10 03/06/2022   CREATININE 0.59 (L) 03/06/2022   BILITOT 0.8 03/06/2022   ALKPHOS 110 03/06/2022   AST 17 03/06/2022   ALT 16 03/06/2022   PROT 7.7 03/06/2022   ALBUMIN 3.7 03/06/2022   CALCIUM 9.0 03/06/2022   GFRAA >60 01/09/2019    Speciality Comments: No specialty comments available.  Procedures:  No procedures performed Allergies: Patient has no known allergies.   Assessment / Plan:     Visit Diagnoses: Ankylosing spondylitis of multiple sites in spine [facility]) - 03/06/22:  Multilevel degenerative spondylosis and facet arthrosis withresultant mild to moderate bilateral subarticular stenosis at L3-4through L5-S1  Orders: No orders of the  defined types were placed in this encounter.  No orders of the defined types were placed in this encounter.   Face-to-face time spent with patient was *** minutes. Greater than 50% of time was spent in counseling and coordination of care.  Follow-Up Instructions: No follow-ups on file.   Ofilia Neas, PA-C  Note - This record has been created using Dragon software.  Chart creation errors have been sought, but may not always  have been located. Such creation errors do not reflect on  the standard of medical care.

## 2022-10-02 ENCOUNTER — Encounter: Payer: PRIVATE HEALTH INSURANCE | Admitting: Nurse Practitioner

## 2022-10-02 ENCOUNTER — Encounter: Payer: 59 | Admitting: Internal Medicine

## 2022-10-02 NOTE — Progress Notes (Deleted)
Complete physical exam  Patient: Jordan Kelly   DOB: 03-12-1961   62 y.o. Male  MRN: [id number]  Subjective:    No chief complaint on file.   Jordan Kelly is a 62 y.o. male who presents today for a complete physical exam. He reports consuming a {diet types:17450} diet. {types:19826} He generally feels {DESC; WELL/FAIRLY WELL/POORLY:18703}. He reports sleeping {DESC; WELL/FAIRLY WELL/POORLY:18703}. He {does/does not:200015} have additional problems to discuss today.    Most recent fall risk assessment:    07/24/2022    1:28 PM  [place] in the past year? 0  Number falls in past yr: 0  Injury with Fall? 0  Risk for fall due to : No Fall Risks  Follow up Falls evaluation completed     Most recent depression screenings:    07/24/2022    1:28 PM 05/22/2022    2:55 PM  PHQ 2/9 Scores  PHQ - 2 Score 0 1  PHQ- 9 Score 0     {VISON DENTAL STD PSA (Optional):27386}  {History (Optional):23778}  Patient Care Team: Johnette Abraham, MD as PCP - General (Internal Medicine) Eloise Harman, DO as Consulting Physician (Gastroenterology)   Outpatient Medications Prior to Visit  Medication Sig   albuterol (VENTOLIN HFA) 108 (90 Base) MCG/ACT inhaler INHALE 2 PUFFS INTO THE LUNGS EVERY 6 HOURS AS NEEDED FOR WHEEZING OR SHORTNESS OF BREATH.   apixaban (ELIQUIS) 5 MG TABS tablet TAKE (1) TABLET BY MOUTH TWICE DAILY.   bisoprolol (ZEBETA) 5 MG tablet Take 1 tablet (5 mg total) by mouth daily.   fluticasone-salmeterol (ADVAIR DISKUS) 250-50 MCG/ACT AEPB Inhale 1 puff into the lungs in the morning and at bedtime.   HYDROcodone-acetaminophen (NORCO/VICODIN) 5-325 MG tablet Take 1 tablet by mouth every 6 (six) hours as needed for up to 20 doses.   Multiple Vitamin (MULTIVITAMIN WITH MINERALS) TABS tablet Take 1 tablet by mouth daily.   sodium chloride (OCEAN) 0.65 % SOLN nasal spray Place 1 spray into both nostrils as needed for congestion.   tamsulosin (FLOMAX) 0.4 MG CAPS  capsule Take 1 capsule (0.4 mg total) by mouth daily.   tetrahydrozoline-zinc (VISINE-AC) 0.05-0.25 % ophthalmic solution Place 1-2 drops into both eyes 3 (three) times daily as needed (dry/irritated eyes.).   trimethoprim-polymyxin b (POLYTRIM) ophthalmic solution Apply 1 to 2 drops 4 times daily for 5 to 7 days   No facility-administered medications prior to visit.    ROS        Objective:     There were no vitals taken for this visit. {Vitals History (Optional):23777}  Physical Exam   No results found for any visits on 10/02/22. {Show previous labs (optional):23779}    Assessment & Plan:    Routine Health Maintenance and Physical Exam  Immunization History  Administered Date(s) Administered   Influenza,inj,Quad PF,6+ Mos 05/22/2022   Janssen (J&J) SARS-COV-2 Vaccination 10/11/2019, 01/19/2020   Tdap 02/27/2022   Zoster Recombinat (Shingrix) 02/27/2022    Health Maintenance  Topic Date Due   Zoster Vaccines- Shingrix (2 of 2) 04/24/2022   COLONOSCOPY (Pts 45-74yr Insurance coverage will need to be confirmed)  07/17/2031   DTaP/Tdap/Td (2 - Td or Tdap) 02/28/2032   INFLUENZA VACCINE  Completed   Hepatitis C Screening  Completed   HIV Screening  Completed   HPV VACCINES  Aged Out   COVID-19 Vaccine  Discontinued    Discussed health benefits of physical activity, and encouraged him to engage in regular  exercise appropriate for his age and condition.  Problem List Items Addressed This Visit   None  No follow-ups on file.     Johnette Abraham, MD

## 2022-10-14 ENCOUNTER — Ambulatory Visit: Payer: PRIVATE HEALTH INSURANCE | Admitting: Rheumatology

## 2022-10-14 DIAGNOSIS — J438 Other emphysema: Secondary | ICD-10-CM

## 2022-10-14 DIAGNOSIS — I270 Primary pulmonary hypertension: Secondary | ICD-10-CM

## 2022-10-14 DIAGNOSIS — N401 Enlarged prostate with lower urinary tract symptoms: Secondary | ICD-10-CM

## 2022-10-14 DIAGNOSIS — Z8679 Personal history of other diseases of the circulatory system: Secondary | ICD-10-CM

## 2022-10-14 DIAGNOSIS — F1010 Alcohol abuse, uncomplicated: Secondary | ICD-10-CM

## 2022-10-14 DIAGNOSIS — I2510 Atherosclerotic heart disease of native coronary artery without angina pectoris: Secondary | ICD-10-CM

## 2022-10-14 DIAGNOSIS — Z720 Tobacco use: Secondary | ICD-10-CM

## 2022-10-14 DIAGNOSIS — M450 Ankylosing spondylitis of multiple sites in spine: Secondary | ICD-10-CM

## 2022-10-14 DIAGNOSIS — Q2112 Patent foramen ovale: Secondary | ICD-10-CM

## 2022-10-14 DIAGNOSIS — K7030 Alcoholic cirrhosis of liver without ascites: Secondary | ICD-10-CM

## 2022-10-14 DIAGNOSIS — L409 Psoriasis, unspecified: Secondary | ICD-10-CM

## 2022-10-14 DIAGNOSIS — R7303 Prediabetes: Secondary | ICD-10-CM

## 2022-10-30 ENCOUNTER — Encounter: Payer: Self-pay | Admitting: Internal Medicine

## 2022-10-30 ENCOUNTER — Ambulatory Visit (INDEPENDENT_AMBULATORY_CARE_PROVIDER_SITE_OTHER): Payer: 59 | Admitting: Internal Medicine

## 2022-10-30 ENCOUNTER — Other Ambulatory Visit: Payer: Self-pay | Admitting: Internal Medicine

## 2022-10-30 VITALS — BP 146/80 | HR 60 | Ht 70.0 in | Wt 167.2 lb

## 2022-10-30 DIAGNOSIS — Z23 Encounter for immunization: Secondary | ICD-10-CM

## 2022-10-30 DIAGNOSIS — R351 Nocturia: Secondary | ICD-10-CM

## 2022-10-30 DIAGNOSIS — R030 Elevated blood-pressure reading, without diagnosis of hypertension: Secondary | ICD-10-CM | POA: Insufficient documentation

## 2022-10-30 DIAGNOSIS — J438 Other emphysema: Secondary | ICD-10-CM | POA: Diagnosis not present

## 2022-10-30 DIAGNOSIS — N401 Enlarged prostate with lower urinary tract symptoms: Secondary | ICD-10-CM | POA: Diagnosis not present

## 2022-10-30 DIAGNOSIS — Z720 Tobacco use: Secondary | ICD-10-CM

## 2022-10-30 DIAGNOSIS — M456 Ankylosing spondylitis lumbar region: Secondary | ICD-10-CM | POA: Diagnosis not present

## 2022-10-30 DIAGNOSIS — I4892 Unspecified atrial flutter: Secondary | ICD-10-CM | POA: Diagnosis not present

## 2022-10-30 DIAGNOSIS — Z0001 Encounter for general adult medical examination with abnormal findings: Secondary | ICD-10-CM

## 2022-10-30 MED ORDER — ALBUTEROL SULFATE HFA 108 (90 BASE) MCG/ACT IN AERS: 2.0000 | INHALATION_SPRAY | Freq: Four times a day (QID) | RESPIRATORY_TRACT | 0 refills | Status: AC | PRN

## 2022-10-30 NOTE — Progress Notes (Signed)
Complete physical exam  Patient: Jordan Kelly   DOB: [birth year]   62 y.o. Male  MRN: [id number]  Subjective:    Chief Complaint  Patient presents with   Annual Exam   Jordan Kelly is a 62 y.o. male who presents today for a complete physical exam. He reports consuming a general diet. Home exercise routine includes walking 1 hrs per day. He generally feels fairly well. He reports sleeping poorly. He does not have additional problems to discuss today.   Most recent fall risk assessment:    10/30/2022    1:41 PM  [place] in the past year? 0  Number falls in past yr: 0  Injury with Fall? 0  Risk for fall due to : No Fall Risks  Follow up Falls evaluation completed     Most recent depression screenings:    10/30/2022    1:41 PM 07/24/2022    1:28 PM  PHQ 2/9 Scores  PHQ - 2 Score 0 0  PHQ- 9 Score 4 0   Vision:Not within last year  and Dental: No current dental problems and No regular dental care   Past Medical History:  Diagnosis Date   Atrial flutter ([place])    a. s/p TEE/DCCV in 10/2020   CHF (congestive heart failure) ([place])    a. EF previously 45-50% in 10/2020 with cath in 01/2021 showing only minor CAD and normal right heart pressures   COPD (chronic obstructive pulmonary disease) ([facility])    Depression    Dysrhythmia    PFO (patent foramen ovale)    Psoriasis    Past Surgical History:  Procedure Laterality Date   BUBBLE STUDY  11/08/2020   Procedure: BUBBLE STUDY;  Surgeon: Jerline Pain, MD;  Location: [place];  Service: Cardiovascular;;   CARDIOVERSION N/A 11/08/2020   Procedure: CARDIOVERSION;  Surgeon: Jerline Pain, MD;  Location: [place];  Service: Cardiovascular;  Laterality: N/A;   COLONOSCOPY WITH PROPOFOL N/A 07/16/2021   Procedure: COLONOSCOPY WITH PROPOFOL;  Surgeon: Eloise Harman, DO;  [facility];  Service: Endoscopy;  Laterality: N/A;  10:30am   HERNIA REPAIR     right inguinal hernia   POLYPECTOMY  07/16/2021    Procedure: POLYPECTOMY;  Surgeon: Eloise Harman, DO;  [facility];  Service: Endoscopy;;   RIGHT/LEFT HEART CATH AND CORONARY ANGIOGRAPHY N/A 01/29/2021   Procedure: RIGHT/LEFT HEART CATH AND CORONARY ANGIOGRAPHY;  Surgeon: Sherren Mocha, MD;  Location: [place] [facility];  Service: Cardiovascular;  Laterality: N/A;   TEE WITHOUT CARDIOVERSION N/A 11/08/2020   Procedure: TRANSESOPHAGEAL ECHOCARDIOGRAM (TEE);  Surgeon: Jerline Pain, MD;  Location: [facility] ENDOSCOPY;  Service: Cardiovascular;  Laterality: N/A;   Social History   Tobacco Use   Smoking status: Every Day    Packs/day: .25    Types: Cigarettes   Smokeless tobacco: Never   Tobacco comments:    Smokes 5 cigarettes per day 04/02/21  Vaping Use   Vaping Use: Never used  Substance Use Topics   Alcohol use: Yes    Comment: 5-6 per week   Drug use: Yes    Types: Marijuana    Comment: daily   Family History  Problem Relation Age of Onset   COPD Father    No Known Allergies    Patient Care Team: Johnette Abraham, MD as PCP - General (Internal Medicine) Eloise Harman, DO as Consulting Physician (Gastroenterology)   Outpatient Medications Prior to  Visit  Medication Sig   bisoprolol (ZEBETA) 5 MG tablet Take 1 tablet (5 mg total) by mouth daily.   Multiple Vitamin (MULTIVITAMIN WITH MINERALS) TABS tablet Take 1 tablet by mouth daily.   tamsulosin (FLOMAX) 0.4 MG CAPS capsule Take 1 capsule (0.4 mg total) by mouth daily.   [DISCONTINUED] albuterol (VENTOLIN HFA) 108 (90 Base) MCG/ACT inhaler INHALE 2 PUFFS INTO THE LUNGS EVERY 6 HOURS AS NEEDED FOR WHEEZING OR SHORTNESS OF BREATH.   [DISCONTINUED] apixaban (ELIQUIS) 5 MG TABS tablet TAKE (1) TABLET BY MOUTH TWICE DAILY.   [DISCONTINUED] fluticasone-salmeterol (ADVAIR DISKUS) 250-50 MCG/ACT AEPB Inhale 1 puff into the lungs in the morning and at bedtime.   [DISCONTINUED] HYDROcodone-acetaminophen (NORCO/VICODIN) 5-325 MG tablet Take 1 tablet by mouth every 6  (six) hours as needed for up to 20 doses.   [DISCONTINUED] sodium chloride (OCEAN) 0.65 % SOLN nasal spray Place 1 spray into both nostrils as needed for congestion.   [DISCONTINUED] tetrahydrozoline-zinc (VISINE-AC) 0.05-0.25 % ophthalmic solution Place 1-2 drops into both eyes 3 (three) times daily as needed (dry/irritated eyes.).   [DISCONTINUED] trimethoprim-polymyxin b (POLYTRIM) ophthalmic solution Apply 1 to 2 drops 4 times daily for 5 to 7 days   No facility-administered medications prior to visit.   Review of Systems  Constitutional:  Negative for chills and fever.  HENT:  Negative for sore throat.   Respiratory:  Negative for cough and shortness of breath.   Cardiovascular:  Negative for chest pain, palpitations and leg swelling.  Gastrointestinal:  Negative for abdominal pain, blood in stool, constipation, diarrhea, nausea and vomiting.  Genitourinary:  Negative for dysuria and hematuria.  Musculoskeletal:  Positive for back pain (chronic lumbar back pain). Negative for myalgias.  Skin:  Negative for itching and rash.  Neurological:  Negative for dizziness and headaches.  Psychiatric/Behavioral:  Negative for depression and suicidal ideas.       Objective:     BP (!) 146/80   Pulse 60   Ht 5\' 10"  (1.778 m)   Wt 167 lb 3.2 oz (75.8 kg)   SpO2 91%   BMI 23.99 kg/m  BP Readings from Last 3 Encounters:  10/30/22 (!) 146/80  05/22/22 136/78  04/11/22 124/77   Physical Exam Vitals reviewed.  Constitutional:      General: He is not in acute distress.    Appearance: Normal appearance. He is not ill-appearing.  HENT:     Head: Normocephalic and atraumatic.     Right Ear: External ear normal.     Left Ear: External ear normal.     Nose: Nose normal. No congestion or rhinorrhea.     Mouth/Throat:     Mouth: Mucous membranes are moist.     Pharynx: Oropharynx is clear.  Eyes:     General: No scleral icterus.    Extraocular Movements: Extraocular movements intact.      Conjunctiva/sclera: Conjunctivae normal.     Pupils: Pupils are equal, round, and reactive to light.  Cardiovascular:     Rate and Rhythm: Normal rate and regular rhythm.     Pulses: Normal pulses.     Heart sounds: Normal heart sounds. No murmur heard. Pulmonary:     Effort: Pulmonary effort is normal.     Breath sounds: Normal breath sounds. No wheezing, rhonchi or rales.  Abdominal:     General: Abdomen is flat. Bowel sounds are normal. There is no distension.     Palpations: Abdomen is soft.     Tenderness: There is  no abdominal tenderness.  Musculoskeletal:        General: Tenderness (Lumbar region, paraspinal muscles bilaterally) present. No swelling or deformity.     Cervical back: Normal range of motion.     Comments: Forward flexion and extension are limited at the waist.  Lateral movements are generally intact.  Skin:    General: Skin is warm and dry.     Capillary Refill: Capillary refill takes less than 2 seconds.  Neurological:     General: No focal deficit present.     Mental Status: He is alert and oriented to person, place, and time.     Motor: No weakness.  Psychiatric:        Mood and Affect: Mood normal.        Behavior: Behavior normal.        Thought Content: Thought content normal.   Last CBC Lab Results  Component Value Date   WBC 9.8 03/06/2022   HGB 14.6 03/06/2022   HCT 44.7 03/06/2022   MCV 104.9 (H) 03/06/2022   MCH 34.3 (H) 03/06/2022   RDW 12.9 03/06/2022   PLT 330 [id number]   Last metabolic panel Lab Results  Component Value Date   GLUCOSE 86 03/06/2022   NA 133 (L) 03/06/2022   K 4.6 03/06/2022   CL 100 03/06/2022   CO2 26 03/06/2022   BUN 10 03/06/2022   CREATININE 0.59 (L) 03/06/2022   GFRNONAA >60 03/06/2022   CALCIUM 9.0 03/06/2022   PHOS 4.2 11/11/2020   PROT 7.7 03/06/2022   ALBUMIN 3.7 03/06/2022   LABGLOB 3.2 01/30/2022   AGRATIO 1.3 01/30/2022   BILITOT 0.8 03/06/2022   ALKPHOS 110 03/06/2022   AST 17 03/06/2022    ALT 16 03/06/2022   ANIONGAP 7 03/06/2022   Last lipids Lab Results  Component Value Date   CHOL 133 01/30/2022   HDL 83 01/30/2022   LDLCALC 41 01/30/2022   TRIG 35 01/30/2022   CHOLHDL 1.6 01/30/2022   Last hemoglobin A1c Lab Results  Component Value Date   HGBA1C 5.4 01/30/2022   Last thyroid functions Lab Results  Component Value Date   TSH 1.843 11/05/2020   Last vitamin D No results found for: "25OHVITD2", "25OHVITD3", "VD25OH" Last vitamin B12 and Folate Lab Results  Component Value Date   VITAMINB12 1,562 (H) 11/07/2020   FOLATE 22.2 11/07/2020        Assessment & Plan:    Routine Health Maintenance and Physical Exam  Immunization History  Administered Date(s) Administered   Influenza,inj,Quad PF,6+ Mos 05/22/2022   Janssen (J&J) SARS-COV-2 Vaccination 10/11/2019, 01/19/2020   Tdap 02/27/2022   Zoster Recombinat (Shingrix) 02/27/2022    Health Maintenance  Topic Date Due   Zoster Vaccines- Shingrix (2 of 2) 04/24/2022   COLONOSCOPY (Pts 45-66yrs Insurance coverage will need to be confirmed)  07/17/2031   DTaP/Tdap/Td (2 - Td or Tdap) 02/28/2032   INFLUENZA VACCINE  Completed   Hepatitis C Screening  Completed   HIV Screening  Completed   HPV VACCINES  Aged Out   COVID-19 Vaccine  Discontinued    Discussed health benefits of physical activity, and encouraged him to engage in regular exercise appropriate for his age and condition.  Problem List Items Addressed This Visit       Atrial flutter [facility])    S/p successful TEE/DCCV in March 2022.  Regular rate and rhythm detected on exam today.  He is currently on bisoprolol 5 mg daily but has stopped taking Eliquis.  CHA2DS2-VASc score 1. -No medication changes today.  Continue bisoprolol.  Okay to discontinue Eliquis per prior cardiology documentation.      Emphysema/COPD [facility]) - Primary    Asymptomatic currently.  Pulmonary exam today is unremarkable.  He is currently using albuterol as needed,  roughly 1 time per week.  Previously on Advair, but has stopped taking the medication because it made him feel lightheaded. -No medication changes today -Complete smoking cessation has been encouraged      Ankylosing spondylitis lumbar region [facility])    He continues to endorse chronic back pain.  Previously referred to rheumatology but did not attend appointments due to transportation issues.  He remains interested in -Will ask for previously placed rheumatology referral to be re-opened      Tobacco abuse    He has cut back to 2 cigarettes/day.  Complete cessation has been encouraged. -The patient was counseled on the dangers of tobacco use, and was advised to quit.  Reviewed strategies to maximize success, including removing cigarettes and smoking materials from environment, stress management, substitution of other forms of reinforcement, and support of family/friends.       BPH associated with nocturia    Symptoms remain well-controlled with Flomax. -No medication changes today      Elevated blood pressure reading    No prior history of hypertension.  BP elevated today at 150/79 initially and 146/80 on repeat.  He attributes his elevated readings to back pain. -No medication changes today.  Continue to monitor BP at subsequent appointments.      Encounter for well adult exam with abnormal findings    Presenting today to establish care -Labs from July 2023 reviewed today.  Plan to repeat labs at follow-up in 6 months -Second zoster vaccine administered today -Additional preventative care items are up-to-date -We will tentatively plan for follow-up in 6 months      Return in about 6 months (around 05/02/2023).  Johnette Abraham, MD

## 2022-10-30 NOTE — Assessment & Plan Note (Signed)
Asymptomatic currently.  Pulmonary exam today is unremarkable.  He is currently using albuterol as needed, roughly 1 time per week.  Previously on Advair, but has stopped taking the medication because it made him feel lightheaded. -No medication changes today -Complete smoking cessation has been encouraged

## 2022-10-30 NOTE — Patient Instructions (Signed)
It was a pleasure to see you today.  Thank you for giving [place] the opportunity to be involved in your care.  Below is a brief recap of your visit and next steps.  We will plan to see you again in 6 months.  Summary No medication changes today You will receive your second shingles vaccine Albuterol inhaler refilled Will re-open rheumatology referral Follow up in 6 months

## 2022-10-30 NOTE — Assessment & Plan Note (Signed)
Presenting today to establish care -Labs from July 2023 reviewed today.  Plan to repeat labs at follow-up in 6 months -Second zoster vaccine administered today -Additional preventative care items are up-to-date -We will tentatively plan for follow-up in 6 months

## 2022-10-30 NOTE — Assessment & Plan Note (Signed)
S/p successful TEE/DCCV in March 2022.  Regular rate and rhythm detected on exam today.  He is currently on bisoprolol 5 mg daily but has stopped taking Eliquis.  CHA2DS2-VASc score 1. -No medication changes today.  Continue bisoprolol.  Okay to discontinue Eliquis per prior cardiology documentation.

## 2022-10-30 NOTE — Assessment & Plan Note (Signed)
Symptoms remain well-controlled with Flomax. -No medication changes today

## 2022-10-30 NOTE — Assessment & Plan Note (Signed)
He has cut back to 2 cigarettes/day.  Complete cessation has been encouraged. -The patient was counseled on the dangers of tobacco use, and was advised to quit.  Reviewed strategies to maximize success, including removing cigarettes and smoking materials from environment, stress management, substitution of other forms of reinforcement, and support of family/friends.

## 2022-10-30 NOTE — Assessment & Plan Note (Signed)
No prior history of hypertension.  BP elevated today at 150/79 initially and 146/80 on repeat.  He attributes his elevated readings to back pain. -No medication changes today.  Continue to monitor BP at subsequent appointments.

## 2022-10-30 NOTE — Assessment & Plan Note (Signed)
He continues to endorse chronic back pain.  Previously referred to rheumatology but did not attend appointments due to transportation issues.  He remains interested in -Will ask for previously placed rheumatology referral to be re-opened

## 2022-10-31 ENCOUNTER — Other Ambulatory Visit: Payer: Self-pay | Admitting: Internal Medicine

## 2022-10-31 DIAGNOSIS — M456 Ankylosing spondylitis lumbar region: Secondary | ICD-10-CM

## 2022-11-05 ENCOUNTER — Telehealth: Payer: Self-pay | Admitting: Internal Medicine

## 2022-11-05 ENCOUNTER — Ambulatory Visit: Payer: PRIVATE HEALTH INSURANCE | Admitting: Rheumatology

## 2022-11-05 NOTE — Telephone Encounter (Signed)
Per Lovey Newcomer at [facility], they do not take [employer]. Only Dr. Benjamine Mola & Dr. Dora Sims accept his insu. Can you please refer to either one?

## 2022-12-04 ENCOUNTER — Ambulatory Visit: Payer: 59 | Admitting: Rheumatology

## 2023-05-07 ENCOUNTER — Ambulatory Visit: Payer: 59 | Admitting: Internal Medicine

## 2024-02-18 ENCOUNTER — Other Ambulatory Visit: Payer: Self-pay

## 2024-02-18 ENCOUNTER — Emergency Department (HOSPITAL_COMMUNITY)
Admission: EM | Admit: 2024-02-18 | Discharge: 2024-02-18 | Disposition: A | Discharge status: Home / Self Care | Payer: Self-pay | Attending: Student | Admitting: Student

## 2024-02-18 ENCOUNTER — Emergency Department (HOSPITAL_COMMUNITY): Payer: Self-pay

## 2024-02-18 ENCOUNTER — Encounter (HOSPITAL_COMMUNITY): Payer: Self-pay

## 2024-02-18 DIAGNOSIS — I2510 Atherosclerotic heart disease of native coronary artery without angina pectoris: Secondary | ICD-10-CM | POA: Diagnosis not present

## 2024-02-18 DIAGNOSIS — F17210 Nicotine dependence, cigarettes, uncomplicated: Secondary | ICD-10-CM | POA: Insufficient documentation

## 2024-02-18 DIAGNOSIS — J449 Chronic obstructive pulmonary disease, unspecified: Secondary | ICD-10-CM | POA: Diagnosis not present

## 2024-02-18 DIAGNOSIS — I509 Heart failure, unspecified: Secondary | ICD-10-CM | POA: Diagnosis not present

## 2024-02-18 DIAGNOSIS — R1031 Right lower quadrant pain: Secondary | ICD-10-CM | POA: Insufficient documentation

## 2024-02-18 LAB — CBC WITH DIFFERENTIAL/PLATELET
Abs Immature Granulocytes: 0.03 K/uL (ref 0.00–0.07)
Basophils Absolute: 0.1 K/uL (ref 0.0–0.1)
Basophils Relative: 1 %
Eosinophils Absolute: 0.2 K/uL (ref 0.0–0.5)
Eosinophils Relative: 2 %
HCT: 44.4 % (ref 39.0–52.0)
Hemoglobin: 15.3 g/dL (ref 13.0–17.0)
Immature Granulocytes: 0 %
Lymphocytes Relative: 21 %
Lymphs Abs: 2 K/uL (ref 0.7–4.0)
MCH: 35.8 pg — ABNORMAL HIGH (ref 26.0–34.0)
MCHC: 34.5 g/dL (ref 30.0–36.0)
MCV: 104 fL — ABNORMAL HIGH (ref 80.0–100.0)
Monocytes Absolute: 0.7 K/uL (ref 0.1–1.0)
Monocytes Relative: 8 %
Neutro Abs: 6.3 K/uL (ref 1.7–7.7)
Neutrophils Relative %: 68 %
Platelets: 287 K/uL (ref 150–400)
RBC: 4.27 MIL/uL (ref 4.22–5.81)
RDW: 12.6 % (ref 11.5–15.5)
WBC: 9.2 K/uL (ref 4.0–10.5)
nRBC: 0 % (ref 0.0–0.2)

## 2024-02-18 LAB — COMPREHENSIVE METABOLIC PANEL WITH GFR
ALT: 18 U/L (ref 0–44)
AST: 19 U/L (ref 15–41)
Albumin: 3.8 g/dL (ref 3.5–5.0)
Alkaline Phosphatase: 72 U/L (ref 38–126)
Anion gap: 11 (ref 5–15)
BUN: 8 mg/dL (ref 6–20)
CO2: 23 mmol/L (ref 22–32)
Calcium: 8.9 mg/dL (ref 8.9–10.3)
Chloride: 96 mmol/L — ABNORMAL LOW (ref 98–111)
Creatinine, Ser: 0.52 mg/dL — ABNORMAL LOW (ref 0.61–1.24)
GFR, Estimated: >60 mL/min (ref >60)
Glucose, Bld: 93 mg/dL (ref 70–99)
Potassium: 4.5 mmol/L (ref 3.5–5.1)
Sodium: 130 mmol/L — ABNORMAL LOW (ref 135–145)
Total Bilirubin: 0.6 mg/dL (ref 0.0–1.2)
Total Protein: 7.5 g/dL (ref 6.5–8.1)

## 2024-02-18 LAB — LIPASE, BLOOD: Lipase: 40 U/L (ref 11–51)

## 2024-02-18 LAB — URINALYSIS, ROUTINE W REFLEX MICROSCOPIC
Bilirubin Urine: NEGATIVE
Glucose, UA: NEGATIVE mg/dL
Hgb urine dipstick: NEGATIVE
Ketones, ur: NEGATIVE mg/dL
Leukocytes,Ua: NEGATIVE
Nitrite: NEGATIVE
Protein, ur: NEGATIVE mg/dL
Specific Gravity, Urine: 1.009 (ref 1.005–1.030)
pH: 5 (ref 5.0–8.0)

## 2024-02-18 MED ORDER — IOHEXOL 300 MG/ML  SOLN
100.0000 mL | Freq: Once | INTRAMUSCULAR | Status: AC | PRN
  Administered 2024-02-18: 100 mL via INTRAVENOUS

## 2024-02-18 MED ORDER — NAPROXEN 375 MG PO TABS: 375.0000 mg | ORAL_TABLET | Freq: Two times a day (BID) | ORAL | 0 refills | Status: DC

## 2024-02-18 MED ORDER — MORPHINE SULFATE (PF) 4 MG/ML IV SOLN
4.0000 mg | Freq: Once | INTRAVENOUS | Status: AC
  Administered 2024-02-18: 4 mg via INTRAVENOUS
  Filled 2024-02-18: qty 1

## 2024-02-18 MED ORDER — LACTATED RINGERS IV BOLUS
1000.0000 mL | Freq: Once | INTRAVENOUS | Status: AC
  Administered 2024-02-18: 1000 mL via INTRAVENOUS

## 2024-02-18 MED ORDER — ONDANSETRON HCL 4 MG/2ML IJ SOLN
4.0000 mg | Freq: Once | INTRAMUSCULAR | Status: AC
  Administered 2024-02-18: 4 mg via INTRAVENOUS
  Filled 2024-02-18: qty 2

## 2024-02-18 MED ORDER — HYDROCODONE-ACETAMINOPHEN 5-325 MG PO TABS
2.0000 | ORAL_TABLET | ORAL | 0 refills | Status: DC | PRN
Start: 2024-02-18 — End: 2024-03-23

## 2024-02-18 MED ORDER — HYDROCODONE-ACETAMINOPHEN 5-325 MG PO TABS: 2.0000 | ORAL_TABLET | ORAL | 0 refills | Status: DC | PRN

## 2024-02-18 NOTE — ED Notes (Signed)
 Patient transported to CT

## 2024-02-18 NOTE — ED Notes (Signed)
Pt instructed to get urine sample  

## 2024-02-18 NOTE — ED Triage Notes (Addendum)
 Reports had inguinal hernia surgery in 2006 but 2-3 days ago lifted something and started having pain where he had surgery.  Reports having fever nausea with it.  Reports has had bm but its painful.    Reports he had a fever 101 but took some tyenol.

## 2024-02-18 NOTE — ED Provider Notes (Signed)
 [place] EMERGENCY DEPARTMENT AT [facility] Provider Note  CSN: [id number] Arrival date & time: 02/18/24 1315  Chief Complaint(s) Inguinal Hernia  HPI Jordan Kelly is a 63 y.o. male with PMH a flutter status post cardioversion, CHF, COPD, PFO, previous right in 2006 who presents emerged part for evaluation of right lower quadrant abdominal pain.  States that if he is on his feet for 4 to 5 hours at a time he will get a sharp pain in the area where his previous hernia was.  States that this feels very similar to his previous hernia.  Is endorsing persistent lower quadrant pain here in the emergency department but denies associated nausea, vomiting, chest pain, shortness of breath or other systemic symptoms.   Past Medical History Past Medical History:  Diagnosis Date   Atrial flutter (HCC)    a. s/p TEE/DCCV in 10/2020   CHF (congestive heart failure) (HCC)    a. EF previously 45-50% in 10/2020 with cath in 01/2021 showing only minor CAD and normal right heart pressures   COPD (chronic obstructive pulmonary disease) (HCC)    Depression    Dysrhythmia    PFO (patent foramen ovale)    Psoriasis    Patient Active Problem List   Diagnosis Date Noted   Elevated blood pressure reading 10/30/2022   Encounter for well adult exam with abnormal findings 10/30/2022   Need for influenza vaccination 05/22/2022   Ankylosing spondylitis lumbar region (HCC) 03/27/2022   Right inguinal hernia 03/27/2022   Bilateral flank pain 02/27/2022   Prediabetes 01/25/2022   Chronic bilateral low back pain without sciatica 01/25/2022   BPH associated with nocturia 01/25/2022   Sore throat 09/03/2021   Acute recurrent sinusitis 09/03/2021   Depression, major, single episode, severe (HCC) 09/03/2021   Refused influenza vaccine 09/03/2021   CAD (coronary artery disease) 06/11/2021   Left ear impacted cerumen 03/20/2021   Tinea cruris 03/13/2021   Atrial flutter (HCC) 03/13/2021   Abnormal  liver diagnostic imaging 12/12/2020   Pulmonary hypertension, primary (HCC) 11/28/2020   PFO (patent foramen ovale) 11/14/2020   Cirrhosis (HCC) 11/14/2020   Psoriasis 11/14/2020   AKI (acute kidney injury) (HCC) 11/14/2020   Congestive heart failure (CHF) (HCC) 11/04/2020   Encounter to establish care 10/30/2020   Emphysema/COPD (HCC) 10/30/2020   Constipation 10/30/2020   Nausea 10/30/2020   Tobacco abuse 01/09/2019   Alcohol abuse 01/09/2019   Home Medication(s) Prior to Admission medications   Medication Sig Start Date End Date Taking? Authorizing Provider  albuterol  (VENTOLIN  HFA) 108 (90 Base) MCG/ACT inhaler Inhale 2 puffs into the lungs every 6 (six) hours as needed for wheezing or shortness of breath. 10/30/22   Melvenia Manus BRAVO, MD  bisoprolol  (ZEBETA ) 5 MG tablet Take 1 tablet (5 mg total) by mouth daily. 03/22/22   Strader, Brittany M, PA-C  Multiple Vitamin (MULTIVITAMIN WITH MINERALS) TABS tablet Take 1 tablet by mouth daily. 01/10/19   Pearlean Manus, MD  tamsulosin  (FLOMAX ) 0.4 MG CAPS capsule Take 1 capsule (0.4 mg total) by mouth daily. 01/25/22   Paseda, Folashade R, FNP  Past Surgical History Past Surgical History:  Procedure Laterality Date   BUBBLE STUDY  11/08/2020   Procedure: BUBBLE STUDY;  Surgeon: Jeffrie Oneil BROCKS, MD;  Location: [facility] ENDOSCOPY;  Service: Cardiovascular;;   CARDIOVERSION N/A 11/08/2020   Procedure: CARDIOVERSION;  Surgeon: Jeffrie Oneil BROCKS, MD;  Location: [facility] ENDOSCOPY;  Service: Cardiovascular;  Laterality: N/A;   COLONOSCOPY WITH PROPOFOL  N/A 07/16/2021   Procedure: COLONOSCOPY WITH PROPOFOL ;  Surgeon: Cindie Carlin POUR, DO;  Location: [facility] ENDO SUITE;  Service: Endoscopy;  Laterality: N/A;  10:30am   HERNIA REPAIR     right inguinal hernia   POLYPECTOMY  07/16/2021   Procedure: POLYPECTOMY;  Surgeon: Cindie Carlin POUR, DO;   Location: [facility] ENDO SUITE;  Service: Endoscopy;;   RIGHT/LEFT HEART CATH AND CORONARY ANGIOGRAPHY N/A 01/29/2021   Procedure: RIGHT/LEFT HEART CATH AND CORONARY ANGIOGRAPHY;  Surgeon: Wonda Sharper, MD;  Location: [facility] INVASIVE CV LAB;  Service: Cardiovascular;  Laterality: N/A;   TEE WITHOUT CARDIOVERSION N/A 11/08/2020   Procedure: TRANSESOPHAGEAL ECHOCARDIOGRAM (TEE);  Surgeon: Jeffrie Oneil BROCKS, MD;  Location: [facility] ENDOSCOPY;  Service: Cardiovascular;  Laterality: N/A;   Family History Family History  Problem Relation Age of Onset   COPD Father     Social History Social History   Tobacco Use   Smoking status: Every Day    Current packs/day: 0.25    Types: Cigarettes   Smokeless tobacco: Never   Tobacco comments:    Smokes 5 cigarettes per day 04/02/21  Vaping Use   Vaping status: Never Used  Substance Use Topics   Alcohol use: Yes    Comment: 5-6 per week   Drug use: Yes    Types: Marijuana    Comment: daily   Allergies Patient has no known allergies.  Review of Systems Review of Systems  Gastrointestinal:  Positive for abdominal pain.    Physical Exam Vital Signs  I have reviewed the triage vital signs BP 120/65   Pulse 73   Temp 98 F (36.7 C) (Oral)   Resp 16   Ht 5' 10 (1.778 m)   Wt 75.8 kg   SpO2 94%   BMI 23.96 kg/m   Physical Exam Constitutional:      General: He is not in acute distress.    Appearance: Normal appearance.  HENT:     Head: Normocephalic and atraumatic.     Nose: No congestion or rhinorrhea.  Eyes:     General:        Right eye: No discharge.        Left eye: No discharge.     Extraocular Movements: Extraocular movements intact.     Pupils: Pupils are equal, round, and reactive to light.  Cardiovascular:     Rate and Rhythm: Normal rate and regular rhythm.     Heart sounds: No murmur Kelly. Pulmonary:     Effort: No respiratory distress.     Breath sounds: No wheezing or rales.  Abdominal:     General: There is no distension.      Tenderness: There is abdominal tenderness.  Musculoskeletal:        General: Normal range of motion.     Cervical back: Normal range of motion.  Skin:    General: Skin is warm and dry.  Neurological:     General: No focal deficit present.     Mental Status: He is alert.     ED Results and Treatments Labs (all labs ordered are listed, but only abnormal results are  displayed) Labs Reviewed  COMPREHENSIVE METABOLIC PANEL WITH GFR - Abnormal; Notable for the following components:      Result Value   Sodium 130 (*)    Chloride 96 (*)    Creatinine, Ser 0.52 (*)    All other components within normal limits  CBC WITH DIFFERENTIAL/PLATELET - Abnormal; Notable for the following components:   MCV 104.0 (*)    MCH 35.8 (*)    All other components within normal limits  LIPASE, BLOOD  URINALYSIS, ROUTINE W REFLEX MICROSCOPIC                                                                                                                          Radiology CT ABDOMEN PELVIS W CONTRAST Result Date: 02/18/2024 CLINICAL DATA:  RLQ abdominal pain EXAM: CT ABDOMEN AND PELVIS WITH CONTRAST TECHNIQUE: Multidetector CT imaging of the abdomen and pelvis was performed using the standard protocol following bolus administration of intravenous contrast. RADIATION DOSE REDUCTION: This exam was performed according to the departmental dose-optimization program which includes automated exposure control, adjustment of the mA and/or kV according to patient size and/or use of iterative reconstruction technique. CONTRAST:  OMNIPAQUE  IOHEXOL  300 MG/ML  SOLN COMPARISON:  CT scan renal stone protocol from 03/06/2022. FINDINGS: Lower chest: There are subpleural atelectatic changes in the visualized lung bases. No overt consolidation. No pleural effusion. The heart is normal in size. No pericardial effusion. Hepatobiliary: The liver is normal in size. Non-cirrhotic configuration. These is diffuse hepatic steatosis.  No suspicious mass. Note is made of a 5 mm hypoattenuating focus in the left hepatic lobe, segment 4B, which is too small to adequately characterize but stable since the prior study and favored benign in etiology. No intrahepatic or extrahepatic bile duct dilation. No calcified gallstones. Normal gallbladder wall thickness. No pericholecystic inflammatory changes. Pancreas: Unremarkable. No pancreatic ductal dilatation or surrounding inflammatory changes. Spleen: Within normal limits. No focal lesion. Adrenals/Urinary Tract: Adrenal glands are unremarkable. No suspicious renal mass. No hydronephrosis. No renal or ureteric calculi. Unremarkable urinary bladder. Stomach/Bowel: No disproportionate dilation of the small or large bowel loops. No evidence of abnormal bowel wall thickening or inflammatory changes. The appendix is in the right upper quadrant and appears unremarkable. Vascular/Lymphatic: No ascites or pneumoperitoneum. No abdominal or pelvic lymphadenopathy, by size criteria. No aneurysmal dilation of the major abdominal arteries. There are moderate peripheral atherosclerotic vascular calcifications of the aorta and its major branches. Reproductive: Normal size prostate. Symmetric seminal vesicles. Other: There are fat containing umbilical and bilateral inguinal hernias. The soft tissues and abdominal wall are otherwise unremarkable. Musculoskeletal: No suspicious osseous lesions. There are mild - moderate multilevel degenerative changes in the visualized spine. Redemonstration of bilateral superior SI joint sclerosis as well as sclerosis of the corners of the vertebra, favoring ankylosing spondylitis. IMPRESSION: 1. No acute inflammatory process identified within the abdomen or pelvis. 2. Multiple other nonacute observations, as described above. Aortic Atherosclerosis (ICD10-I70.0). Electronically Signed   By: Ree  Kimberlee M.D.   On: 02/18/2024 16:20    Pertinent labs & imaging results that were  available during my care of the patient were reviewed by me and considered in my medical decision making (see MDM for details).  Medications Ordered in ED Medications  iohexol  (OMNIPAQUE ) 300 MG/ML solution 100 mL (100 mLs Intravenous Contrast Given 02/18/24 1554)  morphine  (PF) 4 MG/ML injection 4 mg (4 mg Intravenous Given 02/18/24 1611)  ondansetron  (ZOFRAN ) injection 4 mg (4 mg Intravenous Given 02/18/24 1609)  lactated ringers  bolus 1,000 mL (1,000 mLs Intravenous New Bag/Given 02/18/24 1613)                                                                                                                                     Procedures Procedures  (including critical care time)  Medical Decision Making / ED Course   This patient presents to the ED for concern of ***, this involves an extensive number of treatment options, and is a complaint that carries with it a high risk of complications and morbidity.  The differential diagnosis includes ***  MDM: ***   Additional history obtained: -Additional history obtained from *** -External records from outside source obtained and reviewed including: Chart review including previous notes, labs, imaging, consultation notes   Lab Tests: -I ordered, reviewed, and interpreted labs.   The pertinent results include:   Labs Reviewed  COMPREHENSIVE METABOLIC PANEL WITH GFR - Abnormal; Notable for the following components:      Result Value   Sodium 130 (*)    Chloride 96 (*)    Creatinine, Ser 0.52 (*)    All other components within normal limits  CBC WITH DIFFERENTIAL/PLATELET - Abnormal; Notable for the following components:   MCV 104.0 (*)    MCH 35.8 (*)    All other components within normal limits  LIPASE, BLOOD  URINALYSIS, ROUTINE W REFLEX MICROSCOPIC      EKG ***  EKG Interpretation Date/Time:    Ventricular Rate:    PR Interval:    QRS Duration:    QT Interval:    QTC Calculation:   R Axis:      Text Interpretation:            Imaging Studies ordered: I ordered imaging studies including *** I independently visualized and interpreted imaging. I agree with the radiologist interpretation   Medicines ordered and prescription drug management: Meds ordered this encounter  Medications   iohexol  (OMNIPAQUE ) 300 MG/ML solution 100 mL   morphine  (PF) 4 MG/ML injection 4 mg    Refill:  0   ondansetron  (ZOFRAN ) injection 4 mg   lactated ringers  bolus 1,000 mL    -I have reviewed the patients home medicines and have made adjustments as needed  Critical interventions ***  Consultations Obtained: I requested consultation with the ***,  and discussed lab and imaging findings as well as pertinent plan - they recommend: ***  Cardiac Monitoring: The patient was maintained on a cardiac monitor.  I personally viewed and interpreted the cardiac monitored which showed an underlying rhythm of: ***  Social Determinants of Health:  Factors impacting patients care include: ***   Reevaluation: After the interventions noted above, I reevaluated the patient and found that they have :{resolved/improved/worsened:23923::improved}  Co morbidities that complicate the patient evaluation  Past Medical History:  Diagnosis Date   Atrial flutter (HCC)    a. s/p TEE/DCCV in 10/2020   CHF (congestive heart failure) (HCC)    a. EF previously 45-50% in 10/2020 with cath in 01/2021 showing only minor CAD and normal right heart pressures   COPD (chronic obstructive pulmonary disease) (HCC)    Depression    Dysrhythmia    PFO (patent foramen ovale)    Psoriasis       Dispostion: I considered admission for this patient, ***     Final Clinical Impression(s) / ED Diagnoses Final diagnoses:  None     @PCDICTATION @

## 2024-02-20 MED FILL — Hydrocodone-Acetaminophen Tab 5-325 MG: ORAL | Qty: 6 | Status: AC

## 2024-02-23 ENCOUNTER — Encounter: Payer: Self-pay | Admitting: *Deleted

## 2024-03-17 ENCOUNTER — Ambulatory Visit: Admitting: Student

## 2024-03-23 ENCOUNTER — Ambulatory Visit (INDEPENDENT_AMBULATORY_CARE_PROVIDER_SITE_OTHER): Admitting: General Surgery

## 2024-03-23 ENCOUNTER — Encounter: Payer: Self-pay | Admitting: General Surgery

## 2024-03-23 ENCOUNTER — Other Ambulatory Visit: Payer: Self-pay

## 2024-03-23 VITALS — BP 141/84 | HR 69 | Temp 97.7°F | Resp 18 | Ht 71.0 in | Wt 173.4 lb

## 2024-03-23 DIAGNOSIS — K4090 Unilateral inguinal hernia, without obstruction or gangrene, not specified as recurrent: Secondary | ICD-10-CM | POA: Diagnosis not present

## 2024-03-23 NOTE — Progress Notes (Signed)
 Jordan Kelly; [id number]; 1961/06/08   HPI Patient is a 63 year old white male who returns back to my care for evaluation and treatment of his right inguinal hernia.  Since I last saw him in 2023, he has undergone cardiac ablation for his atrial fibrillation.  He is off Eliquis  at the present time.  He missed his follow-up appointment with cardiology recently.  He states he is going back to see his cardiologist.  He was recently seen in the emergency room for right groin pain and was told that he has a hernia.  He states the right inguinal hernia seems to have worsened since I last saw him.  He denies any incarceration.  He states he still drinks a couple of beers a day. Past Medical History:  Diagnosis Date   Atrial flutter (HCC)    a. s/p TEE/DCCV in 10/2020   CHF (congestive heart failure) (HCC)    a. EF previously 45-50% in 10/2020 with cath in 01/2021 showing only minor CAD and normal right heart pressures   COPD (chronic obstructive pulmonary disease) (HCC)    Depression    Dysrhythmia    PFO (patent foramen ovale)    Psoriasis     Past Surgical History:  Procedure Laterality Date   BUBBLE STUDY  11/08/2020   Procedure: BUBBLE STUDY;  Surgeon: Jeffrie Oneil BROCKS, MD;  Location: [facility] ENDOSCOPY;  Service: Cardiovascular;;   CARDIOVERSION N/A 11/08/2020   Procedure: CARDIOVERSION;  Surgeon: Jeffrie Oneil BROCKS, MD;  Location: [facility] ENDOSCOPY;  Service: Cardiovascular;  Laterality: N/A;   COLONOSCOPY WITH PROPOFOL  N/A 07/16/2021   Procedure: COLONOSCOPY WITH PROPOFOL ;  Surgeon: Cindie Carlin POUR, DO;  Location: [facility] ENDO SUITE;  Service: Endoscopy;  Laterality: N/A;  10:30am   HERNIA REPAIR     right inguinal hernia   POLYPECTOMY  07/16/2021   Procedure: POLYPECTOMY;  Surgeon: Cindie Carlin POUR, DO;  Location: [facility] ENDO SUITE;  Service: Endoscopy;;   RIGHT/LEFT HEART CATH AND CORONARY ANGIOGRAPHY N/A 01/29/2021   Procedure: RIGHT/LEFT HEART CATH AND CORONARY ANGIOGRAPHY;  Surgeon: Wonda Sharper, MD;   Location: [facility] INVASIVE CV LAB;  Service: Cardiovascular;  Laterality: N/A;   TEE WITHOUT CARDIOVERSION N/A 11/08/2020   Procedure: TRANSESOPHAGEAL ECHOCARDIOGRAM (TEE);  Surgeon: Jeffrie Oneil BROCKS, MD;  Location: [facility] ENDOSCOPY;  Service: Cardiovascular;  Laterality: N/A;    Family History  Problem Relation Age of Onset   COPD Father     Current Outpatient Medications on File Prior to Visit  Medication Sig Dispense Refill   albuterol  (VENTOLIN  HFA) 108 (90 Base) MCG/ACT inhaler Inhale 2 puffs into the lungs every 6 (six) hours as needed for wheezing or shortness of breath. 18 g 0   bisoprolol  (ZEBETA ) 5 MG tablet Take 1 tablet (5 mg total) by mouth daily. 90 tablet 3   Multiple Vitamin (MULTIVITAMIN WITH MINERALS) TABS tablet Take 1 tablet by mouth daily. 30 tablet 3   naproxen  (NAPROSYN ) 375 MG tablet Take 1 tablet (375 mg total) by mouth 2 (two) times daily. 20 tablet 0   tamsulosin  (FLOMAX ) 0.4 MG CAPS capsule Take 1 capsule (0.4 mg total) by mouth daily. 30 capsule 3   No current facility-administered medications on file prior to visit.    No Known Allergies  Social History   Substance and Sexual Activity  Alcohol Use Yes   Comment: 5-6 per week    Social History   Tobacco Use  Smoking Status Every Day   Current packs/day: 0.25   Types: Cigarettes   Passive  exposure: Never  Smokeless Tobacco Never  Tobacco Comments   Smokes 5 cigarettes per day 04/02/21    Review of Systems  Constitutional: Negative.   HENT: Negative.    Eyes: Negative.   Respiratory:  Positive for wheezing.   Cardiovascular: Negative.   Gastrointestinal:  Positive for abdominal pain.  Genitourinary: Negative.   Musculoskeletal:  Positive for back pain.  Skin: Negative.   Neurological: Negative.   Endo/Heme/Allergies: Negative.   Psychiatric/Behavioral: Negative.      Objective   Vitals:   03/23/24 1538 03/23/24 1541  BP: (!) 141/85 (!) 141/84  Pulse: 69   Resp: 18   Temp: 97.7 F (36.5 C)    SpO2: 91%     Physical Exam Vitals reviewed.  Constitutional:      Appearance: Normal appearance. He is normal weight. He is not ill-appearing.  HENT:     Head: Normocephalic and atraumatic.  Cardiovascular:     Rate and Rhythm: Normal rate and regular rhythm.     Heart sounds: Normal heart sounds. No murmur heard.    No friction rub. No gallop.  Pulmonary:     Effort: Pulmonary effort is normal. No respiratory distress.     Breath sounds: Normal breath sounds. No stridor. No wheezing, rhonchi or rales.  Abdominal:     General: Bowel sounds are normal. There is no distension.     Palpations: Abdomen is soft. There is no mass.     Tenderness: There is no abdominal tenderness. There is no guarding or rebound.     Hernia: A hernia is present.     Comments: Patient has rotund abdomen.  No fluid wave is noted.  A reducible umbilical hernia is present.  Bilateral inguinal hernias are present, right greater than left.  Genitourinary:    Testes: Normal.  Skin:    General: Skin is warm and dry.  Neurological:     Mental Status: He is alert and oriented to person, place, and time.   CT scan report reviewed  Assessment  Bilateral inguinal hernias, right greater than left Umbilical hernia, reducible History of hepatic steatosis.  No evidence of cirrhosis on recent CT scan of abdomen. History of atrial fibrillation, resolved Plan  I told the patient that I could repair his inguinal hernias once he is cleared by cardiology.  He will get cardiology clearance and then call my office to schedule surgery.  I do not think his umbilical hernia needs to be addressed at the present time.

## 2024-03-24 ENCOUNTER — Ambulatory Visit: Attending: Student | Admitting: Student

## 2024-03-24 ENCOUNTER — Encounter: Payer: Self-pay | Admitting: Student

## 2024-03-24 VITALS — BP 136/74 | HR 76 | Ht 70.0 in | Wt 171.0 lb

## 2024-03-24 DIAGNOSIS — I5022 Chronic systolic (congestive) heart failure: Secondary | ICD-10-CM | POA: Diagnosis not present

## 2024-03-24 DIAGNOSIS — I4892 Unspecified atrial flutter: Secondary | ICD-10-CM

## 2024-03-24 DIAGNOSIS — Z01810 Encounter for preprocedural cardiovascular examination: Secondary | ICD-10-CM | POA: Diagnosis not present

## 2024-03-24 DIAGNOSIS — I2510 Atherosclerotic heart disease of native coronary artery without angina pectoris: Secondary | ICD-10-CM | POA: Diagnosis not present

## 2024-03-24 MED ORDER — BISOPROLOL FUMARATE 5 MG PO TABS: 5.0000 mg | ORAL_TABLET | Freq: Every day | ORAL | 3 refills | Status: AC

## 2024-03-24 NOTE — Patient Instructions (Signed)
 Medication Instructions:   Continue current medical therapy. Bisoprolol  has been sent in.   *If you need a refill on your cardiac medications before your next appointment, please call your pharmacy*  Testing:  Echocardiogram - Ultrasound of your heart.  Follow-Up: At [facility], you and your health needs are our priority.  As part of our continuing mission to provide you with exceptional heart care, our providers are all part of one team.  This team includes your primary Cardiologist (physician) and Advanced Practice Providers or APPs (Physician Assistants and Nurse Practitioners) who all work together to provide you with the care you need, when you need it.  Your next appointment:   1 year(s)  Provider:   You may see None or one of the following Advanced Practice Providers on your designated Care Team:   [place], PA-C  [place], [place] Olivia Pavy, [place]     We recommend signing up for the patient portal called MyChart.  Sign up information is provided on this After Visit Summary.  MyChart is used to connect with patients for Virtual Visits (Telemedicine).  Patients are able to view lab/test results, encounter notes, upcoming appointments, etc.  Non-urgent messages can be sent to your provider as well.   To learn more about what you can do with MyChart, go to [email or web address].

## 2024-03-24 NOTE — Progress Notes (Signed)
 Cardiology Office Note    Date:  03/24/2024  ID:  Jordan Kelly, DOB 04-12-1961, MRN [id number] Cardiologist: Evaluated by Dr. Santo and Dr. Barbaraann during a prior admission but previously requested to follow-up in [facility]     History of Present Illness:    Jordan Kelly is a 63 y.o. 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   He was last examined by myself in 03/2022 and denied any recent chest pain or palpitations at that time. Was experiencing back pain and had recently been diagnosed with ankylosing spondylitis. The option of stopping anticoagulation was reviewed given that his CHA2DS2-VASc score was 1 but he wished to remain on it at that time. Was continued on Eliquis  along with Bisoprolol  5 mg daily and was informed to follow-up in 1 year but has not been evaluated by Cardiology since.  He was recently evaluated by Dr. Mavis on 03/23/2024 for follow-up of right groin pain and had been diagnosed with bilateral inguinal hernias. Repair of his hernias was recommended once cleared by cardiology.  In talking with the patient today, his biggest issue has been inguinal pain due to his hernias. He denies any recent chest pain or palpitations. No specific dyspnea on exertion, orthopnea, PND or pitting edema. Activity has been more limited given his hernias but less than a month ago, he was able to walk over a mile at a time without stopping and no symptoms with this. Says he averages over 12,000 steps a day between being active at work (works at [employer]) and doing chores around his home. Push mowed his yard within the past week without any anginal symptoms. Smokes approximately 0.5 ppd. Reports consuming  occasional alcoholic beverages but has reduced his intake.   Studies Reviewed:   EKG: EKG is ordered today and demonstrates:   EKG Interpretation Date/Time:  Wednesday March 24 2024 13:34:49 EDT Ventricular Rate:  80 PR Interval:  142 QRS Duration:  90 QT Interval:  354 QTC Calculation: 408 R Axis:   81  Text Interpretation: Normal sinus rhythm Normal ECG When compared with ECG of 29-Jan-2021 10:33, No significant change was found Confirmed by Johnson Grate ([id number]) on 03/24/2024 1:38:44 PM       Echocardiogram: 10/2020 IMPRESSIONS     1. Left ventricular ejection fraction, by estimation, is 45 to 50%. The  left ventricle has mildly decreased function. The left ventricle  demonstrates global hypokinesis. Left ventricular diastolic function could  not be evaluated. There is the  interventricular septum is flattened in diastole ('D' shaped left  ventricle), consistent with right ventricular volume overload. The average  left ventricular global longitudinal strain is -12.0 %. The global  longitudinal strain is abnormal.   2. Right ventricular systolic function is moderately reduced. The right  ventricular size is moderately enlarged. There is mildly elevated  pulmonary artery systolic pressure.   3. Right atrial size was severely dilated.   4. The mitral valve is normal in structure. No evidence of mitral valve  regurgitation.   5. The aortic valve is tricuspid. Aortic valve regurgitation is not  visualized. No aortic stenosis is present.   6. The inferior vena cava is dilated in size with <50% respiratory  variability, suggesting right atrial pressure  of 15 mmHg.   7. Evidence of atrial level shunting detected by color flow Doppler.  There is a small secundum atrial septal defect with bidirectional shunting  across the atrial septum.   Comparison(s): No prior Echocardiogram.   R/LHC: 01/2021 1.  Minor nonobstructive coronary artery disease with patent coronary  arteries and no high-grade stenoses 2.  Essentially normal right heart pressures with a pulmonary artery pressure of 43/15, mean 25, normal right atrial pressure of 5 mmHg, and normal cardiac output of 5.35 with an index of 2.81   Recommend: Ongoing efforts at medical therapy.  The patient appears to be very well compensated at present.  It is possible that he was decompensated with atrial flutter as the precipitant back a few months ago.  He seems to be clinically much better and his right heart pressures are essentially normal with the exception of a borderline pulmonary pressure.  Cardiac output is preserved and he seems to be doing quite well.  Risk Assessment/Calculations:    CHA2DS2-VASc Score = 1   This indicates a 0.6% annual risk of stroke. The patient's score is based upon: CHF History: 1 HTN History: 0 Diabetes History: 0 Stroke History: 0 Vascular Disease History: 0 Age Score: 0 Gender Score: 0    Physical Exam:   VS:  BP 136/74   Pulse 76   Ht 5' 10 (1.778 m)   Wt 171 lb (77.6 kg)   SpO2 93%   BMI 24.54 kg/m    Wt Readings from Last 3 Encounters:  03/24/24 171 lb (77.6 kg)  03/23/24 173 lb 6.4 oz (78.7 kg)  02/18/24 167 lb (75.8 kg)     GEN: Well nourished, well developed male appearing in no acute distress NECK: No JVD; No carotid bruits CARDIAC: RRR, no murmurs, rubs, gallops RESPIRATORY:  Clear to auscultation without rales, wheezing or rhonchi  ABDOMEN: Appears non-distended. No obvious abdominal masses. EXTREMITIES: No clubbing or cyanosis. No pitting edema.  Distal pedal pulses are 2+ bilaterally.   Assessment and Plan:   1. Atrial flutter, unspecified type (HCC) - Diagnosed during admission in 10/2020 when he underwent TEE/DCCV at that time. He denies any recent palpitations and is maintaining normal sinus rhythm by examination and EKG today. He has been without Bisoprolol  and I did recommend restarting this at 5 mg daily as this had been helping  with his blood pressure as well. - No longer on anticoagulation given his CHA2DS2-VASc or of 1.  2. Chronic heart failure with mildly reduced ejection fraction (HFmrEF, 41-49%) (HCC) - His EF was 45 to 50% in 10/2020 and felt to be tachycardia-mediated. He denies any orthopnea, PND or pitting edema and appears euvolemic by examination today. I did recommend that we obtain a follow-up echocardiogram for reassessment of his EF but this does not need to be obtained prior to surgery. Will restart Bisoprolol  5 mg daily as outlined above.  3. Coronary artery disease involving native coronary artery of native heart without angina pectoris - Prior cardiac catheterization 01/2021 showed only minor, nonobstructive disease with patent coronary arteries.  He denies any recent anginal symptoms. Continue with risk factor modification.  4. Preoperative cardiovascular examination - He is able to perform more than 4 METS of activity without anginal symptoms and RCRI risk is overall low at 1.1% risk of a major cardiac event. He is able to proceed with surgery without the indication for further cardiac testing at this time. As discussed above, cardiac catheterization in 2022 showed minimal plaque  and he denies any recent heart failure symptoms.  Signed, Jordan CHRISTELLA Qua, PA-C

## 2024-03-25 ENCOUNTER — Telehealth: Payer: Self-pay | Admitting: *Deleted

## 2024-03-25 DIAGNOSIS — K4020 Bilateral inguinal hernia, without obstruction or gangrene, not specified as recurrent: Secondary | ICD-10-CM

## 2024-03-25 NOTE — Telephone Encounter (Signed)
 Received cardiac clearance: RCRI risk is overall low at 1.1% risk of a major cardiac event. He is able to proceed with surgery without the indication for further cardiac testing at this time.  Laymon CHRISTELLA Qua, PA-C  03/24/2024  Dr. Mavis made aware and new orders obtained to schedule patient for bilateral inguinal hernia repair.   Call placed to patient and patient made aware.

## 2024-03-29 NOTE — H&P (Signed)
 Jordan Kelly; [id number]; 1961/06/08   HPI Patient is a 63 year old white male who returns back to my care for evaluation and treatment of his right inguinal hernia.  Since I last saw him in 2023, he has undergone cardiac ablation for his atrial fibrillation.  He is off Eliquis  at the present time.  He missed his follow-up appointment with cardiology recently.  He states he is going back to see his cardiologist.  He was recently seen in the emergency room for right groin pain and was told that he has a hernia.  He states the right inguinal hernia seems to have worsened since I last saw him.  He denies any incarceration.  He states he still drinks a couple of beers a day. Past Medical History:  Diagnosis Date   Atrial flutter (HCC)    a. s/p TEE/DCCV in 10/2020   CHF (congestive heart failure) (HCC)    a. EF previously 45-50% in 10/2020 with cath in 01/2021 showing only minor CAD and normal right heart pressures   COPD (chronic obstructive pulmonary disease) (HCC)    Depression    Dysrhythmia    PFO (patent foramen ovale)    Psoriasis     Past Surgical History:  Procedure Laterality Date   BUBBLE STUDY  11/08/2020   Procedure: BUBBLE STUDY;  Surgeon: Jeffrie Oneil BROCKS, MD;  Location: [facility] ENDOSCOPY;  Service: Cardiovascular;;   CARDIOVERSION N/A 11/08/2020   Procedure: CARDIOVERSION;  Surgeon: Jeffrie Oneil BROCKS, MD;  Location: [facility] ENDOSCOPY;  Service: Cardiovascular;  Laterality: N/A;   COLONOSCOPY WITH PROPOFOL  N/A 07/16/2021   Procedure: COLONOSCOPY WITH PROPOFOL ;  Surgeon: Cindie Carlin POUR, DO;  Location: [facility] ENDO SUITE;  Service: Endoscopy;  Laterality: N/A;  10:30am   HERNIA REPAIR     right inguinal hernia   POLYPECTOMY  07/16/2021   Procedure: POLYPECTOMY;  Surgeon: Cindie Carlin POUR, DO;  Location: [facility] ENDO SUITE;  Service: Endoscopy;;   RIGHT/LEFT HEART CATH AND CORONARY ANGIOGRAPHY N/A 01/29/2021   Procedure: RIGHT/LEFT HEART CATH AND CORONARY ANGIOGRAPHY;  Surgeon: Wonda Sharper, MD;   Location: [facility] INVASIVE CV LAB;  Service: Cardiovascular;  Laterality: N/A;   TEE WITHOUT CARDIOVERSION N/A 11/08/2020   Procedure: TRANSESOPHAGEAL ECHOCARDIOGRAM (TEE);  Surgeon: Jeffrie Oneil BROCKS, MD;  Location: [facility] ENDOSCOPY;  Service: Cardiovascular;  Laterality: N/A;    Family History  Problem Relation Age of Onset   COPD Father     Current Outpatient Medications on File Prior to Visit  Medication Sig Dispense Refill   albuterol  (VENTOLIN  HFA) 108 (90 Base) MCG/ACT inhaler Inhale 2 puffs into the lungs every 6 (six) hours as needed for wheezing or shortness of breath. 18 g 0   bisoprolol  (ZEBETA ) 5 MG tablet Take 1 tablet (5 mg total) by mouth daily. 90 tablet 3   Multiple Vitamin (MULTIVITAMIN WITH MINERALS) TABS tablet Take 1 tablet by mouth daily. 30 tablet 3   naproxen  (NAPROSYN ) 375 MG tablet Take 1 tablet (375 mg total) by mouth 2 (two) times daily. 20 tablet 0   tamsulosin  (FLOMAX ) 0.4 MG CAPS capsule Take 1 capsule (0.4 mg total) by mouth daily. 30 capsule 3   No current facility-administered medications on file prior to visit.    No Known Allergies  Social History   Substance and Sexual Activity  Alcohol Use Yes   Comment: 5-6 per week    Social History   Tobacco Use  Smoking Status Every Day   Current packs/day: 0.25   Types: Cigarettes   Passive  exposure: Never  Smokeless Tobacco Never  Tobacco Comments   Smokes 5 cigarettes per day 04/02/21    Review of Systems  Constitutional: Negative.   HENT: Negative.    Eyes: Negative.   Respiratory:  Positive for wheezing.   Cardiovascular: Negative.   Gastrointestinal:  Positive for abdominal pain.  Genitourinary: Negative.   Musculoskeletal:  Positive for back pain.  Skin: Negative.   Neurological: Negative.   Endo/Heme/Allergies: Negative.   Psychiatric/Behavioral: Negative.      Objective   Vitals:   03/23/24 1538 03/23/24 1541  BP: (!) 141/85 (!) 141/84  Pulse: 69   Resp: 18   Temp: 97.7 F (36.5 C)    SpO2: 91%     Physical Exam Vitals reviewed.  Constitutional:      Appearance: Normal appearance. He is normal weight. He is not ill-appearing.  HENT:     Head: Normocephalic and atraumatic.  Cardiovascular:     Rate and Rhythm: Normal rate and regular rhythm.     Heart sounds: Normal heart sounds. No murmur heard.    No friction rub. No gallop.  Pulmonary:     Effort: Pulmonary effort is normal. No respiratory distress.     Breath sounds: Normal breath sounds. No stridor. No wheezing, rhonchi or rales.  Abdominal:     General: Bowel sounds are normal. There is no distension.     Palpations: Abdomen is soft. There is no mass.     Tenderness: There is no abdominal tenderness. There is no guarding or rebound.     Hernia: A hernia is present.     Comments: Patient has rotund abdomen.  No fluid wave is noted.  A reducible umbilical hernia is present.  Bilateral inguinal hernias are present, right greater than left.  Genitourinary:    Testes: Normal.  Skin:    General: Skin is warm and dry.  Neurological:     Mental Status: He is alert and oriented to person, place, and time.   CT scan report reviewed  Assessment  Bilateral inguinal hernias, right greater than left Umbilical hernia, reducible History of hepatic steatosis.  No evidence of cirrhosis on recent CT scan of abdomen. History of atrial fibrillation, resolved Plan  I told the patient that I could repair his inguinal hernias once he is cleared by cardiology.  He will get cardiology clearance and then call my office to schedule surgery.  I do not think his umbilical hernia needs to be addressed at the present time.

## 2024-04-16 ENCOUNTER — Encounter (HOSPITAL_COMMUNITY)
Admission: RE | Admit: 2024-04-16 | Discharge: 2024-04-16 | Disposition: A | Discharge status: Home / Self Care | Source: Ambulatory Visit | Attending: General Surgery | Admitting: General Surgery

## 2024-04-16 ENCOUNTER — Encounter (HOSPITAL_COMMUNITY): Payer: Self-pay

## 2024-04-21 ENCOUNTER — Encounter (HOSPITAL_COMMUNITY): Payer: Self-pay | Admitting: General Surgery

## 2024-04-21 ENCOUNTER — Encounter (HOSPITAL_COMMUNITY)
Admission: RE | Disposition: A | Discharge status: Home / Self Care | Payer: Self-pay | Source: Home / Self Care | Attending: General Surgery

## 2024-04-21 ENCOUNTER — Ambulatory Visit (HOSPITAL_COMMUNITY)
Admission: RE | Admit: 2024-04-21 | Discharge: 2024-04-21 | Disposition: A | Discharge status: Home / Self Care | Attending: General Surgery | Admitting: General Surgery

## 2024-04-21 ENCOUNTER — Ambulatory Visit (HOSPITAL_COMMUNITY)

## 2024-04-21 ENCOUNTER — Other Ambulatory Visit (HOSPITAL_COMMUNITY): Payer: Self-pay

## 2024-04-21 DIAGNOSIS — I509 Heart failure, unspecified: Secondary | ICD-10-CM | POA: Insufficient documentation

## 2024-04-21 DIAGNOSIS — K760 Fatty (change of) liver, not elsewhere classified: Secondary | ICD-10-CM | POA: Insufficient documentation

## 2024-04-21 DIAGNOSIS — F17210 Nicotine dependence, cigarettes, uncomplicated: Secondary | ICD-10-CM | POA: Insufficient documentation

## 2024-04-21 DIAGNOSIS — I2510 Atherosclerotic heart disease of native coronary artery without angina pectoris: Secondary | ICD-10-CM | POA: Insufficient documentation

## 2024-04-21 DIAGNOSIS — K4020 Bilateral inguinal hernia, without obstruction or gangrene, not specified as recurrent: Secondary | ICD-10-CM | POA: Diagnosis present

## 2024-04-21 DIAGNOSIS — J449 Chronic obstructive pulmonary disease, unspecified: Secondary | ICD-10-CM | POA: Diagnosis not present

## 2024-04-21 DIAGNOSIS — I4891 Unspecified atrial fibrillation: Secondary | ICD-10-CM | POA: Diagnosis not present

## 2024-04-21 DIAGNOSIS — K4090 Unilateral inguinal hernia, without obstruction or gangrene, not specified as recurrent: Secondary | ICD-10-CM | POA: Insufficient documentation

## 2024-04-21 HISTORY — PX: REPAIR, HERNIA, INGUINAL, BILATERAL, ROBOT-ASSISTED: SHX7636

## 2024-04-21 LAB — POCT I-STAT, CHEM 8
BUN: 12 mg/dL (ref 8–23)
Calcium, Ion: 1.13 mmol/L — ABNORMAL LOW (ref 1.15–1.40)
Chloride: 101 mmol/L (ref 98–111)
Creatinine, Ser: 0.6 mg/dL — ABNORMAL LOW (ref 0.61–1.24)
Glucose, Bld: 97 mg/dL (ref 70–99)
HCT: 48 % (ref 39.0–52.0)
Hemoglobin: 16.3 g/dL (ref 13.0–17.0)
Potassium: 4.3 mmol/L (ref 3.5–5.1)
Sodium: 138 mmol/L (ref 135–145)
TCO2: 25 mmol/L (ref 22–32)

## 2024-04-21 SURGERY — REPAIR, HERNIA, INGUINAL, BILATERAL, ROBOT-ASSISTED
Anesthesia: General | Site: Abdomen | Laterality: Right

## 2024-04-21 MED ORDER — CEFAZOLIN SODIUM-DEXTROSE 2-4 GM/100ML-% IV SOLN
INTRAVENOUS | Status: AC
  Filled 2024-04-21: qty 100

## 2024-04-21 MED ORDER — DEXAMETHASONE SODIUM PHOSPHATE 10 MG/ML IJ SOLN
INTRAMUSCULAR | Status: AC
  Filled 2024-04-21: qty 1

## 2024-04-21 MED ORDER — PROPOFOL 10 MG/ML IV BOLUS
INTRAVENOUS | Status: DC | PRN
  Administered 2024-04-21: 200 mg via INTRAVENOUS

## 2024-04-21 MED ORDER — ONDANSETRON HCL 4 MG/2ML IJ SOLN
INTRAMUSCULAR | Status: AC
  Filled 2024-04-21: qty 2

## 2024-04-21 MED ORDER — SUGAMMADEX SODIUM 200 MG/2ML IV SOLN
INTRAVENOUS | Status: AC
Start: 2024-04-21 — End: 2024-04-21
  Filled 2024-04-21: qty 2

## 2024-04-21 MED ORDER — FENTANYL CITRATE PF 50 MCG/ML IJ SOSY
25.0000 ug | PREFILLED_SYRINGE | INTRAMUSCULAR | Status: DC | PRN
  Administered 2024-04-21: 50 ug via INTRAVENOUS
  Filled 2024-04-21: qty 1

## 2024-04-21 MED ORDER — IPRATROPIUM-ALBUTEROL 0.5-2.5 (3) MG/3ML IN SOLN
RESPIRATORY_TRACT | Status: AC
  Administered 2024-04-21: 3 mL via RESPIRATORY_TRACT
  Filled 2024-04-21: qty 3

## 2024-04-21 MED ORDER — CHLORHEXIDINE GLUCONATE 0.12 % MT SOLN
15.0000 mL | Freq: Once | OROMUCOSAL | Status: AC
  Administered 2024-04-21: 15 mL via OROMUCOSAL

## 2024-04-21 MED ORDER — OXYCODONE HCL 5 MG PO TABS: 5.0000 mg | ORAL_TABLET | Freq: Four times a day (QID) | ORAL | 0 refills | Status: DC | PRN

## 2024-04-21 MED ORDER — LACTATED RINGERS IV SOLN
INTRAVENOUS | Status: DC
Start: 2024-04-21 — End: 2024-04-21

## 2024-04-21 MED ORDER — MIDAZOLAM HCL 2 MG/2ML IJ SOLN
INTRAMUSCULAR | Status: DC | PRN
  Administered 2024-04-21: 2 mg via INTRAVENOUS

## 2024-04-21 MED ORDER — ONDANSETRON HCL 4 MG/2ML IJ SOLN
INTRAMUSCULAR | Status: DC | PRN
Start: 2024-04-21 — End: 2024-04-21
  Administered 2024-04-21: 4 mg via INTRAVENOUS

## 2024-04-21 MED ORDER — GLYCOPYRROLATE PF 0.2 MG/ML IJ SOSY
PREFILLED_SYRINGE | INTRAMUSCULAR | Status: AC
  Filled 2024-04-21: qty 1

## 2024-04-21 MED ORDER — SUGAMMADEX SODIUM 200 MG/2ML IV SOLN
INTRAVENOUS | Status: DC | PRN
  Administered 2024-04-21: 200 mg via INTRAVENOUS

## 2024-04-21 MED ORDER — FENTANYL CITRATE (PF) 100 MCG/2ML IJ SOLN
INTRAMUSCULAR | Status: DC | PRN
  Administered 2024-04-21 (×5): 50 ug via INTRAVENOUS

## 2024-04-21 MED ORDER — CEFAZOLIN SODIUM-DEXTROSE 2-4 GM/100ML-% IV SOLN
2.0000 g | INTRAVENOUS | Status: AC
  Administered 2024-04-21: 2 g via INTRAVENOUS

## 2024-04-21 MED ORDER — DEXAMETHASONE SODIUM PHOSPHATE 10 MG/ML IJ SOLN
INTRAMUSCULAR | Status: DC | PRN
  Administered 2024-04-21: 4 mg via INTRAVENOUS

## 2024-04-21 MED ORDER — CHLORHEXIDINE GLUCONATE CLOTH 2 % EX PADS: 6.0000 | MEDICATED_PAD | Freq: Once | CUTANEOUS | Status: DC

## 2024-04-21 MED ORDER — KETOROLAC TROMETHAMINE 30 MG/ML IJ SOLN
30.0000 mg | Freq: Once | INTRAMUSCULAR | Status: AC
  Administered 2024-04-21: 30 mg via INTRAVENOUS
  Filled 2024-04-21: qty 1

## 2024-04-21 MED ORDER — ROCURONIUM BROMIDE 10 MG/ML (PF) SYRINGE
PREFILLED_SYRINGE | INTRAVENOUS | Status: DC | PRN
  Administered 2024-04-21: 30 mg via INTRAVENOUS
  Administered 2024-04-21: 70 mg via INTRAVENOUS

## 2024-04-21 MED ORDER — BUPIVACAINE HCL (PF) 0.5 % IJ SOLN
INTRAMUSCULAR | Status: DC | PRN
  Administered 2024-04-21: 30 mL

## 2024-04-21 MED ORDER — LIDOCAINE 2% (20 MG/ML) 5 ML SYRINGE
INTRAMUSCULAR | Status: AC
  Filled 2024-04-21: qty 5

## 2024-04-21 MED ORDER — OXYCODONE HCL 5 MG PO TABS
5.0000 mg | ORAL_TABLET | Freq: Once | ORAL | Status: AC | PRN
  Administered 2024-04-21: 5 mg via ORAL
  Filled 2024-04-21: qty 1

## 2024-04-21 MED ORDER — STERILE WATER FOR IRRIGATION IR SOLN
Status: DC | PRN
  Administered 2024-04-21: 500 mL

## 2024-04-21 MED ORDER — ROCURONIUM BROMIDE 10 MG/ML (PF) SYRINGE
PREFILLED_SYRINGE | INTRAVENOUS | Status: AC
  Filled 2024-04-21: qty 10

## 2024-04-21 MED ORDER — PROPOFOL 10 MG/ML IV BOLUS
INTRAVENOUS | Status: AC
  Filled 2024-04-21: qty 20

## 2024-04-21 MED ORDER — IPRATROPIUM-ALBUTEROL 0.5-2.5 (3) MG/3ML IN SOLN: 3.0000 mL | Freq: Once | RESPIRATORY_TRACT | Status: AC

## 2024-04-21 MED ORDER — MIDAZOLAM HCL 2 MG/2ML IJ SOLN
INTRAMUSCULAR | Status: AC
  Filled 2024-04-21: qty 2

## 2024-04-21 MED ORDER — PHENYLEPHRINE HCL (PRESSORS) 10 MG/ML IV SOLN
INTRAVENOUS | Status: DC | PRN
  Administered 2024-04-21 (×2): 100 ug via INTRAVENOUS

## 2024-04-21 MED ORDER — FENTANYL CITRATE (PF) 250 MCG/5ML IJ SOLN
INTRAMUSCULAR | Status: AC
  Filled 2024-04-21: qty 5

## 2024-04-21 MED ORDER — PHENYLEPHRINE 80 MCG/ML (10ML) SYRINGE FOR IV PUSH (FOR BLOOD PRESSURE SUPPORT)
PREFILLED_SYRINGE | INTRAVENOUS | Status: AC
  Filled 2024-04-21: qty 10

## 2024-04-21 MED ORDER — OXYCODONE HCL 5 MG/5ML PO SOLN: 5.0000 mg | Freq: Once | ORAL | Status: AC | PRN

## 2024-04-21 MED ORDER — ORAL CARE MOUTH RINSE: 15.0000 mL | Freq: Once | OROMUCOSAL | Status: AC

## 2024-04-21 MED ORDER — LIDOCAINE HCL (CARDIAC) PF 100 MG/5ML IV SOSY
PREFILLED_SYRINGE | INTRAVENOUS | Status: DC | PRN
  Administered 2024-04-21: 80 mg via INTRATRACHEAL

## 2024-04-21 MED ORDER — BUPIVACAINE HCL (PF) 0.5 % IJ SOLN
INTRAMUSCULAR | Status: AC
  Filled 2024-04-21: qty 30

## 2024-04-21 SURGICAL SUPPLY — 39 items
CHLORAPREP W/TINT 26 (MISCELLANEOUS) ×1 IMPLANT
COVER LIGHT HANDLE STERIS (MISCELLANEOUS) ×1 IMPLANT
COVER MAYO STAND XLG (MISCELLANEOUS) ×1 IMPLANT
COVER TIP SHEARS 8 DVNC (MISCELLANEOUS) ×1 IMPLANT
DERMABOND ADVANCED .7 DNX12 (GAUZE/BANDAGES/DRESSINGS) ×1 IMPLANT
DRAPE ARM DVNC X/XI (DISPOSABLE) ×3 IMPLANT
DRAPE COLUMN DVNC XI (DISPOSABLE) ×1 IMPLANT
DRIVER NDL MEGA SUTCUT DVNCXI (INSTRUMENTS) ×1 IMPLANT
DRIVER NDLE MEGA SUTCUT DVNCXI (INSTRUMENTS) ×1 IMPLANT
ELECTRODE REM PT RTRN 9FT ADLT (ELECTROSURGICAL) ×1 IMPLANT
FORCEPS BPLR R/ABLATION 8 DVNC (INSTRUMENTS) ×1 IMPLANT
GAUZE SPONGE 4X4 12PLY STRL (GAUZE/BANDAGES/DRESSINGS) ×1 IMPLANT
GLOVE BIO SURGEON STRL SZ7 (GLOVE) IMPLANT
GLOVE BIOGEL PI IND STRL 7.0 (GLOVE) ×4 IMPLANT
GLOVE SURG SS PI 7.5 STRL IVOR (GLOVE) ×2 IMPLANT
GOWN STRL REUS W/TWL LRG LVL3 (GOWN DISPOSABLE) ×2 IMPLANT
KIT PINK PAD W/HEAD ARM REST (MISCELLANEOUS) ×1 IMPLANT
KIT TURNOVER KIT A (KITS) ×1 IMPLANT
MESH 3DMAX MID 5X7 RT XLRG (Mesh General) IMPLANT
NDL HYPO 21X1.5 SAFETY (NEEDLE) ×1 IMPLANT
NDL INSUFFLATION 14GA 120MM (NEEDLE) ×1 IMPLANT
NEEDLE HYPO 21X1.5 SAFETY (NEEDLE) ×1 IMPLANT
NEEDLE INSUFFLATION 14GA 120MM (NEEDLE) ×1 IMPLANT
OBTURATOR OPTICALSTD 8 DVNC (TROCAR) ×1 IMPLANT
PACK LAP CHOLE LZT030E (CUSTOM PROCEDURE TRAY) ×1 IMPLANT
PENCIL HANDSWITCHING (ELECTRODE) ×1 IMPLANT
SCISSORS MNPLR CVD DVNC XI (INSTRUMENTS) ×1 IMPLANT
SEAL UNIV 5-12 XI (MISCELLANEOUS) ×3 IMPLANT
SET BASIN LINEN APH (SET/KITS/TRAYS/PACK) ×1 IMPLANT
SET TUBE DA VINCI INSUFFLATOR (TUBING) IMPLANT
SOL PREP POV-IOD 4OZ 10% (MISCELLANEOUS) ×1 IMPLANT
SUT MNCRL AB 4-0 PS2 18 (SUTURE) ×2 IMPLANT
SUT STRATA 3-0 SH (SUTURE) ×4 IMPLANT
SUT VIC AB 2-0 SH 27X BRD (SUTURE) ×1 IMPLANT
SYR 30ML LL (SYRINGE) ×1 IMPLANT
TAPE TRANSPORE STRL 2 31045 (GAUZE/BANDAGES/DRESSINGS) ×1 IMPLANT
TRAY FOL W/BAG SLVR 16FR STRL (SET/KITS/TRAYS/PACK) ×1 IMPLANT
TUBE CONNECTING 12X1/4 (SUCTIONS) IMPLANT
WATER STERILE IRR 500ML POUR (IV SOLUTION) ×1 IMPLANT

## 2024-04-21 NOTE — Transfer of Care (Signed)
 Immediate Anesthesia Transfer of Care Note  Patient: Jordan Kelly  Procedure(s) Performed: REPAIR, HERNIA, INGUINAL, ROBOT-ASSISTED (Right: Abdomen)  Patient Location: PACU  Anesthesia Type:General  Level of Consciousness: awake, alert , oriented, and patient cooperative  Airway & Oxygen  Therapy: Patient Spontanous Breathing and Patient connected to nasal cannula oxygen   Post-op Assessment: Report given to RN and Post -op Vital signs reviewed and stable  Post vital signs: Reviewed and stable  Last Vitals:  Vitals Value Taken Time  BP 113/71 04/21/24 09:28  Temp 36.6 C 04/21/24 09:28  Pulse 66 04/21/24 09:28  Resp 18 04/21/24 09:28  SpO2 100 % 04/21/24 09:28  Vitals shown include unfiled device data.  Last Pain:  Vitals:   04/21/24 0706  TempSrc: Oral  PainSc: 4       Patients Stated Pain Goal: 5 (04/21/24 0706)  Complications: No notable events documented.

## 2024-04-21 NOTE — Anesthesia Procedure Notes (Signed)
 Procedure Name: Intubation Date/Time: 04/21/2024 7:39 AM  Performed by: Pheobe Adine CROME, CRNAPre-anesthesia Checklist: Patient identified, Emergency Drugs available, Suction available, Patient being monitored and Timeout performed Patient Re-evaluated:Patient Re-evaluated prior to induction Oxygen  Delivery Method: Circle system utilized Preoxygenation: Pre-oxygenation with 100% oxygen  Induction Type: IV induction Ventilation: Mask ventilation without difficulty Laryngoscope Size: Miller and 2 Grade View: Grade I Tube type: Oral Tube size: 7.5 mm Number of attempts: 1 Airway Equipment and Method: Stylet Placement Confirmation: ETT inserted through vocal cords under direct vision, positive ETCO2, CO2 detector and breath sounds checked- equal and bilateral Secured at: 23 cm Tube secured with: Tape Dental Injury: Teeth and Oropharynx as per pre-operative assessment

## 2024-04-21 NOTE — Anesthesia Postprocedure Evaluation (Signed)
 Anesthesia Post Note  Patient: Jordan Kelly  Procedure(s) Performed: REPAIR, HERNIA, INGUINAL, ROBOT-ASSISTED (Right: Abdomen)  Patient location during evaluation: PACU Anesthesia Type: General Level of consciousness: awake and alert Pain management: pain level controlled Vital Signs Assessment: post-procedure vital signs reviewed and stable Respiratory status: spontaneous breathing, nonlabored ventilation, respiratory function stable and patient connected to nasal cannula oxygen  Cardiovascular status: blood pressure returned to baseline and stable Postop Assessment: no apparent nausea or vomiting Anesthetic complications: no   No notable events documented.   Last Vitals:  Vitals:   04/21/24 0926 04/21/24 0928  BP:  113/71  Pulse:  61  Resp:  12  Temp: 36.6 C 36.6 C  SpO2:  100%    Last Pain:  Vitals:   04/21/24 0706  TempSrc: Oral  PainSc: 4                  Andrea Limes

## 2024-04-21 NOTE — Anesthesia Preprocedure Evaluation (Addendum)
 Anesthesia Evaluation  Patient identified by MRN, date of birth, ID band Patient awake    Reviewed: Allergy & Precautions, H&P , NPO status , Patient's Chart, lab work & pertinent test results  Airway Mallampati: II  TM Distance: >3 FB Neck ROM: Full    Dental no notable dental hx.    Pulmonary COPD, Current Smoker and Patient abstained from smoking.   + rhonchi  + wheezing      Cardiovascular + CAD and +CHF  Normal cardiovascular exam+ dysrhythmias Atrial Fibrillation  Rhythm:Regular Rate:Normal  Ef 45-50   Neuro/Psych  PSYCHIATRIC DISORDERS  Depression    negative neurological ROS     GI/Hepatic negative GI ROS,,,(+) Cirrhosis     substance abuse  alcohol use  Endo/Other  negative endocrine ROS    Renal/GU Renal disease  negative genitourinary   Musculoskeletal  (+) Arthritis ,    Abdominal   Peds negative pediatric ROS (+)  Hematology negative hematology ROS (+)   Anesthesia Other Findings Duo neb ordered pre op  Reproductive/Obstetrics negative OB ROS                              Anesthesia Physical Anesthesia Plan  ASA: 3  Anesthesia Plan: General   Post-op Pain Management:    Induction: Intravenous  PONV Risk Score and Plan:   Airway Management Planned: Oral ETT  Additional Equipment:   Intra-op Plan:   Post-operative Plan: Extubation in OR  Informed Consent: I have reviewed the patients History and Physical, chart, labs and discussed the procedure including the risks, benefits and alternatives for the proposed anesthesia with the patient or authorized representative who has indicated his/her understanding and acceptance.     Dental advisory given  Plan Discussed with: CRNA  Anesthesia Plan Comments: (Check chem 8 )         Anesthesia Quick Evaluation

## 2024-04-21 NOTE — Op Note (Signed)
 Patient:  Jordan Kelly  DOB:  30-Mar-1961  MRN:  [id number]   Preop Diagnosis: Right inguinal hernia, possible left inguinal hernia  Postop Diagnosis: Same  Procedure: Robotic assisted laparoscopic right inguinal herniorrhaphy with mesh  Surgeon: Oneil Budge, MD  Anes: General Endotracheal  Indications: Patient is a 63 year old white male who presents with a symptomatic right inguinal hernia and a possible left inguinal hernia.  Risks and benefits of the procedure including bleeding, infection, mesh use, and the possibility of recurrence of the hernia were fully explained to the patient, who gave informed consent.  Procedure note: The patient was placed in the supine position.  After induction of general endotracheal anesthesia, the abdomen was prepped and draped using usual sterile technique with ChloraPrep and Betadine.  Surgical site confirmation was performed.  An incision was made in the left upper quadrant at Palmer's point.  A Veress needle was introduced into the abdominal cavity and confirmation of placement was done using the saline drop test.  The abdomen was then insufflated to 15 mmHg pressure.  An 8 mm trocar was introduced into the abdominal cavity under direct visualization without difficulty.  Additional 8 mm trocars were placed in the upper midline and right upper quadrant regions.  The patient was then placed in Trendelenburg position.  The robot was then docked and targeted.  On examination of the inguinal region, the patient did have an indirect right inguinal hernia.  On the left side, there was evidence of mesh in place but no significant hernia was identified.  It was elected to only proceed with the right inguinal hernia repair.  A peritoneal flap was then formed lateral to medial.  This was taken down to 3 cm posterior to Cooper's ligament.  This was carried out laterally.  The indirect hernia sac was then freed away from the spermatic cord.  A greater than 7 cm posterior  flap was formed from the internal ring.  An extra-large Bard 3D max mesh was then inserted and secured to Cooper's ligament using a 2-0 Vicryl interrupted suture.  Another fixation suture was placed superiorly to this to the anterior abdominal wall using a 2-0 Vicryl suture.  The peritoneal flap was then closed using a 3-0 Stratafix running suture.  There was a hole in the peritoneal flap and this was closed using a 3-0 Stratafix running suture.  The robot was then undocked and all air was evacuated from the abdominal cavity prior to the removal of the trocars.  All wounds were irrigated with normal saline.  All wounds were injected with 0.5% Sensorcaine .  All incisions were closed using a 4-0 Monocryl subcuticular suture.  Dermabond was applied.  All tape and needle counts were correct at the end of the procedure.  The patient was extubated in the operating room and transferred to PACU in stable condition.  Complications: None  EBL: Minimal  Specimen: None

## 2024-04-21 NOTE — Interval H&P Note (Signed)
 History and Physical Interval Note:  04/21/2024 7:11 AM  Jordan Kelly  has presented today for surgery, with the diagnosis of HERNIA, INGUINAL BILATERAL.  The various methods of treatment have been discussed with the patient and family. After consideration of risks, benefits and other options for treatment, the patient has consented to  Procedure(s) with comments: REPAIR, HERNIA, INGUINAL, BILATERAL, ROBOT-ASSISTED (Bilateral) - W/ MESH as a surgical intervention.  The patient's history has been reviewed, patient examined, no change in status, stable for surgery.  I have reviewed the patient's chart and labs.  Questions were answered to the patient's satisfaction.     Oneil Budge

## 2024-04-22 ENCOUNTER — Encounter (HOSPITAL_COMMUNITY): Payer: Self-pay | Admitting: General Surgery

## 2024-04-28 ENCOUNTER — Ambulatory Visit (HOSPITAL_COMMUNITY): Admission: RE | Admit: 2024-04-28 | Source: Ambulatory Visit

## 2024-04-29 ENCOUNTER — Encounter: Payer: Self-pay | Admitting: *Deleted

## 2024-04-29 ENCOUNTER — Ambulatory Visit (INDEPENDENT_AMBULATORY_CARE_PROVIDER_SITE_OTHER): Admitting: General Surgery

## 2024-04-29 ENCOUNTER — Encounter: Payer: Self-pay | Admitting: General Surgery

## 2024-04-29 VITALS — BP 145/88 | HR 58 | Temp 98.0°F | Resp 16 | Ht 70.0 in | Wt 174.0 lb

## 2024-04-29 DIAGNOSIS — Z09 Encounter for follow-up examination after completed treatment for conditions other than malignant neoplasm: Secondary | ICD-10-CM

## 2024-04-29 NOTE — Progress Notes (Signed)
 Subjective:     Jordan Kelly  Patient here for postoperative visit, status post robotic assisted laparoscopic right inguinal herniorrhaphy with mesh.  Patient is having mild right lower quadrant abdominal discomfort.  He is not taking too many of the pain pills as he wants to avoid becoming addicted.  He has 10 out of the 15 Roxicodone  left. Objective:    BP (!) 145/88   Pulse (!) 58   Temp 98 F (36.7 C) (Oral)   Resp 16   Ht 5' 10 (1.778 m)   Wt 174 lb (78.9 kg)   SpO2 94%   BMI 24.97 kg/m   General:  alert, cooperative, and no distress  Abdomen is soft, incisions healing well.  No significant tenderness noted in the right lower quadrant.  Right groin region healing well without hematoma.     Assessment:    Doing well postoperatively.    Plan:   I told him his abdominal wall pain should resolve with time.  He may use ibuprofen as needed for pain.  May return to work with a less than 20 pound lifting restriction for 2 weeks on 05/03/2024.  May then do full duty after the 2 weeks.  Follow-up here as needed.

## 2024-05-05 ENCOUNTER — Ambulatory Visit: Admitting: Student

## 2024-05-26 ENCOUNTER — Ambulatory Visit

## 2024-05-26 VITALS — BP 158/83 | HR 60 | Ht 70.0 in | Wt 179.0 lb

## 2024-05-26 DIAGNOSIS — M456 Ankylosing spondylitis lumbar region: Secondary | ICD-10-CM

## 2024-05-26 DIAGNOSIS — Z23 Encounter for immunization: Secondary | ICD-10-CM

## 2024-05-26 DIAGNOSIS — F419 Anxiety disorder, unspecified: Secondary | ICD-10-CM | POA: Diagnosis not present

## 2024-05-26 DIAGNOSIS — F322 Major depressive disorder, single episode, severe without psychotic features: Secondary | ICD-10-CM | POA: Diagnosis not present

## 2024-05-26 MED ORDER — SERTRALINE HCL 25 MG PO TABS: 25.0000 mg | ORAL_TABLET | Freq: Every day | ORAL | 3 refills | Status: AC

## 2024-05-26 MED ORDER — HYDROXYZINE PAMOATE 25 MG PO CAPS: 25.0000 mg | ORAL_CAPSULE | Freq: Three times a day (TID) | ORAL | 2 refills | Status: DC | PRN

## 2024-05-26 NOTE — Progress Notes (Signed)
 Established Patient Office Visit  Subjective   Patient ID: Jordan Kelly, male    DOB: 09-30-1960  Age: 63 y.o. MRN: [id number]  Chief Complaint  Patient presents with   Medical Management of Chronic Issues    Follow up    HPI Discussed the use of AI scribe software for clinical note transcription with the patient, who gave verbal consent to proceed.  History of Present Illness   Jordan Kelly is a 63 year old male who presents with anxiety and concerns about elevated blood pressure.  Anxiety and insomnia - Increased anxiety, described as unprecedented - Difficulty sleeping; able to fall asleep but wakes after four hours and cannot return to sleep due to persistent thoughts - Feels overwhelmed, especially in the mornings before work - No panic attacks - Anxiety attributed to recent life stressors, including recent hernia surgery, family issues, financial concerns after job loss, and starting a new job - Significant life changes over the past ten years, including loss of parents, sister, and brother, separation from wife after 22 years, and daughter moving to [place]   Hypertension - Concern about elevated blood pressure, noted to be high during the visit - Currently taking antihypertensive medication but did not take it on the morning of the visit  Postoperative wound complications - Recent hernia surgery performed by Dr. Mavis - Concern about a spot on the surgical site that is red and oozing  Weight gain and decreased physical activity - Monitoring weight, currently at the heaviest he has ever been - Weight gain attributed to lack of exercise following hernia surgery - Previously walked three miles a day but has been unable to do so recently      Patient Active Problem List   Diagnosis Date Noted   Anxiety 05/30/2024   Elevated blood pressure reading 10/30/2022   Encounter for well adult exam with abnormal findings 10/30/2022   Need for influenza vaccination  05/22/2022   Ankylosing spondylitis lumbar region (HCC) 03/27/2022   Right inguinal hernia 03/27/2022   Bilateral flank pain 02/27/2022   Prediabetes 01/25/2022   Chronic bilateral low back pain without sciatica 01/25/2022   BPH associated with nocturia 01/25/2022   Sore throat 09/03/2021   Acute recurrent sinusitis 09/03/2021   Depression, major, single episode, severe (HCC) 09/03/2021   Refused influenza vaccine 09/03/2021   CAD (coronary artery disease) 06/11/2021   Left ear impacted cerumen 03/20/2021   Tinea cruris 03/13/2021   Atrial flutter (HCC) 03/13/2021   Abnormal liver diagnostic imaging 12/12/2020   Pulmonary hypertension, primary (HCC) 11/28/2020   PFO (patent foramen ovale) 11/14/2020   Cirrhosis (HCC) 11/14/2020   Psoriasis 11/14/2020   AKI (acute kidney injury) 11/14/2020   Congestive heart failure (CHF) (HCC) 11/04/2020   Encounter to establish care 10/30/2020   Emphysema/COPD 10/30/2020   Constipation 10/30/2020   Nausea 10/30/2020   Tobacco abuse 01/09/2019   Alcohol abuse 01/09/2019    ROS    Objective:     BP (!) 158/83   Pulse 60   Ht 5' 10 (1.778 m)   Wt 179 lb 0.6 oz (81.2 kg)   SpO2 94%   BMI 25.69 kg/m  BP Readings from Last 3 Encounters:  05/26/24 (!) 158/83  04/29/24 (!) 145/88  04/21/24 (!) 142/88   Wt Readings from Last 3 Encounters:  05/26/24 179 lb 0.6 oz (81.2 kg)  04/29/24 174 lb (78.9 kg)  03/24/24 171 lb (77.6 kg)     Physical Exam Vitals and nursing  note reviewed.  Constitutional:      Appearance: Normal appearance.  HENT:     Head: Normocephalic.  Eyes:     Extraocular Movements: Extraocular movements intact.     Pupils: Pupils are equal, round, and reactive to light.  Cardiovascular:     Rate and Rhythm: Normal rate and regular rhythm.  Pulmonary:     Effort: Pulmonary effort is normal.     Breath sounds: Normal breath sounds.  Musculoskeletal:     Cervical back: Normal range of motion and neck supple.   Neurological:     Mental Status: He is alert and oriented to person, place, and time.  Psychiatric:        Mood and Affect: Mood normal.        Thought Content: Thought content normal.     No results found for any visits on 05/26/24.    The 10-year ASCVD risk score (Arnett DK, et al., 2019) is: 13.6%    Assessment & Plan:   Problem List Items Addressed This Visit       Other   Depression, major, single episode, severe (HCC)   Anxiety with associated sleep disturbance and panic symptoms. He has not previously been treated for anxiety and is seeking medication to help manage these symptoms. - Prescribe sertraline once daily, morning or night. - Prescribe hydroxyzine as needed for anxiety, particularly at bedtime. - Consider referral to a therapist or counselor for additional support.      Relevant Medications   sertraline (ZOLOFT) 25 MG tablet   hydrOXYzine (VISTARIL) 25 MG capsule   Anxiety   Anxiety with associated sleep disturbance and panic symptoms. He has not previously been treated for anxiety and is seeking medication to help manage these symptoms. - Prescribe sertraline once daily, morning or night. - Prescribe hydroxyzine as needed for anxiety, particularly at bedtime. - Consider referral to a therapist or counselor for additional support.      Relevant Medications   sertraline (ZOLOFT) 25 MG tablet   hydrOXYzine (VISTARIL) 25 MG capsule   Other Visit Diagnoses       Immunization due    -  Primary   Relevant Orders   Flu vaccine HIGH DOSE PF(Fluzone Trivalent) (Completed)      Return in about 6 weeks (around 07/07/2024) for chronic follow-up with PCP.    Leita Longs, FNP

## 2024-05-30 DIAGNOSIS — F419 Anxiety disorder, unspecified: Secondary | ICD-10-CM | POA: Insufficient documentation

## 2024-05-30 NOTE — Assessment & Plan Note (Signed)
 Anxiety with associated sleep disturbance and panic symptoms. He has not previously been treated for anxiety and is seeking medication to help manage these symptoms. - Prescribe sertraline once daily, morning or night. - Prescribe hydroxyzine as needed for anxiety, particularly at bedtime. - Consider referral to a therapist or counselor for additional support.

## 2024-06-02 ENCOUNTER — Other Ambulatory Visit (HOSPITAL_COMMUNITY): Payer: Self-pay

## 2024-06-02 ENCOUNTER — Telehealth: Payer: Self-pay | Admitting: Pharmacy Technician

## 2024-06-02 ENCOUNTER — Telehealth: Payer: Self-pay

## 2024-06-02 ENCOUNTER — Other Ambulatory Visit: Payer: Self-pay

## 2024-06-02 MED ORDER — BACITRACIN 500 UNIT/GM EX OINT: 1.0000 | TOPICAL_OINTMENT | Freq: Two times a day (BID) | CUTANEOUS | 0 refills | Status: AC

## 2024-06-02 NOTE — Telephone Encounter (Signed)
 Copied from CRM #[id number]. Topic: Clinical - Medication Question >> Jun 02, 2024  1:34 PM Charlet HERO wrote: Reason for CRM: Patient is calling to see if the Dr can send in a script for antibiotic ointment to help heal up his wound she gave him a 2 day supply but it is still not healed. Please call back on the decision.

## 2024-06-02 NOTE — Telephone Encounter (Signed)
 Antibiotic ointment sent to [facility]

## 2024-06-02 NOTE — Telephone Encounter (Signed)
 Patient advised.

## 2024-06-02 NOTE — Telephone Encounter (Signed)
 Pharmacy Patient Advocate Encounter   Received notification from CoverMyMeds that prior authorization for Bacitracin 500UNIT/GM ointment is required/requested.   Insurance verification completed.   The patient is insured through [employer] (COMMERCIAL).   Per test claim: OTC not covered

## 2024-06-15 ENCOUNTER — Encounter (INDEPENDENT_AMBULATORY_CARE_PROVIDER_SITE_OTHER): Payer: Self-pay | Admitting: *Deleted

## 2024-07-07 ENCOUNTER — Ambulatory Visit

## 2024-07-07 DIAGNOSIS — F419 Anxiety disorder, unspecified: Secondary | ICD-10-CM

## 2024-07-07 MED ORDER — HYDROXYZINE PAMOATE 25 MG PO CAPS: 25.0000 mg | ORAL_CAPSULE | Freq: Three times a day (TID) | ORAL | 5 refills | Status: AC | PRN

## 2024-07-07 NOTE — Progress Notes (Signed)
 Established Patient Office Visit  Subjective   Patient ID: Jordan Kelly, male    DOB: 1960-10-30  Age: 63 y.o. MRN: [id number]  Chief Complaint  Patient presents with   Anxiety    6 week follow up     HPI Discussed the use of AI scribe software for clinical note transcription with the patient, who gave verbal consent to proceed.  History of Present Illness    Jordan Kelly is a 63 year old male who presents for a follow-up visit regarding medication management and post-operative care.  Postoperative wound healing - Status post recent hernia repair surgery. - Initially kept surgical site bandaged, resulting in sweating and irritation. - After removing bandages and applying ointment, the site developed a scab and is healing well.  Respiratory symptoms and infection risk - History of respiratory problems, including issues last winter. - Concerned about respiratory health due to exposure to young grandchildren and working in [employer] [place]. - Considering RSV vaccination for additional protection.  Sleep disturbance and mood - Improvement in sleep quality. - Calmer demeanor since starting sertraline  at the lowest dose and 'cream capsules' taken three times daily as needed. - Requests refill of 'cream capsules'.  Hypertension - Blood pressure is significantly improved compared to previous visit.     Patient Active Problem List   Diagnosis Date Noted   Anxiety 05/30/2024   Elevated blood pressure reading 10/30/2022   Encounter for well adult exam with abnormal findings 10/30/2022   Need for influenza vaccination 05/22/2022   Ankylosing spondylitis lumbar region [facility]) 03/27/2022   Right inguinal hernia 03/27/2022   Bilateral flank pain 02/27/2022   Prediabetes 01/25/2022   Chronic bilateral low back pain without sciatica 01/25/2022   BPH associated with nocturia 01/25/2022   Sore throat 09/03/2021   Acute recurrent sinusitis 09/03/2021   Depression, major, single  episode, severe (HCC) 09/03/2021   Refused influenza vaccine 09/03/2021   CAD (coronary artery disease) 06/11/2021   Left ear impacted cerumen 03/20/2021   Tinea cruris 03/13/2021   Atrial flutter (HCC) 03/13/2021   Abnormal liver diagnostic imaging 12/12/2020   Pulmonary hypertension, primary (HCC) 11/28/2020   PFO (patent foramen ovale) 11/14/2020   Cirrhosis (HCC) 11/14/2020   Psoriasis 11/14/2020   AKI (acute kidney injury) 11/14/2020   Congestive heart failure (CHF) (HCC) 11/04/2020   Encounter to establish care 10/30/2020   Emphysema/COPD 10/30/2020   Constipation 10/30/2020   Nausea 10/30/2020   Tobacco abuse 01/09/2019   Alcohol abuse 01/09/2019    ROS    Objective:     BP 133/83   Pulse 68   Resp 16   Ht 5' 10 (1.778 m)   Wt 177 lb 0.6 oz (80.3 kg)   SpO2 94%   BMI 25.40 kg/m  BP Readings from Last 3 Encounters:  07/07/24 133/83  05/26/24 (!) 158/83  04/29/24 (!) 145/88   Wt Readings from Last 3 Encounters:  07/07/24 177 lb 0.6 oz (80.3 kg)  05/26/24 179 lb 0.6 oz (81.2 kg)  04/29/24 174 lb (78.9 kg)     Physical Exam Vitals and nursing note reviewed.  Constitutional:      Appearance: Normal appearance.  HENT:     Head: Normocephalic.  Eyes:     Extraocular Movements: Extraocular movements intact.     Pupils: Pupils are equal, round, and reactive to light.  Cardiovascular:     Rate and Rhythm: Normal rate and regular rhythm.  Pulmonary:     Effort: Pulmonary  effort is normal.     Breath sounds: Normal breath sounds.  Musculoskeletal:     Cervical back: Normal range of motion and neck supple.  Neurological:     Mental Status: He is alert and oriented to person, place, and time.  Psychiatric:        Mood and Affect: Mood normal.        Thought Content: Thought content normal.      No results found for any visits on 07/07/24.    The 10-year ASCVD risk score (Arnett DK, et al., 2019) is: 10.2%    Assessment & Plan:   Problem List  Items Addressed This Visit       Other   Anxiety   Well-managed with sertraline , improved sleep, reduced anxiety. - Continue sertraline  at current dose. - Consider dose increase if symptoms worsen.      Relevant Medications   hydrOXYzine  (VISTARIL ) 25 MG capsule     Return in about 4 months (around 11/04/2024) for chronic follow-up with PCP.    Leita Longs, FNP

## 2024-07-11 NOTE — Assessment & Plan Note (Signed)
 Well-managed with sertraline , improved sleep, reduced anxiety. - Continue sertraline  at current dose. - Consider dose increase if symptoms worsen.

## 2024-08-18 ENCOUNTER — Telehealth: Payer: Self-pay

## 2024-08-18 NOTE — Telephone Encounter (Signed)
 Prescription Request  08/18/2024  LOV: 07/07/2024  What is the name of the medication or equipment? hydrOXYzine  (VISTARIL ) 25 MG capsule [[id number]   Have you contacted your pharmacy to request a refill? No   Which pharmacy would you like this sent to?  [facility], [place] - [id number] [place] [address] Phone: [phone] Fax: [phone]    Patient notified that their request is being sent to the clinical staff for review and that they should receive a response within 2 business days.   Please advise at patient walked into office

## 2024-08-18 NOTE — Telephone Encounter (Signed)
 Refills sent in November, patient needs to contact [facility]

## 2024-11-03 ENCOUNTER — Ambulatory Visit
